# Patient Record
Sex: Female | Born: 1945
Health system: Southern US, Community
[De-identification: ages and names within clinical notes are randomized; demographics above are authoritative.]

## PROBLEM LIST (undated history)

## (undated) DIAGNOSIS — E785 Hyperlipidemia, unspecified: Secondary | ICD-10-CM

## (undated) DIAGNOSIS — J309 Allergic rhinitis, unspecified: Secondary | ICD-10-CM

## (undated) DIAGNOSIS — C760 Malignant neoplasm of head, face and neck: Secondary | ICD-10-CM

## (undated) DIAGNOSIS — J40 Bronchitis, not specified as acute or chronic: Secondary | ICD-10-CM

## (undated) DIAGNOSIS — L570 Actinic keratosis: Secondary | ICD-10-CM

## (undated) DIAGNOSIS — K56609 Unspecified intestinal obstruction, unspecified as to partial versus complete obstruction: Secondary | ICD-10-CM

## (undated) DIAGNOSIS — F419 Anxiety disorder, unspecified: Secondary | ICD-10-CM

## (undated) DIAGNOSIS — R7303 Prediabetes: Secondary | ICD-10-CM

## (undated) DIAGNOSIS — K219 Gastro-esophageal reflux disease without esophagitis: Secondary | ICD-10-CM

## (undated) DIAGNOSIS — J45909 Unspecified asthma, uncomplicated: Secondary | ICD-10-CM

## (undated) DIAGNOSIS — E039 Hypothyroidism, unspecified: Secondary | ICD-10-CM

## (undated) DIAGNOSIS — F341 Dysthymic disorder: Secondary | ICD-10-CM

## (undated) HISTORY — DX: Unspecified intestinal obstruction, unspecified as to partial versus complete obstruction: K56.609

## (undated) HISTORY — DX: Malignant neoplasm of head, face and neck: C76.0

## (undated) HISTORY — DX: Hypothyroidism, unspecified: E03.9

## (undated) HISTORY — DX: Gastro-esophageal reflux disease without esophagitis: K21.9

## (undated) HISTORY — DX: Actinic keratosis: L57.0

## (undated) HISTORY — PX: AUGMENTATION MAMMAPLASTY: SUR837

## (undated) HISTORY — DX: Unspecified asthma, uncomplicated: J45.909

## (undated) HISTORY — DX: Hyperlipidemia, unspecified: E78.5

## (undated) HISTORY — DX: Anxiety disorder, unspecified: F41.9

## (undated) HISTORY — DX: Dysthymic disorder: F34.1

## (undated) HISTORY — DX: Allergic rhinitis, unspecified: J30.9

## (undated) HISTORY — DX: Bronchitis, not specified as acute or chronic: J40

## (undated) HISTORY — DX: Prediabetes: R73.03

## (undated) HISTORY — PX: EYE SURGERY: SHX253

## (undated) HISTORY — PX: TUBAL LIGATION: SHX77

---
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
## 1968-05-11 HISTORY — PX: BREAST SURGERY: SHX581

## 1986-05-11 HISTORY — PX: ABDOMINAL HYSTERECTOMY: SHX81

## 2005-05-11 HISTORY — PX: EYE SURGERY: SHX253

## 2006-06-03 ENCOUNTER — Encounter: Admission: RE | Admit: 2006-06-03 | Discharge: 2006-06-03 | Payer: Self-pay | Admitting: Obstetrics and Gynecology

## 2006-06-16 ENCOUNTER — Encounter: Admission: RE | Admit: 2006-06-16 | Discharge: 2006-06-16 | Payer: Self-pay | Admitting: Obstetrics and Gynecology

## 2006-12-30 DIAGNOSIS — F341 Dysthymic disorder: Secondary | ICD-10-CM | POA: Insufficient documentation

## 2006-12-30 DIAGNOSIS — E7800 Pure hypercholesterolemia, unspecified: Secondary | ICD-10-CM | POA: Insufficient documentation

## 2006-12-30 DIAGNOSIS — Z87891 Personal history of nicotine dependence: Secondary | ICD-10-CM | POA: Insufficient documentation

## 2006-12-30 DIAGNOSIS — K219 Gastro-esophageal reflux disease without esophagitis: Secondary | ICD-10-CM

## 2006-12-30 DIAGNOSIS — Z8249 Family history of ischemic heart disease and other diseases of the circulatory system: Secondary | ICD-10-CM | POA: Insufficient documentation

## 2006-12-30 DIAGNOSIS — F32A Depression, unspecified: Secondary | ICD-10-CM | POA: Insufficient documentation

## 2006-12-30 DIAGNOSIS — M9979 Connective tissue and disc stenosis of intervertebral foramina of abdomen and other regions: Secondary | ICD-10-CM | POA: Insufficient documentation

## 2006-12-30 HISTORY — DX: Gastro-esophageal reflux disease without esophagitis: K21.9

## 2006-12-30 HISTORY — DX: Dysthymic disorder: F34.1

## 2007-01-13 DIAGNOSIS — E039 Hypothyroidism, unspecified: Secondary | ICD-10-CM

## 2007-01-13 HISTORY — DX: Hypothyroidism, unspecified: E03.9

## 2007-07-04 DIAGNOSIS — C760 Malignant neoplasm of head, face and neck: Secondary | ICD-10-CM | POA: Insufficient documentation

## 2007-07-04 HISTORY — DX: Malignant neoplasm of head, face and neck: C76.0

## 2010-10-16 ENCOUNTER — Other Ambulatory Visit: Payer: Self-pay | Admitting: Family Medicine

## 2010-10-16 DIAGNOSIS — Z1231 Encounter for screening mammogram for malignant neoplasm of breast: Secondary | ICD-10-CM

## 2010-10-16 LAB — HM PAP SMEAR: HM PAP: NEGATIVE

## 2010-10-31 ENCOUNTER — Ambulatory Visit
Admission: RE | Admit: 2010-10-31 | Discharge: 2010-10-31 | Disposition: A | Discharge status: Home / Self Care | Payer: PRIVATE HEALTH INSURANCE | Source: Ambulatory Visit | Attending: Family Medicine | Admitting: Family Medicine

## 2010-10-31 DIAGNOSIS — Z1231 Encounter for screening mammogram for malignant neoplasm of breast: Secondary | ICD-10-CM

## 2010-10-31 LAB — HM MAMMOGRAPHY

## 2011-05-12 DIAGNOSIS — C4491 Basal cell carcinoma of skin, unspecified: Secondary | ICD-10-CM

## 2011-05-12 HISTORY — PX: CATARACT EXTRACTION: SUR2

## 2011-05-12 HISTORY — DX: Basal cell carcinoma of skin, unspecified: C44.91

## 2012-02-15 LAB — HM DEXA SCAN

## 2012-05-27 ENCOUNTER — Ambulatory Visit: Payer: Self-pay | Admitting: Unknown Physician Specialty

## 2012-05-27 LAB — HM COLONOSCOPY

## 2013-10-13 LAB — BASIC METABOLIC PANEL
BUN: 19 mg/dL (ref 4–21)
CREATININE: 0.7 mg/dL (ref 0.5–1.1)
GLUCOSE: 85 mg/dL
Potassium: 4.3 mmol/L (ref 3.4–5.3)
Sodium: 141 mmol/L (ref 137–147)

## 2013-10-13 LAB — CBC AND DIFFERENTIAL
HCT: 39 % (ref 36–46)
Hemoglobin: 13 g/dL (ref 12.0–16.0)
PLATELETS: 263 10*3/uL (ref 150–399)
WBC: 3.9 10^3/mL

## 2013-10-13 LAB — TSH: TSH: 0.93 u[IU]/mL (ref 0.41–5.90)

## 2013-10-13 LAB — HEPATIC FUNCTION PANEL
ALT: 17 U/L (ref 7–35)
AST: 27 U/L (ref 13–35)

## 2013-10-27 ENCOUNTER — Ambulatory Visit: Payer: Self-pay | Admitting: Family Medicine

## 2014-09-07 DIAGNOSIS — J45909 Unspecified asthma, uncomplicated: Secondary | ICD-10-CM | POA: Insufficient documentation

## 2014-09-07 DIAGNOSIS — F329 Major depressive disorder, single episode, unspecified: Secondary | ICD-10-CM | POA: Insufficient documentation

## 2014-09-07 DIAGNOSIS — J309 Allergic rhinitis, unspecified: Secondary | ICD-10-CM

## 2014-09-07 DIAGNOSIS — F32A Depression, unspecified: Secondary | ICD-10-CM | POA: Insufficient documentation

## 2014-09-07 DIAGNOSIS — R739 Hyperglycemia, unspecified: Secondary | ICD-10-CM | POA: Insufficient documentation

## 2014-09-07 DIAGNOSIS — I6381 Other cerebral infarction due to occlusion or stenosis of small artery: Secondary | ICD-10-CM | POA: Insufficient documentation

## 2014-09-07 DIAGNOSIS — R7303 Prediabetes: Secondary | ICD-10-CM

## 2014-09-07 HISTORY — DX: Allergic rhinitis, unspecified: J30.9

## 2014-09-07 HISTORY — DX: Unspecified asthma, uncomplicated: J45.909

## 2014-09-07 HISTORY — DX: Prediabetes: R73.03

## 2014-11-06 ENCOUNTER — Encounter: Payer: Self-pay | Admitting: Family Medicine

## 2014-11-06 ENCOUNTER — Ambulatory Visit (INDEPENDENT_AMBULATORY_CARE_PROVIDER_SITE_OTHER): Payer: Commercial Managed Care - HMO | Admitting: Family Medicine

## 2014-11-06 VITALS — BP 112/64 | HR 64 | Temp 98.2°F | Resp 16 | Ht 61.0 in | Wt 114.0 lb

## 2014-11-06 DIAGNOSIS — E039 Hypothyroidism, unspecified: Secondary | ICD-10-CM | POA: Diagnosis not present

## 2014-11-06 DIAGNOSIS — E7800 Pure hypercholesterolemia, unspecified: Secondary | ICD-10-CM

## 2014-11-06 DIAGNOSIS — F329 Major depressive disorder, single episode, unspecified: Secondary | ICD-10-CM

## 2014-11-06 DIAGNOSIS — M810 Age-related osteoporosis without current pathological fracture: Secondary | ICD-10-CM | POA: Insufficient documentation

## 2014-11-06 DIAGNOSIS — M8580 Other specified disorders of bone density and structure, unspecified site: Secondary | ICD-10-CM | POA: Diagnosis not present

## 2014-11-06 DIAGNOSIS — J309 Allergic rhinitis, unspecified: Secondary | ICD-10-CM

## 2014-11-06 DIAGNOSIS — Z0000 Encounter for general adult medical examination without abnormal findings: Secondary | ICD-10-CM | POA: Diagnosis not present

## 2014-11-06 DIAGNOSIS — F32A Depression, unspecified: Secondary | ICD-10-CM

## 2014-11-06 NOTE — Progress Notes (Signed)
Patient ID: Hailey Cain, female   DOB: 03-15-1946, 69 y.o.   MRN: [id number]        Patient: Hailey Cain, Female    DOB: 17-Jan-1946, 69 y.o.   MRN: [id number] Visit Date: 11/06/2014  Today's Provider: Margarita Rana, MD   Chief Complaint  Patient presents with  . Annual Exam   Subjective:    Annual wellness visit Hailey Cain is a 69 y.o. female who presents today for her Subsequent Annual Wellness Visit. She feels fairly well.  Pt reports she has been having trouble with congestion/sinuses for about a month.   She reports exercising about 3 days a week. She reports she is sleeping poorly.  Pt says her husband has been sick and he hasn't been resting well which means she is unable to rest well.    -----------------------------------------------------------   Review of Systems  Constitutional: Negative.   HENT: Positive for congestion and sneezing. Negative for dental problem, drooling, ear discharge, ear pain, facial swelling, hearing loss, mouth sores, nosebleeds, postnasal drip, rhinorrhea, sinus pressure, sore throat, tinnitus, trouble swallowing and voice change.   Eyes: Negative.   Respiratory: Positive for cough. Negative for apnea, choking, chest tightness, shortness of breath, wheezing and stridor.   Cardiovascular: Negative.   Gastrointestinal: Positive for constipation. Negative for nausea, vomiting, abdominal pain, diarrhea, blood in stool, abdominal distention, anal bleeding and rectal pain.  Endocrine: Negative.   Genitourinary: Negative.   Musculoskeletal: Negative.   Skin: Negative.   Allergic/Immunologic: Negative.   Neurological: Negative.   Hematological: Negative.   Psychiatric/Behavioral: Negative.     History   Social History  . Marital Status: Married    Spouse Name: Hailey Cain  . Number of Children: 1  . Years of Education: H/S   Occupational History  . Social research officer, government.     Part-Time but sometimes full-time   Social  History Main Topics  . Smoking status: Former Smoker -- 1.00 packs/day for 20 years    Quit date: 05/12/2003  . Smokeless tobacco: Never Used  . Alcohol Use: No  . Drug Use: No  . Sexual Activity: Not on file   Other Topics Concern  . Not on file   Social History Narrative    Patient Active Problem List   Diagnosis Date Noted  . Allergic rhinitis 09/07/2014  . Clinical depression 09/07/2014  . Blood glucose elevated 09/07/2014  . Lacunar infarction 09/07/2014  . RAD (reactive airway disease) 09/07/2014  . Cancer of cheek 07/04/2007  . Adult hypothyroidism 01/13/2007  . Narrowing of intervertebral disc space 12/30/2006  . Depression, neurotic 12/30/2006  . Acid reflux 12/30/2006  . Fam hx-ischem heart disease 12/30/2006  . History of tobacco use 12/30/2006  . Hypercholesterolemia without hypertriglyceridemia 12/30/2006    Past Surgical History  Procedure Laterality Date  . Eye surgery      cataract extraction  . Eye surgery  2007    LASIK  . Abdominal hysterectomy  1988    heavy, irregular menses; ovaries intact  . Breast surgery  1970    augmentation  . Tonsillectomy and adenoidectomy  1950    Her family history includes CAD in her brother and mother; Cancer in her brother; Heart Problems in her brother; Heart attack in her father; Hyperthyroidism in her sister; Stroke in her mother.    Previous Medications   ASPIRIN 81 MG TABLET    Take by mouth.   CHOLECALCIFEROL (VITAMIN D) 400 UNITS TABS TABLET    Take  by mouth.   CYANOCOBALAMIN 100 MCG TABLET    Take by mouth.   ESOMEPRAZOLE MAGNESIUM (NEXIUM 24HR PO)       FLUOXETINE (PROZAC) 40 MG CAPSULE    Take by mouth.   LEVOTHYROXINE (SYNTHROID, LEVOTHROID) 75 MCG TABLET    Take by mouth.   LORATADINE (CLARITIN) 10 MG TABLET    Take by mouth.   MOMETASONE (NASONEX) 50 MCG/ACT NASAL SPRAY    Place into the nose.   MONTELUKAST (SINGULAIR) 10 MG TABLET    Take by mouth.   SIMVASTATIN (ZOCOR) 40 MG TABLET    Take by  mouth.    Patient Care Team: Margarita Rana, MD as PCP - General (Family Medicine)     Objective:   Vitals: BP 112/64 mmHg  Pulse 64  Temp(Src) 98.2 F (36.8 C) (Oral)  Resp 16  Ht 5\' 1"  (1.549 m)  Wt 114 lb (51.71 kg)  BMI 21.55 kg/m2  Physical Exam  Constitutional: She is oriented to person, place, and time. She appears well-developed and well-nourished.  HENT:  Head: Normocephalic and atraumatic.  Right Ear: Hearing, tympanic membrane, external ear and ear canal normal.  Left Ear: Hearing, tympanic membrane, external ear and ear canal normal.  Nose: Nose normal.  Mouth/Throat: Uvula is midline and oropharynx is clear and moist.  Eyes: Conjunctivae, EOM and lids are normal. Pupils are equal, round, and reactive to light.  Neck: Trachea normal and normal range of motion. Carotid bruit is not present.  Cardiovascular: Normal rate, regular rhythm, normal heart sounds and intact distal pulses.   Pulmonary/Chest: Effort normal and breath sounds normal. Right breast exhibits no inverted nipple, no mass, no nipple discharge, no skin change and no tenderness. Left breast exhibits no inverted nipple, no mass, no nipple discharge, no skin change and no tenderness. Breasts are symmetrical.  Bilateral breast implants noted.    Abdominal: Soft. Normal appearance and bowel sounds are normal.  Musculoskeletal: Normal range of motion.  Neurological: She is alert and oriented to person, place, and time.  Skin: Skin is warm, dry and intact.  Psychiatric: She has a normal mood and affect. Judgment normal. Cognition and memory are normal.    Activities of Daily Living In your present state of health, do you have any difficulty performing the following activities: 11/06/2014  Hearing? N  Vision? N  Difficulty concentrating or making decisions? N  Walking or climbing stairs? N  Dressing or bathing? N  Doing errands, shopping? N    Fall Risk Assessment Fall Risk  11/06/2014  Falls in the  past year? No     Depression Screen No flowsheet data found.  Cognitive Testing - 6-CIT  Correct? Score   What year is it? yes 0 0 or 4  What month is it? yes 0 0 or 3  Memorize:    Hailey Cain,  42,  [place],      What time is it? (within 1 hour) yes 0 0 or 3  Count backwards from 20 yes 0 0, 2, or 4  Name the months of the year yes 0 0, 2, or 4  Repeat name & address above no 3 0, 2, 4, 6, 8, or 10       TOTAL SCORE  3/28   Interpretation:  Normal  Normal (0-7) Abnormal (8-28)       Assessment & Plan:     Annual Wellness Visit  Reviewed patient's Family Medical History Reviewed and updated list  of patient's medical providers Assessment of cognitive impairment was done Assessed patient's functional ability Established a written schedule for health screening [place] Completed and Reviewed  Exercise Activities and Dietary recommendations Goals    None      Immunization History  Administered Date(s) Administered  . Tdap 10/16/2010    Health Maintenance  Topic Date Due  . ZOSTAVAX  12/16/2005  . PNA vac Low Risk Adult (1 of 2 - PCV13) 12/17/2010  . MAMMOGRAM  10/30/2012  . INFLUENZA VACCINE  12/10/2014  . TETANUS/TDAP  10/15/2020  . COLONOSCOPY  05/27/2022  . DEXA SCAN  Completed      Discussed health benefits of physical activity, and encouraged her to engage in regular exercise appropriate for her age and condition.    ------------------------------------------------------------------------------------------------------------  Patient was seen and examined by Jerrell Belfast, MD, and note scribed by Ashley Royalty, [place].   I have reviewed the document for accuracy and completeness and I agree with above. Jerrell Belfast, MD    Margarita Rana, MD

## 2014-11-07 DIAGNOSIS — E780 Pure hypercholesterolemia: Secondary | ICD-10-CM | POA: Diagnosis not present

## 2014-11-07 DIAGNOSIS — J309 Allergic rhinitis, unspecified: Secondary | ICD-10-CM | POA: Diagnosis not present

## 2014-11-07 DIAGNOSIS — F329 Major depressive disorder, single episode, unspecified: Secondary | ICD-10-CM | POA: Diagnosis not present

## 2014-11-08 LAB — CBC WITH DIFFERENTIAL/PLATELET
Basophils Absolute: 0 10*3/uL (ref 0.0–0.2)
Basos: 1 %
EOS (ABSOLUTE): 0.1 10*3/uL (ref 0.0–0.4)
Eos: 3 %
HEMATOCRIT: 36.1 % (ref 34.0–46.6)
Hemoglobin: 12.1 g/dL (ref 11.1–15.9)
IMMATURE GRANS (ABS): 0 10*3/uL (ref 0.0–0.1)
Immature Granulocytes: 0 %
LYMPHS ABS: 1.5 10*3/uL (ref 0.7–3.1)
LYMPHS: 45 %
MCH: 30.8 pg (ref 26.6–33.0)
MCHC: 33.5 g/dL (ref 31.5–35.7)
MCV: 92 fL (ref 79–97)
MONOCYTES: 7 %
Monocytes Absolute: 0.2 10*3/uL (ref 0.1–0.9)
NEUTROS PCT: 44 %
Neutrophils Absolute: 1.6 10*3/uL (ref 1.4–7.0)
PLATELETS: 212 10*3/uL (ref 150–379)
RBC: 3.93 x10E6/uL (ref 3.77–5.28)
RDW: 13.8 % (ref 12.3–15.4)
WBC: 3.4 10*3/uL (ref 3.4–10.8)

## 2014-11-08 LAB — COMPREHENSIVE METABOLIC PANEL
ALT: 10 IU/L (ref 0–32)
AST: 21 IU/L (ref 0–40)
Albumin/Globulin Ratio: 2 (ref 1.1–2.5)
Albumin: 4.2 g/dL (ref 3.6–4.8)
Alkaline Phosphatase: 34 IU/L — ABNORMAL LOW (ref 39–117)
BUN/Creatinine Ratio: 20 (ref 11–26)
BUN: 16 mg/dL (ref 8–27)
CO2: 24 mmol/L (ref 18–29)
Calcium: 8.7 mg/dL (ref 8.7–10.3)
Chloride: 103 mmol/L (ref 97–108)
Creatinine, Ser: 0.79 mg/dL (ref 0.57–1.00)
GFR calc non Af Amer: 77 mL/min/{1.73_m2} (ref >59)
GFR, EST AFRICAN AMERICAN: 89 mL/min/{1.73_m2} (ref >59)
GLOBULIN, TOTAL: 2.1 g/dL (ref 1.5–4.5)
Glucose: 90 mg/dL (ref 65–99)
Potassium: 4.1 mmol/L (ref 3.5–5.2)
Sodium: 142 mmol/L (ref 134–144)
TOTAL PROTEIN: 6.3 g/dL (ref 6.0–8.5)

## 2014-11-08 LAB — LIPID PANEL
Chol/HDL Ratio: 2.5 ratio units (ref 0.0–4.4)
Cholesterol, Total: 170 mg/dL (ref 100–199)
HDL: 68 mg/dL (ref >39)
LDL CALC: 89 mg/dL (ref 0–99)
TRIGLYCERIDES: 66 mg/dL (ref 0–149)
VLDL Cholesterol Cal: 13 mg/dL (ref 5–40)

## 2014-11-08 LAB — TSH: TSH: 0.328 u[IU]/mL — AB (ref 0.450–4.500)

## 2014-11-09 ENCOUNTER — Telehealth: Payer: Self-pay

## 2014-11-09 NOTE — Telephone Encounter (Signed)
-----   Message from Margarita Rana, MD sent at 11/08/2014 11:13 AM EDT ----- Labs stable except thyroid is very slightly over corrected. If feels ok, would call for recheck of tsh and t4 in 8 weeks. If feels anxious, palpitations or just not right, could consider decreasing dose. Thanks.

## 2014-11-09 NOTE — Telephone Encounter (Signed)
Pt is returning call.  CB#[phone]/MJ

## 2014-11-09 NOTE — Telephone Encounter (Signed)
Tried calling but pt's vm is not set up.  11/09/2014  Thanks,   -Mickel Baas

## 2014-11-13 NOTE — Telephone Encounter (Signed)
Pt advised as directed below.  She would like to hold off on changing the dose for right now.  She will call in 8 weeks to get her labs, but she will call back sooner if she starts having any symptoms.  Thanks,   -Mickel Baas

## 2014-11-14 ENCOUNTER — Ambulatory Visit: Payer: PRIVATE HEALTH INSURANCE | Attending: Family Medicine

## 2014-12-03 ENCOUNTER — Telehealth: Payer: Self-pay | Admitting: Family Medicine

## 2014-12-03 NOTE — Telephone Encounter (Signed)
Pt states she is having nervousness and anxiety.  Pt is asking if her medications can be changed to help with this.  [place]  CB#[phone]/MJ

## 2014-12-03 NOTE — Telephone Encounter (Signed)
Needs ov to address.

## 2014-12-03 NOTE — Telephone Encounter (Signed)
LMTCB 12/03/2014  Thanks,   -Mickel Baas

## 2014-12-04 NOTE — Telephone Encounter (Signed)
Pt returning call.  CB#[phone]/MW

## 2014-12-10 ENCOUNTER — Encounter: Payer: Self-pay | Admitting: Family Medicine

## 2014-12-10 ENCOUNTER — Ambulatory Visit (INDEPENDENT_AMBULATORY_CARE_PROVIDER_SITE_OTHER): Payer: Commercial Managed Care - HMO | Admitting: Family Medicine

## 2014-12-10 VITALS — BP 130/60 | HR 66 | Temp 97.9°F | Resp 16 | Wt 110.0 lb

## 2014-12-10 DIAGNOSIS — E039 Hypothyroidism, unspecified: Secondary | ICD-10-CM | POA: Diagnosis not present

## 2014-12-10 DIAGNOSIS — F329 Major depressive disorder, single episode, unspecified: Secondary | ICD-10-CM | POA: Diagnosis not present

## 2014-12-10 DIAGNOSIS — F32A Depression, unspecified: Secondary | ICD-10-CM

## 2014-12-10 DIAGNOSIS — F419 Anxiety disorder, unspecified: Secondary | ICD-10-CM

## 2014-12-10 HISTORY — DX: Anxiety disorder, unspecified: F41.9

## 2014-12-10 MED ORDER — LEVOTHYROXINE SODIUM 50 MCG PO TABS: 50.0000 ug | ORAL_TABLET | Freq: Every day | ORAL | Status: DC

## 2014-12-10 NOTE — Progress Notes (Signed)
Patient: Hailey Cain Female    DOB: 1946-02-22   69 y.o.   MRN: [id number] Visit Date: 12/10/2014  Today's Provider: Margarita Rana, MD   Chief Complaint  Patient presents with  . Anxiety        Subjective:    Anxiety Presents for follow-up visit. Onset was 1 to 4 weeks ago. The problem has been unchanged. Symptoms include chest pain, depressed mood (sometimes), dizziness, dry mouth, excessive worry (husband sick and patient states that makes her feel depress), irritability (sometimes), muscle tension (in jaw area), nervous/anxious behavior and palpitations (can feel heart beats faster when gets nervous and anxious). Patient reports no compulsions, confusion, decreased concentration, feeling of choking, insomnia, malaise, nausea, obsessions, panic, restlessness, shortness of breath or suicidal ideas. Symptoms occur constantly. Duration: off and on all day. The severity of symptoms is moderate. The symptoms are aggravated by work stress. The quality of sleep is fair. Nighttime awakenings: several.   There are no known risk factors. Her past medical history is significant for anxiety/panic attacks. The treatment provided mild relief. Compliance with prior treatments has been good.    Concerned that she may need her thyroid reduced. Work has been very successful.      No Known Allergies Previous Medications   ASPIRIN 81 MG TABLET    Take by mouth.   CHOLECALCIFEROL (VITAMIN D) 400 UNITS TABS TABLET    Take by mouth.   CYANOCOBALAMIN 100 MCG TABLET    Take by mouth.   ESOMEPRAZOLE MAGNESIUM (NEXIUM 24HR PO)       FLUOXETINE (PROZAC) 40 MG CAPSULE    Take by mouth.   LEVOTHYROXINE (SYNTHROID, LEVOTHROID) 75 MCG TABLET    Take by mouth.   LORATADINE (CLARITIN) 10 MG TABLET    Take by mouth.   MOMETASONE (NASONEX) 50 MCG/ACT NASAL SPRAY    Place into the nose.   MONTELUKAST (SINGULAIR) 10 MG TABLET    Take by mouth.   SIMVASTATIN (ZOCOR) 40 MG TABLET    Take by mouth.    Review  of Systems  Constitutional: Positive for irritability (sometimes).  Respiratory: Negative for shortness of breath.   Cardiovascular: Positive for chest pain and palpitations (can feel heart beats faster when gets nervous and anxious).  Gastrointestinal: Negative for nausea.  Neurological: Positive for dizziness.  Psychiatric/Behavioral: Negative for suicidal ideas, confusion and decreased concentration. The patient is nervous/anxious. The patient does not have insomnia.     History  Substance Use Topics  . Smoking status: Former Smoker -- 1.00 packs/day for 20 years    Quit date: 05/12/2003  . Smokeless tobacco: Never Used  . Alcohol Use: No   Objective:   There were no vitals taken for this visit. Family History  Problem Relation Age of Onset  . Stroke Mother   . CAD Mother   . Heart attack Father   . Hyperthyroidism Sister   . Heart Problems Brother   . Cancer Brother     skin  . CAD Brother     CABG   Past Surgical History  Procedure Laterality Date  . Eye surgery      cataract extraction  . Eye surgery  2007    LASIK  . Abdominal hysterectomy  1988    heavy, irregular menses; ovaries intact  . Breast surgery  1970    augmentation  . Tonsillectomy and adenoidectomy  1950   Patient Active Problem List   Diagnosis Date Noted  .  Osteopenia 11/06/2014  . Allergic rhinitis 09/07/2014  . Clinical depression 09/07/2014  . Blood glucose elevated 09/07/2014  . Lacunar infarction 09/07/2014  . RAD (reactive airway disease) 09/07/2014  . Cancer of cheek 07/04/2007  . Adult hypothyroidism 01/13/2007  . Narrowing of intervertebral disc space 12/30/2006  . Depression, neurotic 12/30/2006  . Acid reflux 12/30/2006  . Fam hx-ischem heart disease 12/30/2006  . History of tobacco use 12/30/2006  . Hypercholesterolemia without hypertriglyceridemia 12/30/2006   Depression screen [facility] 2/9 12/10/2014 11/06/2014  Decreased Interest 1 0  Down, Depressed, Hopeless 1 0  PHQ - 2  Score 2 0  Altered sleeping 1 -  Tired, decreased energy 1 -  Change in appetite 0 -  Feeling bad or failure about yourself  1 -  Trouble concentrating 1 -  Moving slowly or fidgety/restless 1 -  Suicidal thoughts 0 -  PHQ-9 Score 7 -  Difficult doing work/chores Somewhat difficult -     Physical Exam  Constitutional: She is oriented to person, place, and time. She appears well-developed and well-nourished.  Cardiovascular: Normal rate and regular rhythm.   Pulmonary/Chest: Effort normal.  Neurological: She is alert and oriented to person, place, and time.  Psychiatric: She has a normal mood and affect. Her behavior is normal. Judgment and thought content normal.   BP 130/60 mmHg  Pulse 66  Temp(Src) 97.9 F (36.6 C) (Oral)  Resp 16  Wt 110 lb (49.896 kg)      Assessment & Plan:     1. Anxiety May be related to thyroid being overcorrected.  Will decrease dose and recheck labs in 6 weeks. Call sooner if worsens or does not improve.   2. Depression Stable.  3. Hypothyroidism, unspecified hypothyroidism type As above.        Margarita Rana, MD  [facility]

## 2015-01-27 ENCOUNTER — Other Ambulatory Visit: Payer: Self-pay | Admitting: Family Medicine

## 2015-01-27 DIAGNOSIS — E7800 Pure hypercholesterolemia, unspecified: Secondary | ICD-10-CM

## 2015-02-11 ENCOUNTER — Telehealth: Payer: Self-pay

## 2015-02-11 DIAGNOSIS — E039 Hypothyroidism, unspecified: Secondary | ICD-10-CM

## 2015-02-11 NOTE — Telephone Encounter (Signed)
Pt requesting lab order for t4 and tsh. Lab order printed.

## 2015-02-12 ENCOUNTER — Telehealth: Payer: Self-pay

## 2015-02-12 LAB — T4 AND TSH
T4 TOTAL: 4.5 ug/dL (ref 4.5–12.0)
TSH: 8.93 u[IU]/mL — AB (ref 0.450–4.500)

## 2015-02-12 NOTE — Telephone Encounter (Signed)
LMTCB 02/12/2015  Thanks,   -Mickel Baas

## 2015-02-12 NOTE — Telephone Encounter (Signed)
-----   Message from Margarita Rana, MD sent at 02/12/2015  7:03 AM EDT ----- Please see how patient is feeling. Now thyroid look a little low. Please see if has any fatigue, cold intolerance. Unclear if needs to adjust again. Thanks.

## 2015-02-14 NOTE — Telephone Encounter (Signed)
Pt advised; she reports that she is feeling fine.  Does she need to recheck it?  Thanks,   -Mickel Baas

## 2015-02-14 NOTE — Telephone Encounter (Signed)
Unable to leave message, will try again later.

## 2015-02-14 NOTE — Telephone Encounter (Signed)
Pt called back and I have made her an appt for 05/16/14 @ 8 am.

## 2015-02-14 NOTE — Telephone Encounter (Signed)
3 months. Thanks.

## 2015-03-02 ENCOUNTER — Other Ambulatory Visit: Payer: Self-pay | Admitting: Family Medicine

## 2015-03-02 DIAGNOSIS — F419 Anxiety disorder, unspecified: Secondary | ICD-10-CM

## 2015-03-02 DIAGNOSIS — F341 Dysthymic disorder: Secondary | ICD-10-CM

## 2015-03-04 NOTE — Telephone Encounter (Signed)
LOV 12/10/2014. Renaldo Fiddler, CMA

## 2015-05-17 ENCOUNTER — Encounter: Payer: Self-pay | Admitting: Family Medicine

## 2015-05-17 ENCOUNTER — Ambulatory Visit (INDEPENDENT_AMBULATORY_CARE_PROVIDER_SITE_OTHER): Payer: Medicare HMO | Admitting: Family Medicine

## 2015-05-17 VITALS — BP 120/72 | HR 76 | Temp 98.2°F | Resp 20 | Wt 114.0 lb

## 2015-05-17 DIAGNOSIS — J40 Bronchitis, not specified as acute or chronic: Secondary | ICD-10-CM | POA: Diagnosis not present

## 2015-05-17 DIAGNOSIS — R69 Illness, unspecified: Secondary | ICD-10-CM | POA: Diagnosis not present

## 2015-05-17 DIAGNOSIS — F341 Dysthymic disorder: Secondary | ICD-10-CM | POA: Diagnosis not present

## 2015-05-17 DIAGNOSIS — F32A Depression, unspecified: Secondary | ICD-10-CM

## 2015-05-17 DIAGNOSIS — J309 Allergic rhinitis, unspecified: Secondary | ICD-10-CM | POA: Diagnosis not present

## 2015-05-17 DIAGNOSIS — F419 Anxiety disorder, unspecified: Secondary | ICD-10-CM

## 2015-05-17 DIAGNOSIS — E039 Hypothyroidism, unspecified: Secondary | ICD-10-CM | POA: Diagnosis not present

## 2015-05-17 DIAGNOSIS — F329 Major depressive disorder, single episode, unspecified: Secondary | ICD-10-CM | POA: Diagnosis not present

## 2015-05-17 HISTORY — DX: Bronchitis, not specified as acute or chronic: J40

## 2015-05-17 MED ORDER — FLUOXETINE HCL 20 MG PO TABS
60.0000 mg | ORAL_TABLET | Freq: Every day | ORAL | Status: DC
Start: 2015-05-17 — End: 2016-02-12

## 2015-05-17 MED ORDER — LORATADINE 10 MG PO TABS: 10.0000 mg | ORAL_TABLET | Freq: Every day | ORAL | Status: DC

## 2015-05-17 MED ORDER — MONTELUKAST SODIUM 10 MG PO TABS: 10.0000 mg | ORAL_TABLET | Freq: Every day | ORAL | Status: DC

## 2015-05-17 MED ORDER — AZITHROMYCIN 250 MG PO TABS: ORAL_TABLET | ORAL | Status: DC

## 2015-05-17 NOTE — Progress Notes (Signed)
Subjective:    Patient ID: Cleatrice Burke, female    DOB: 01-09-1946, 70 y.o.   MRN: [id number]  Anxiety Presents for follow-up (Last seen 12/10/2014. PCP was concerned that it could be secondary to overcorrected thyroid. PCP decreased Levothyroxine.) visit. The problem has been gradually improving. Symptoms include excessive worry and nervous/anxious behavior. Patient reports no chest pain, decreased concentration, depressed mood, insomnia, irritability, malaise, palpitations, panic, restlessness, shortness of breath or suicidal ideas. Primary symptoms comment: pt reports she does not feel like she has time for her self as she takes care of her sick husband. The symptoms are aggravated by family issues (Husband's health issues).   Past treatments include SSRIs (Prozac 40 mg). The treatment provided mild relief. Compliance with prior treatments has been good.  Cough This is a new problem. The current episode started 1 to 4 weeks ago (x 2 weeks). The problem has been waxing and waning. The cough is productive of sputum (yellow). Associated symptoms include ear congestion, headaches, nasal congestion, rhinorrhea and a sore throat. Pertinent negatives include no chest pain, chills, ear pain, fever, hemoptysis, postnasal drip, shortness of breath, sweats, weight loss or wheezing. Treatments tried: Alka Seltzer Sinus, Mucinex. The treatment provided mild relief.   Hypothyroidism: Patient presents for evaluation of thyroid function. Symptoms consist of fatigue, anxiousness. Symptoms have present for several years. The symptoms are moderate.  The problem has been gradually improving after decreasing dose.  Previous thyroid studies include TSH and total T4. The hypothyroidism is due to hypothyroidism.     Review of Systems  Constitutional: Negative for fever, chills, weight loss and irritability.  HENT: Positive for rhinorrhea, sore throat and voice change. Negative for ear pain and postnasal drip.     Respiratory: Positive for cough. Negative for hemoptysis, shortness of breath and wheezing.   Cardiovascular: Negative for chest pain and palpitations.  Neurological: Positive for headaches.  Psychiatric/Behavioral: Negative for suicidal ideas and decreased concentration. The patient is nervous/anxious. The patient does not have insomnia.    BP 120/72 mmHg  Pulse 76  Temp(Src) 98.2 F (36.8 C) (Oral)  Resp 20  Wt 114 lb (51.71 kg)  SpO2 96%   Patient Active Problem List   Diagnosis Date Noted  . Anxiety 12/10/2014  . Osteopenia 11/06/2014  . Allergic rhinitis 09/07/2014  . Clinical depression 09/07/2014  . Blood glucose elevated 09/07/2014  . Lacunar infarction ([place]) 09/07/2014  . RAD (reactive airway disease) 09/07/2014  . Cancer of cheek ([place]) 07/04/2007  . Adult hypothyroidism 01/13/2007  . Narrowing of intervertebral disc space 12/30/2006  . Depression, neurotic 12/30/2006  . Acid reflux 12/30/2006  . Fam hx-ischem heart disease 12/30/2006  . History of tobacco use 12/30/2006  . Hypercholesterolemia without hypertriglyceridemia 12/30/2006   No past medical history on file. Current Outpatient Prescriptions on File Prior to Visit  Medication Sig  . aspirin 81 MG tablet Take by mouth.  . cholecalciferol (VITAMIN D) 400 UNITS TABS tablet Take by mouth.  . cyanocobalamin 100 MCG tablet Take by mouth.  . Esomeprazole Magnesium (NEXIUM 24HR PO)   . FLUoxetine (PROZAC) 40 MG capsule TAKE ONE CAPSULE BY MOUTH ONCE DAILY  . levothyroxine (SYNTHROID) 50 MCG tablet Take 1 tablet (50 mcg total) by mouth daily before breakfast.  . simvastatin (ZOCOR) 40 MG tablet TAKE ONE TABLET BY MOUTH ONCE DAILY  . loratadine (CLARITIN) 10 MG tablet Take by mouth. Reported on 05/17/2015  . mometasone (NASONEX) 50 MCG/ACT nasal spray Place into the nose. Reported  on 05/17/2015  . montelukast (SINGULAIR) 10 MG tablet Take by mouth. Reported on 05/17/2015   No current facility-administered medications  on file prior to visit.   No Known Allergies Past Surgical History  Procedure Laterality Date  . Eye surgery      cataract extraction  . Eye surgery  2007    LASIK  . Abdominal hysterectomy  1988    heavy, irregular menses; ovaries intact  . Breast surgery  1970    augmentation  . Tonsillectomy and adenoidectomy  1950   Social History   Social History  . Marital Status: Married    Spouse Name: Laban Emperor  . Number of Children: 1  . Years of Education: H/S   Occupational History  . Social research officer, government.     Part-Time but sometimes full-time   Social History Main Topics  . Smoking status: Former Smoker -- 1.00 packs/day for 20 years    Quit date: 05/12/2003  . Smokeless tobacco: Never Used  . Alcohol Use: No  . Drug Use: No  . Sexual Activity: Not on file   Other Topics Concern  . Not on file   Social History Narrative   Family History  Problem Relation Age of Onset  . Stroke Mother   . CAD Mother   . Heart attack Father   . Hyperthyroidism Sister   . Heart Problems Brother   . Cancer Brother     skin  . CAD Brother     CABG      Objective:   Physical Exam  Constitutional: She is oriented to person, place, and time. She appears well-developed and well-nourished.  HENT:  Right Ear: External ear normal.  Left Ear: External ear normal.  Mouth/Throat: Oropharynx is clear and moist.  Pale turbinates  Cardiovascular: Normal rate and regular rhythm.   Pulmonary/Chest: Effort normal and breath sounds normal.  Neurological: She is alert and oriented to person, place, and time.  Psychiatric: She has a normal mood and affect. Her behavior is normal. Judgment and thought content normal.  BP 120/72 mmHg  Pulse 76  Temp(Src) 98.2 F (36.8 C) (Oral)  Resp 20  Wt 114 lb (51.71 kg)  SpO2 96%     Assessment & Plan:  1. Allergic rhinitis, unspecified allergic rhinitis type Will restart medication.   - loratadine (CLARITIN) 10 MG tablet; Take 1 tablet (10  mg total) by mouth daily. Reported on 05/17/2015  Dispense: 30 tablet; Refill: 5 - montelukast (SINGULAIR) 10 MG tablet; Take 1 tablet (10 mg total) by mouth at bedtime. Reported on 05/17/2015  Dispense: 30 tablet; Refill: 5  2. Hypothyroidism, unspecified hypothyroidism type Stable. Continue current medication.    3. Clinical depression Not as goal.  Will increase as below.    4. Depression, neurotic Will increase Fluoxetine to 60 mg daily secondary to stress related to her husband. He made need a lung transplant. His health is very bad.  - FLUoxetine (PROZAC) 20 MG tablet; Take 3 tablets (60 mg total) by mouth daily.  Dispense: 90 tablet; Refill: 3  5. Anxiety As noted.   - FLUoxetine (PROZAC) 20 MG tablet; Take 3 tablets (60 mg total) by mouth daily.  Dispense: 90 tablet; Refill: 3  6. Bronchitis. New problem. Will start medication.  Further plan pending this treatment. Patient instructed to call back if condition worsens or does not improve.    - azithromycin (ZITHROMAX) 250 MG tablet; 2 today and then one a day for 4 more days.  Dispense: 6 tablet; Refill: 0   Patient was seen and examined by Jerrell Belfast, MD, and note scribed by Renaldo Fiddler, CMA. I have reviewed the document for accuracy and completeness and I agree with above. Jerrell Belfast, MD     Margarita Rana, MD

## 2015-05-28 ENCOUNTER — Other Ambulatory Visit: Payer: Self-pay | Admitting: Family Medicine

## 2015-05-28 MED ORDER — LEVOTHYROXINE SODIUM 50 MCG PO TABS: 50.0000 ug | ORAL_TABLET | Freq: Every day | ORAL | Status: DC

## 2015-05-28 NOTE — Telephone Encounter (Signed)
Pt contacted office for refill request on the following medications: levothyroxine (SYNTHROID) 50 MCG tablet to [facility] in 90 day supply. Thanks TNP

## 2015-05-28 NOTE — Telephone Encounter (Signed)
Patient was last seen on 05/17/2015. Med was last filled in 12/10/2014 #90 RFx3. Last TSH was checked on 02/11/2015 and was 8.930. Was patient supposed to have this rechecked on her 05/17/2015 visit? Just asking before we sent in medication just incase you wanted to adjust dose. Thanks!

## 2015-06-27 ENCOUNTER — Telehealth: Payer: Self-pay | Admitting: Family Medicine

## 2015-06-27 MED ORDER — AMOXICILLIN-POT CLAVULANATE 875-125 MG PO TABS: 1.0000 | ORAL_TABLET | Freq: Two times a day (BID) | ORAL | Status: DC

## 2015-06-27 NOTE — Telephone Encounter (Signed)
Sent new antibiotic.   Patient instructed to call back if condition worsens or does not improve.

## 2015-06-27 NOTE — Telephone Encounter (Signed)
Advised pt as below. Emily Drozdowski, CMA  

## 2015-06-27 NOTE — Telephone Encounter (Signed)
Pt started taking azithromycin (ZITHROMAX) 250 MG tablet on 05/17/15. Pt stated that she is still coughing and has congestion. Pt would like another round or try something else. Pt would like RX sent to [facility]. Thanks TNP

## 2015-07-04 DIAGNOSIS — E785 Hyperlipidemia, unspecified: Secondary | ICD-10-CM | POA: Diagnosis not present

## 2015-07-04 DIAGNOSIS — E039 Hypothyroidism, unspecified: Secondary | ICD-10-CM | POA: Diagnosis not present

## 2015-07-04 DIAGNOSIS — R69 Illness, unspecified: Secondary | ICD-10-CM | POA: Diagnosis not present

## 2015-07-22 ENCOUNTER — Ambulatory Visit (INDEPENDENT_AMBULATORY_CARE_PROVIDER_SITE_OTHER): Payer: Medicare HMO | Admitting: Family Medicine

## 2015-07-22 ENCOUNTER — Encounter: Payer: Self-pay | Admitting: Family Medicine

## 2015-07-22 VITALS — BP 110/70 | HR 66 | Temp 98.2°F | Resp 16 | Ht 61.0 in | Wt 112.0 lb

## 2015-07-22 DIAGNOSIS — R739 Hyperglycemia, unspecified: Secondary | ICD-10-CM | POA: Diagnosis not present

## 2015-07-22 DIAGNOSIS — Z0000 Encounter for general adult medical examination without abnormal findings: Secondary | ICD-10-CM

## 2015-07-22 DIAGNOSIS — E039 Hypothyroidism, unspecified: Secondary | ICD-10-CM

## 2015-07-22 DIAGNOSIS — R1011 Right upper quadrant pain: Secondary | ICD-10-CM

## 2015-07-22 DIAGNOSIS — E7800 Pure hypercholesterolemia, unspecified: Secondary | ICD-10-CM | POA: Diagnosis not present

## 2015-07-22 NOTE — Progress Notes (Addendum)
Patient ID: [name], female   DOB: [birth year], 70 y.o.   MRN: [id number]       Patient: Hailey Cain, Female    DOB: Jan 13, 1946, 70 y.o.   MRN: [id number] Visit Date: 07/22/2015  Today's Provider: Margarita Rana, MD   Chief Complaint  Patient presents with  . Medicare Wellness   Subjective:    Annual wellness visit Hailey Cain is a 70 y.o. female. She feels well. She reports exercising 2-3 days a week. She reports she is sleeping well.  Does have some congestion. Not taking her Claritin right now.    02/03/13 CPE 10/16/10 Pap-neg 10/31/10 Mammogram-BI-RADS 1 05/27/12 Colonoscopy-internal hemorrhoids, recheck in 10 yrs. Dr. Vira Agar 02/15/12 BMD-Normal  Lab Results  Component Value Date   WBC 3.4 11/07/2014   HGB 13.0 10/13/2013   HCT 36.1 11/07/2014   PLT 212 11/07/2014   GLUCOSE 90 11/07/2014   CHOL 170 11/07/2014   TRIG 66 11/07/2014   HDL 68 11/07/2014   LDLCALC 89 11/07/2014   ALT 10 11/07/2014   AST 21 11/07/2014   NA 142 11/07/2014   K 4.1 11/07/2014   CL 103 11/07/2014   CREATININE 0.79 11/07/2014   BUN 16 11/07/2014   CO2 24 11/07/2014   TSH 8.930* 02/11/2015    ----------------------------------------------------------- Chest pain: Patient reports that she has been having chest pain in midchest. Patient relates her pain to not watching her diet. Patient reports pain radiates to her back. Patient denies heartburn or reflux. Patient reports that she had similar symptoms years ago and was told it was her gallbladder and to watch her diet. Has been ok until recently . Also taking her medication without difficulty. Chronic problems are stable. Needs labs done.    Review of Systems  Constitutional: Negative.   HENT: Positive for congestion, ear pain, hearing loss and sneezing.   Eyes: Negative.   Respiratory: Negative.   Cardiovascular: Positive for chest pain.  Gastrointestinal: Negative.   Endocrine: Negative.   Genitourinary: Negative.     Musculoskeletal: Negative.   Skin: Negative.   Allergic/Immunologic: Negative.   Neurological: Negative.   Hematological: Negative.   Psychiatric/Behavioral: Negative.     Social History   Social History  . Marital Status: Married    Spouse Name: Laban Emperor  . Number of Children: 1  . Years of Education: H/S   Occupational History  . Social research officer, government.     Part-Time but sometimes full-time   Social History Main Topics  . Smoking status: Former Smoker -- 1.00 packs/day for 20 years    Quit date: 05/12/2003  . Smokeless tobacco: Never Used  . Alcohol Use: No  . Drug Use: No  . Sexual Activity: Not on file   Other Topics Concern  . Not on file   Social History Narrative    History reviewed. No pertinent past medical history.   Patient Active Problem List   Diagnosis Date Noted  . Bronchitis 05/17/2015  . Anxiety 12/10/2014  . Osteopenia 11/06/2014  . Allergic rhinitis 09/07/2014  . Blood glucose elevated 09/07/2014  . Lacunar infarction ([place]) 09/07/2014  . RAD (reactive airway disease) 09/07/2014  . Cancer of cheek ([place]) 07/04/2007  . Adult hypothyroidism 01/13/2007  . Narrowing of intervertebral disc space 12/30/2006  . Depression, neurotic 12/30/2006  . Acid reflux 12/30/2006  . Fam hx-ischem heart disease 12/30/2006  . History of tobacco use 12/30/2006  . Hypercholesterolemia without hypertriglyceridemia 12/30/2006    Past Surgical History  Procedure  Laterality Date  . Eye surgery      cataract extraction  . Eye surgery  2007    LASIK  . Abdominal hysterectomy  1988    heavy, irregular menses; ovaries intact  . Breast surgery  1970    augmentation  . Tonsillectomy and adenoidectomy  1950    Her family history includes CAD in her brother and mother; Cancer in her brother; Heart Problems in her brother; Heart attack in her father; Hyperthyroidism in her sister; Stroke in her mother.    Previous Medications   ASPIRIN 81 MG TABLET    Take  81 mg by mouth daily.    CHOLECALCIFEROL (VITAMIN D) 400 UNITS TABS TABLET    Take 400 Units by mouth daily.    CYANOCOBALAMIN 100 MCG TABLET    Take 100 mcg by mouth daily.    ESOMEPRAZOLE MAGNESIUM (NEXIUM 24HR PO)    Take 1 tablet by mouth daily.    FLUOXETINE (PROZAC) 20 MG TABLET    Take 3 tablets (60 mg total) by mouth daily.   LEVOTHYROXINE (SYNTHROID) 50 MCG TABLET    Take 1 tablet (50 mcg total) by mouth daily before breakfast.   LORATADINE (CLARITIN) 10 MG TABLET    Take 1 tablet (10 mg total) by mouth daily. Reported on 05/17/2015   MONTELUKAST (SINGULAIR) 10 MG TABLET    Take 1 tablet (10 mg total) by mouth at bedtime. Reported on 05/17/2015   SIMVASTATIN (ZOCOR) 40 MG TABLET    TAKE ONE TABLET BY MOUTH ONCE DAILY    Patient Care Team: Margarita Rana, MD as PCP - General (Family Medicine)     Objective:   Vitals: BP 110/70 mmHg  Pulse 66  Temp(Src) 98.2 F (36.8 C) (Oral)  Resp 16  Ht 5\' 1"  (1.549 m)  Wt 112 lb (50.803 kg)  BMI 21.17 kg/m2  SpO2 96%  Physical Exam  Constitutional: She is oriented to person, place, and time. She appears well-developed and well-nourished.  HENT:  Head: Normocephalic and atraumatic.  Right Ear: Tympanic membrane, external ear and ear canal normal.  Left Ear: Tympanic membrane, external ear and ear canal normal.  Nose: Mucosal edema and rhinorrhea present.  Mouth/Throat: Uvula is midline, oropharynx is clear and moist and mucous membranes are normal.  Eyes: Conjunctivae, EOM and lids are normal. Pupils are equal, round, and reactive to light.  Neck: Trachea normal and normal range of motion. Neck supple. Carotid bruit is not present. No thyroid mass and no thyromegaly present.  Cardiovascular: Normal rate, regular rhythm and normal heart sounds.   Pulmonary/Chest: Effort normal and breath sounds normal.  Bilateral silicone implants  Abdominal: Soft. Normal appearance and bowel sounds are normal. There is no hepatosplenomegaly. There is no  tenderness.  Genitourinary: No breast swelling, tenderness or discharge.  Musculoskeletal: Normal range of motion.  Lymphadenopathy:    She has no cervical adenopathy.    She has no axillary adenopathy.  Neurological: She is alert and oriented to person, place, and time. She has normal strength. No cranial nerve deficit.  Skin: Skin is warm, dry and intact.  Psychiatric: She has a normal mood and affect. Her speech is normal and behavior is normal. Judgment and thought content normal. Cognition and memory are normal.    Activities of Daily Living In your present state of health, do you have any difficulty performing the following activities: 07/22/2015 11/06/2014  Hearing? Y N  Vision? N N  Difficulty concentrating or making decisions? N N  Walking or climbing stairs? N N  Dressing or bathing? N N  Doing errands, shopping? N N    Fall Risk Assessment Fall Risk  07/22/2015 11/06/2014 11/06/2014  Falls in the past year? Yes No No  Number falls in past yr: 1 - -  Injury with Fall? No - -     Depression Screen PHQ 2/9 Scores 07/22/2015 12/10/2014 11/06/2014  PHQ - 2 Score 1 2 0  PHQ- 9 Score - 7 -    Cognitive Testing - 6-CIT  Correct? Score   What year is it? yes 0 0 or 4  What month is it? yes 0 0 or 3  Memorize:    [name],  42,  [address],  [place],      What time is it? (within 1 hour) yes 0 0 or 3  Count backwards from 20 yes 0 0, 2, or 4  Name the months of the year yes 0 0, 2, or 4  Repeat name & address above yes 0 0, 2, 4, 6, 8, or 10       TOTAL SCORE  0/28   Interpretation:  Normal  Normal (0-7) Abnormal (8-28)       Assessment & Plan:     Annual Wellness Visit  Reviewed patient's Family Medical History Reviewed and updated list of patient's medical providers Assessment of cognitive impairment was done Assessed patient's functional ability Established a written schedule for health screening [place] Completed and  Reviewed  Exercise Activities and Dietary recommendations Goals    None      Immunization History  Administered Date(s) Administered  . Influenza, High Dose Seasonal PF 02/13/2015  . Pneumococcal Conjugate-13 02/13/2015  . Tdap 10/16/2010      1. Blood glucose elevated Will check labs.  - Hemoglobin A1c  2. Medicare annual wellness visit, subsequent Eat healthy and try to increase activity. Call if stress worsens after death of her husband.  Also restart Claritin for her allergies.   - EKG 12-Lead  3. Hypothyroidism, unspecified hypothyroidism type Check labs.  - TSH  4. Right upper quadrant pain Will check labs and ultrasound.   Has distant history of gallbladder issues and is concerned that it is the same.   - Amylase - Lipase - CBC with Differential/Platelet - Comprehensive metabolic panel - US Abdomen Limited RUQ; Future  5. Hypercholesterolemia without hypertriglyceridemia Will check labs.   - Comprehensive metabolic panel - Lipid Panel With LDL/HDL Ratio   Patient was seen and examined by Jerrell Belfast, MD, and note scribed by Lynford Humphrey, [place].   I have reviewed the document for accuracy and completeness and I agree with above. Jerrell Belfast, MD   Margarita Rana, MD    ------------------------------------------------------------------------------------------------------------

## 2015-07-23 ENCOUNTER — Telehealth: Payer: Self-pay

## 2015-07-23 DIAGNOSIS — E039 Hypothyroidism, unspecified: Secondary | ICD-10-CM

## 2015-07-23 LAB — CBC WITH DIFFERENTIAL/PLATELET
BASOS ABS: 0 10*3/uL (ref 0.0–0.2)
BASOS: 1 %
EOS (ABSOLUTE): 0.1 10*3/uL (ref 0.0–0.4)
Eos: 3 %
Hematocrit: 38.5 % (ref 34.0–46.6)
Hemoglobin: 12.7 g/dL (ref 11.1–15.9)
IMMATURE GRANS (ABS): 0 10*3/uL (ref 0.0–0.1)
Immature Granulocytes: 0 %
LYMPHS ABS: 1.3 10*3/uL (ref 0.7–3.1)
LYMPHS: 34 %
MCH: 31.4 pg (ref 26.6–33.0)
MCHC: 33 g/dL (ref 31.5–35.7)
MCV: 95 fL (ref 79–97)
MONOS ABS: 0.4 10*3/uL (ref 0.1–0.9)
Monocytes: 10 %
NEUTROS ABS: 2 10*3/uL (ref 1.4–7.0)
Neutrophils: 52 %
PLATELETS: 289 10*3/uL (ref 150–379)
RBC: 4.05 x10E6/uL (ref 3.77–5.28)
RDW: 13.5 % (ref 12.3–15.4)
WBC: 3.9 10*3/uL (ref 3.4–10.8)

## 2015-07-23 LAB — LIPID PANEL WITH LDL/HDL RATIO
Cholesterol, Total: 177 mg/dL (ref 100–199)
HDL: 73 mg/dL (ref >39)
LDL CALC: 87 mg/dL (ref 0–99)
LDL/HDL RATIO: 1.2 ratio (ref 0.0–3.2)
Triglycerides: 83 mg/dL (ref 0–149)
VLDL Cholesterol Cal: 17 mg/dL (ref 5–40)

## 2015-07-23 LAB — COMPREHENSIVE METABOLIC PANEL
A/G RATIO: 2 (ref 1.2–2.2)
ALK PHOS: 42 IU/L (ref 39–117)
ALT: 10 IU/L (ref 0–32)
AST: 16 IU/L (ref 0–40)
Albumin: 4.3 g/dL (ref 3.6–4.8)
BILIRUBIN TOTAL: 0.2 mg/dL (ref 0.0–1.2)
BUN / CREAT RATIO: 24 (ref 11–26)
BUN: 19 mg/dL (ref 8–27)
CHLORIDE: 97 mmol/L (ref 96–106)
CO2: 24 mmol/L (ref 18–29)
Calcium: 9.1 mg/dL (ref 8.7–10.3)
Creatinine, Ser: 0.78 mg/dL (ref 0.57–1.00)
GFR calc non Af Amer: 78 mL/min/{1.73_m2} (ref >59)
GFR, EST AFRICAN AMERICAN: 90 mL/min/{1.73_m2} (ref >59)
GLUCOSE: 92 mg/dL (ref 65–99)
Globulin, Total: 2.2 g/dL (ref 1.5–4.5)
POTASSIUM: 5 mmol/L (ref 3.5–5.2)
Sodium: 140 mmol/L (ref 134–144)
TOTAL PROTEIN: 6.5 g/dL (ref 6.0–8.5)

## 2015-07-23 LAB — LIPASE: LIPASE: 39 U/L (ref 0–59)

## 2015-07-23 LAB — HEMOGLOBIN A1C
Est. average glucose Bld gHb Est-mCnc: 128 mg/dL
HEMOGLOBIN A1C: 6.1 % — AB (ref 4.8–5.6)

## 2015-07-23 LAB — AMYLASE: AMYLASE: 36 U/L (ref 31–124)

## 2015-07-23 LAB — TSH: TSH: 15.3 u[IU]/mL — ABNORMAL HIGH (ref 0.450–4.500)

## 2015-07-23 MED ORDER — LEVOTHYROXINE SODIUM 75 MCG PO TABS: 75.0000 ug | ORAL_TABLET | Freq: Every day | ORAL | Status: DC

## 2015-07-23 NOTE — Telephone Encounter (Signed)
-----   Message from Margarita Rana, MD sent at 07/23/2015  7:55 AM EDT ----- Labs normal except thyroid is too low.  Please see if patient has been taking her levothyroxine regularly. If she has, recommend increase to 75 mcg and recheck in 6 weeks. Thanks.

## 2015-07-23 NOTE — Telephone Encounter (Signed)
Pt has [place] abd scheduled on 07/26/2015. Pt states she can not have this performed on the 17th, and is requesting for this to be done on the 20th, if possible. Thanks. Renaldo Fiddler, CMA

## 2015-07-23 NOTE — Telephone Encounter (Signed)
Yes pt reports she has been taking med regularly. Will send in new rx. Renaldo Fiddler, CMA

## 2015-07-26 ENCOUNTER — Ambulatory Visit: Payer: Medicare HMO

## 2015-07-29 ENCOUNTER — Ambulatory Visit
Admission: RE | Admit: 2015-07-29 | Discharge: 2015-07-29 | Disposition: A | Discharge status: Home / Self Care | Payer: Medicare HMO | Source: Ambulatory Visit | Attending: Family Medicine | Admitting: Family Medicine

## 2015-07-29 DIAGNOSIS — N281 Cyst of kidney, acquired: Secondary | ICD-10-CM | POA: Diagnosis not present

## 2015-07-29 DIAGNOSIS — R1011 Right upper quadrant pain: Secondary | ICD-10-CM | POA: Diagnosis not present

## 2015-07-29 NOTE — Telephone Encounter (Signed)
LMTCB

## 2015-07-30 NOTE — Telephone Encounter (Signed)
Pt states that she had ultrasound done 07/29/15

## 2015-10-02 DIAGNOSIS — Z9842 Cataract extraction status, left eye: Secondary | ICD-10-CM | POA: Diagnosis not present

## 2015-10-02 DIAGNOSIS — H1045 Other chronic allergic conjunctivitis: Secondary | ICD-10-CM | POA: Diagnosis not present

## 2015-10-02 DIAGNOSIS — H35372 Puckering of macula, left eye: Secondary | ICD-10-CM | POA: Diagnosis not present

## 2015-10-02 DIAGNOSIS — H04123 Dry eye syndrome of bilateral lacrimal glands: Secondary | ICD-10-CM | POA: Diagnosis not present

## 2015-10-02 DIAGNOSIS — Z9841 Cataract extraction status, right eye: Secondary | ICD-10-CM | POA: Diagnosis not present

## 2016-01-30 DIAGNOSIS — R69 Illness, unspecified: Secondary | ICD-10-CM | POA: Diagnosis not present

## 2016-02-12 ENCOUNTER — Other Ambulatory Visit: Payer: Self-pay | Admitting: Family Medicine

## 2016-02-12 NOTE — Telephone Encounter (Signed)
LOV 03/13/217. Renaldo Fiddler, CMA

## 2016-02-14 DIAGNOSIS — H11442 Conjunctival cysts, left eye: Secondary | ICD-10-CM | POA: Diagnosis not present

## 2016-03-06 ENCOUNTER — Other Ambulatory Visit: Payer: Self-pay | Admitting: Family Medicine

## 2016-03-06 DIAGNOSIS — E039 Hypothyroidism, unspecified: Secondary | ICD-10-CM

## 2016-03-06 DIAGNOSIS — E7800 Pure hypercholesterolemia, unspecified: Secondary | ICD-10-CM

## 2016-06-10 ENCOUNTER — Encounter: Payer: Self-pay | Admitting: Physician Assistant

## 2016-06-10 ENCOUNTER — Telehealth: Payer: Self-pay | Admitting: Physician Assistant

## 2016-06-10 ENCOUNTER — Ambulatory Visit (INDEPENDENT_AMBULATORY_CARE_PROVIDER_SITE_OTHER): Payer: Medicare HMO | Admitting: Physician Assistant

## 2016-06-10 VITALS — BP 124/76 | HR 84 | Temp 98.4°F | Resp 16 | Wt 109.0 lb

## 2016-06-10 DIAGNOSIS — R062 Wheezing: Secondary | ICD-10-CM

## 2016-06-10 DIAGNOSIS — R05 Cough: Secondary | ICD-10-CM

## 2016-06-10 DIAGNOSIS — J0110 Acute frontal sinusitis, unspecified: Secondary | ICD-10-CM | POA: Diagnosis not present

## 2016-06-10 DIAGNOSIS — R059 Cough, unspecified: Secondary | ICD-10-CM

## 2016-06-10 MED ORDER — DOXYCYCLINE HYCLATE 100 MG PO TABS: 100.0000 mg | ORAL_TABLET | Freq: Two times a day (BID) | ORAL | 0 refills | Status: DC

## 2016-06-10 MED ORDER — BENZONATATE 100 MG PO CAPS: 100.0000 mg | ORAL_CAPSULE | Freq: Three times a day (TID) | ORAL | 0 refills | Status: AC | PRN

## 2016-06-10 MED ORDER — PREDNISONE 20 MG PO TABS: 20.0000 mg | ORAL_TABLET | Freq: Every day | ORAL | 0 refills | Status: DC

## 2016-06-10 NOTE — Telephone Encounter (Signed)
Patient advised.

## 2016-06-10 NOTE — Progress Notes (Signed)
Hailey Cain  Chief Complaint  Patient presents with  . URI    Started six days ago.  . Sinusitis    Subjective:    Patient ID: Cleatrice Burke, female    DOB: 12-18-1945, 71 y.o.   MRN: [id number]  Upper Respiratory Infection: Hailey Cain is a 71 y.o. female with a past medical history significant for 20 pack year smoking history complaining of symptoms of a URI, possible sinusitis. Had cold a 1.5 weeks ago after taking care of her sick grand daughter. Now, symptoms include congestion, cough and sore throat. Onset of symptoms was 6 days ago, gradually worsening since that time. She also c/o bilateral ear pressure/pain, congestion, cough described as productive, lightheadedness, nasal congestion and post nasal drip for the past 6 days .  She is drinking plenty of fluids. Evaluation to date: none. Treatment to date: decongestants. The treatment has provided minimal relief.   Review of Systems  Constitutional: Positive for fatigue. Negative for activity change, appetite change, chills, diaphoresis, fever and unexpected weight change.  HENT: Positive for congestion, nosebleeds, postnasal drip, rhinorrhea, sinus pain, sinus pressure, sneezing, sore throat and voice change. Negative for ear discharge, ear pain (Ears "Feel stopped up"), mouth sores and trouble swallowing.   Eyes: Negative.   Respiratory: Positive for cough, chest tightness, shortness of breath and wheezing. Negative for apnea, choking and stridor.   Gastrointestinal: Positive for vomiting (When she coughs a lot). Negative for abdominal distention, abdominal pain, anal bleeding, blood in stool, constipation, diarrhea, nausea and rectal pain.  Neurological: Positive for light-headedness and headaches. Negative for dizziness.       Objective:   BP 124/76 (BP Location: Left Arm, Patient Position: Sitting, Cuff Size: Normal)   Pulse 84   Temp 98.4 F (36.9 C) (Oral)   Resp 16   Wt 109  lb (49.4 kg)   SpO2 92%   BMI 20.60 kg/m   Patient Active Problem List   Diagnosis Date Noted  . Bronchitis 05/17/2015  . Anxiety 12/10/2014  . Osteopenia 11/06/2014  . Allergic rhinitis 09/07/2014  . Blood glucose elevated 09/07/2014  . Lacunar infarction ([place]) 09/07/2014  . RAD (reactive airway disease) 09/07/2014  . Cancer of cheek ([place]) 07/04/2007  . Adult hypothyroidism 01/13/2007  . Narrowing of intervertebral disc space 12/30/2006  . Depression, neurotic 12/30/2006  . Acid reflux 12/30/2006  . Fam hx-ischem heart disease 12/30/2006  . History of tobacco use 12/30/2006  . Hypercholesterolemia without hypertriglyceridemia 12/30/2006    Outpatient Encounter Prescriptions as of 06/10/2016  Medication Sig Note  . aspirin 81 MG tablet Take 81 mg by mouth daily.  09/07/2014: Received from: [facility]  . cholecalciferol (VITAMIN D) 400 UNITS TABS tablet Take 400 Units by mouth daily.  09/07/2014: Received from: [facility]  . cyanocobalamin 100 MCG tablet Take 100 mcg by mouth daily.  09/07/2014: Received from: [facility]  . Esomeprazole Magnesium (NEXIUM 24HR PO) Take 1 tablet by mouth daily.  11/06/2014: Received from: External Pharmacy  . FLUoxetine (PROZAC) 20 MG capsule TAKE THREE CAPSULES BY MOUTH ONCE DAILY   . levothyroxine (SYNTHROID, LEVOTHROID) 75 MCG tablet TAKE ONE TABLET BY MOUTH ONCE DAILY   . loratadine (CLARITIN) 10 MG tablet Take 1 tablet (10 mg total) by mouth daily. Reported on 05/17/2015   . montelukast (SINGULAIR) 10 MG tablet Take 1 tablet (10 mg total) by mouth at bedtime. Reported on 05/17/2015   . simvastatin (ZOCOR) 40 MG  tablet TAKE ONE TABLET BY MOUTH ONCE DAILY   . doxycycline (VIBRA-TABS) 100 MG tablet Take 1 tablet (100 mg total) by mouth 2 (two) times daily.   . predniSONE (DELTASONE) 20 MG tablet Take 1 tablet (20 mg total) by mouth daily with breakfast.    No facility-administered encounter  medications on file as of 06/10/2016.     No Known Allergies     Physical Exam  Constitutional: She is oriented to person, place, and time. She appears well-developed and well-nourished. No distress.  HENT:  Right Ear: External ear normal.  Left Ear: External ear normal.  Nose: Right sinus exhibits maxillary sinus tenderness and frontal sinus tenderness. Left sinus exhibits maxillary sinus tenderness and frontal sinus tenderness.  Mouth/Throat: Oropharynx is clear and moist. No oropharyngeal exudate, posterior oropharyngeal edema or posterior oropharyngeal erythema.  Tms opaque bilaterally   Eyes: Conjunctivae are normal. Right eye exhibits no discharge. Left eye exhibits no discharge.  Neck: Neck supple.  Cardiovascular: Normal rate and regular rhythm.   Pulmonary/Chest: Effort normal. No respiratory distress. She has wheezes. She has no rales. She exhibits no tenderness.  Wheezes in bilateral lung fields. Ronchi in bilateral lung fields that improve with coughing. No SOB, speaking in full sentences.  Lymphadenopathy:    She has no cervical adenopathy.  Neurological: She is alert and oriented to person, place, and time.  Skin: Skin is warm and dry. She is not diaphoretic.  Psychiatric: She has a normal mood and affect. Her behavior is normal.       Assessment & Plan:   1. Acute non-recurrent frontal sinusitis  Patient breathing well in clinic today. Afebrile, nontoxic looking. Do think patient has some degree of chronic obstructive lung disease with history of smoking, will treat as below and also give steroids. Patient to call back or come back in if worsening.  - doxycycline (VIBRA-TABS) 100 MG tablet; Take 1 tablet (100 mg total) by mouth 2 (two) times daily.  Dispense: 20 tablet; Refill: 0  2. Wheezing  See above.  - predniSONE (DELTASONE) 20 MG tablet; Take 1 tablet (20 mg total) by mouth daily with breakfast.  Dispense: 5 tablet; Refill: 0   Recommend rest, fluids,  frequent hand washing.   Return if symptoms worsen or fail to improve.   Patient Instructions  Sinusitis, Adult Sinusitis is soreness and inflammation of your sinuses. Sinuses are hollow spaces in the bones around your face. They are located:  Around your eyes.  In the middle of your forehead.  Behind your nose.  In your cheekbones. Your sinuses and nasal passages are lined with a stringy fluid (mucus). Mucus normally drains out of your sinuses. When your nasal tissues get inflamed or swollen, the mucus can get trapped or blocked so air cannot flow through your sinuses. This lets bacteria, viruses, and funguses grow, and that leads to infection. Follow these instructions at home: Medicines  Take, use, or apply over-the-counter and prescription medicines only as told by your doctor. These may include nasal sprays.  If you were prescribed an antibiotic medicine, take it as told by your doctor. Do not stop taking the antibiotic even if you start to feel better. Hydrate and Humidify  Drink enough water to keep your pee (urine) clear or pale yellow.  Use a cool mist humidifier to keep the humidity level in your home above 50%.  Breathe in steam for 10-15 minutes, 3-4 times a day or as told by your doctor. You can do this  in the bathroom while a hot shower is running.  Try not to spend time in cool or dry air. Rest  Rest as much as possible.  Sleep with your head raised (elevated).  Make sure to get enough sleep each night. General instructions  Put a warm, moist washcloth on your face 3-4 times a day or as told by your doctor. This will help with discomfort.  Wash your hands often with soap and water. If there is no soap and water, use hand sanitizer.  Do not smoke. Avoid being around people who are smoking (secondhand smoke).  Keep all follow-up visits as told by your doctor. This is important. Contact a doctor if:  You have a fever.  Your symptoms get worse.  Your  symptoms do not get better within 10 days. Get help right away if:  You have a very bad headache.  You cannot stop throwing up (vomiting).  You have pain or swelling around your face or eyes.  You have trouble seeing.  You feel confused.  Your neck is stiff.  You have trouble breathing. This information is not intended to replace advice given to you by your health care provider. Make sure you discuss any questions you have with your health care provider. Document Released: 10/14/2007 Document Revised: 12/22/2015 Document Reviewed: 02/20/2015 Elsevier Interactive Patient Education  2017 [employer].     The entirety of the information documented in the History of Present Illness, Review of Systems and Physical Exam were personally obtained by me. Portions of this information were initially documented by Ashley Royalty, CMA and reviewed by me for thoroughness and accuracy.

## 2016-06-10 NOTE — Telephone Encounter (Signed)
Pt states she was seen today and is requesting a Rx to help with her cough.  [facility]  CB#[phone]/MW

## 2016-06-10 NOTE — Patient Instructions (Signed)

## 2016-06-10 NOTE — Telephone Encounter (Signed)
Sent in Tessalon perles

## 2016-08-11 ENCOUNTER — Ambulatory Visit: Payer: Medicare HMO

## 2016-08-11 ENCOUNTER — Ambulatory Visit (INDEPENDENT_AMBULATORY_CARE_PROVIDER_SITE_OTHER): Payer: Medicare HMO | Admitting: Physician Assistant

## 2016-08-11 VITALS — BP 138/72 | HR 64 | Temp 98.2°F | Ht 61.0 in | Wt 109.4 lb

## 2016-08-11 VITALS — BP 138/72 | HR 64 | Temp 98.2°F | Ht 61.0 in | Wt 109.0 lb

## 2016-08-11 DIAGNOSIS — I639 Cerebral infarction, unspecified: Secondary | ICD-10-CM | POA: Diagnosis not present

## 2016-08-11 DIAGNOSIS — Z0000 Encounter for general adult medical examination without abnormal findings: Secondary | ICD-10-CM

## 2016-08-11 DIAGNOSIS — E7800 Pure hypercholesterolemia, unspecified: Secondary | ICD-10-CM | POA: Diagnosis not present

## 2016-08-11 DIAGNOSIS — R69 Illness, unspecified: Secondary | ICD-10-CM | POA: Diagnosis not present

## 2016-08-11 DIAGNOSIS — R7303 Prediabetes: Secondary | ICD-10-CM | POA: Diagnosis not present

## 2016-08-11 DIAGNOSIS — E039 Hypothyroidism, unspecified: Secondary | ICD-10-CM

## 2016-08-11 DIAGNOSIS — C760 Malignant neoplasm of head, face and neck: Secondary | ICD-10-CM | POA: Diagnosis not present

## 2016-08-11 DIAGNOSIS — M8580 Other specified disorders of bone density and structure, unspecified site: Secondary | ICD-10-CM

## 2016-08-11 DIAGNOSIS — I6381 Other cerebral infarction due to occlusion or stenosis of small artery: Secondary | ICD-10-CM

## 2016-08-11 DIAGNOSIS — Z1159 Encounter for screening for other viral diseases: Secondary | ICD-10-CM | POA: Diagnosis not present

## 2016-08-11 DIAGNOSIS — F419 Anxiety disorder, unspecified: Secondary | ICD-10-CM

## 2016-08-11 HISTORY — DX: Prediabetes: R73.03

## 2016-08-11 NOTE — Patient Instructions (Signed)
Hailey Cain , Thank you for taking time to come for your Medicare Wellness Visit. I appreciate your ongoing commitment to your health goals. Please review the following plan we discussed and let me know if I can assist you in the future.   Screening recommendations/referrals: Colonoscopy: last done 05/27/12 Mammogram: declined Bone Density: last done 02/15/12 Recommended yearly ophthalmology/optometry visit for glaucoma screening and checkup Recommended yearly dental visit for hygiene and checkup  Vaccinations: Influenza vaccine: done 02/2016 Pneumococcal vaccine: completed series Tdap vaccine: done 10/16/10 Shingles vaccine: done    Advanced directives: Packet given today to be completed, requested copy once finished.  Conditions/risks identified: fall risk prevention  Next appointment: None   Preventive Care 65 Years and Older, Female Preventive care refers to lifestyle choices and visits with your health care provider that can promote health and wellness. What does preventive care include?  A yearly physical exam. This is also called an annual well check.  Dental exams once or twice a year.  Routine eye exams. Ask your health care provider how often you should have your eyes checked.  Personal lifestyle choices, including:  Daily care of your teeth and gums.  Regular physical activity.  Eating a healthy diet.  Avoiding tobacco and drug use.  Limiting alcohol use.  Practicing safe sex.  Taking low-dose aspirin every day.  Taking vitamin and mineral supplements as recommended by your health care provider. What happens during an annual well check? The services and screenings done by your health care provider during your annual well check will depend on your age, overall health, lifestyle risk factors, and family history of disease. Counseling  Your health care provider may ask you questions about your:  Alcohol use.  Tobacco use.  Drug use.  Emotional  well-being.  Home and relationship well-being.  Sexual activity.  Eating habits.  History of falls.  Memory and ability to understand (cognition).  Work and work Statistician.  Reproductive health. Screening  You may have the following tests or measurements:  Height, weight, and BMI.  Blood pressure.  Lipid and cholesterol levels. These may be checked every 5 years, or more frequently if you are over 104 years old.  Skin check.  Lung cancer screening. You may have this screening every year starting at age 81 if you have a 30-pack-year history of smoking and currently smoke or have quit within the past 15 years.  Fecal occult blood test (FOBT) of the stool. You may have this test every year starting at age 59.  Flexible sigmoidoscopy or colonoscopy. You may have a sigmoidoscopy every 5 years or a colonoscopy every 10 years starting at age 51.  Hepatitis C blood test.  Hepatitis B blood test.  Sexually transmitted disease (STD) testing.  Diabetes screening. This is done by checking your blood sugar (glucose) after you have not eaten for a while (fasting). You may have this done every 1-3 years.  Bone density scan. This is done to screen for osteoporosis. You may have this done starting at age 59.  Mammogram. This may be done every 1-2 years. Talk to your health care provider about how often you should have regular mammograms. Talk with your health care provider about your test results, treatment options, and if necessary, the need for more tests. Vaccines  Your health care provider may recommend certain vaccines, such as:  Influenza vaccine. This is recommended every year.  Tetanus, diphtheria, and acellular pertussis (Tdap, Td) vaccine. You may need a Td booster every 10  years.  Zoster vaccine. You may need this after age 27.  Pneumococcal 13-valent conjugate (PCV13) vaccine. One dose is recommended after age 36.  Pneumococcal polysaccharide (PPSV23) vaccine. One  dose is recommended after age 71. Talk to your health care provider about which screenings and vaccines you need and how often you need them. This information is not intended to replace advice given to you by your health care provider. Make sure you discuss any questions you have with your health care provider. Document Released: 05/24/2015 Document Revised: 01/15/2016 Document Reviewed: 02/26/2015 Elsevier Interactive Patient Education  2017 [place] Prevention in the Home Falls can cause injuries. They can happen to people of all ages. There are many things you can do to make your home safe and to help prevent falls. What can I do on the outside of my home?  Regularly fix the edges of walkways and driveways and fix any cracks.  Remove anything that might make you trip as you walk through a door, such as a raised step or threshold.  Trim any bushes or trees on the path to your home.  Use bright outdoor lighting.  Clear any walking paths of anything that might make someone trip, such as rocks or tools.  Regularly check to see if handrails are loose or broken. Make sure that both sides of any steps have handrails.  Any raised decks and porches should have guardrails on the edges.  Have any leaves, snow, or ice cleared regularly.  Use sand or salt on walking paths during winter.  Clean up any spills in your garage right away. This includes oil or grease spills. What can I do in the bathroom?  Use night lights.  Install grab bars by the toilet and in the tub and shower. Do not use towel bars as grab bars.  Use non-skid mats or decals in the tub or shower.  If you need to sit down in the shower, use a plastic, non-slip stool.  Keep the floor dry. Clean up any water that spills on the floor as soon as it happens.  Remove soap buildup in the tub or shower regularly.  Attach bath mats securely with double-sided non-slip rug tape.  Do not have throw rugs and other  things on the floor that can make you trip. What can I do in the bedroom?  Use night lights.  Make sure that you have a light by your bed that is easy to reach.  Do not use any sheets or blankets that are too big for your bed. They should not hang down onto the floor.  Have a firm chair that has side arms. You can use this for support while you get dressed.  Do not have throw rugs and other things on the floor that can make you trip. What can I do in the kitchen?  Clean up any spills right away.  Avoid walking on wet floors.  Keep items that you use a lot in easy-to-reach places.  If you need to reach something above you, use a strong step stool that has a grab bar.  Keep electrical cords out of the way.  Do not use floor polish or wax that makes floors slippery. If you must use wax, use non-skid floor wax.  Do not have throw rugs and other things on the floor that can make you trip. What can I do with my stairs?  Do not leave any items on the stairs.  Make sure that  there are handrails on both sides of the stairs and use them. Fix handrails that are broken or loose. Make sure that handrails are as long as the stairways.  Check any carpeting to make sure that it is firmly attached to the stairs. Fix any carpet that is loose or worn.  Avoid having throw rugs at the top or bottom of the stairs. If you do have throw rugs, attach them to the floor with carpet tape.  Make sure that you have a light switch at the top of the stairs and the bottom of the stairs. If you do not have them, ask someone to add them for you. What else can I do to help prevent falls?  Wear shoes that:  Do not have high heels.  Have rubber bottoms.  Are comfortable and fit you well.  Are closed at the toe. Do not wear sandals.  If you use a stepladder:  Make sure that it is fully opened. Do not climb a closed stepladder.  Make sure that both sides of the stepladder are locked into place.  Ask  someone to hold it for you, if possible.  Clearly mark and make sure that you can see:  Any grab bars or handrails.  First and last steps.  Where the edge of each step is.  Use tools that help you move around (mobility aids) if they are needed. These include:  Canes.  Walkers.  Scooters.  Crutches.  Turn on the lights when you go into a dark area. Replace any light bulbs as soon as they burn out.  Set up your furniture so you have a clear path. Avoid moving your furniture around.  If any of your floors are uneven, fix them.  If there are any pets around you, be aware of where they are.  Review your medicines with your doctor. Some medicines can make you feel dizzy. This can increase your chance of falling. Ask your doctor what other things that you can do to help prevent falls. This information is not intended to replace advice given to you by your health care provider. Make sure you discuss any questions you have with your health care provider. Document Released: 02/21/2009 Document Revised: 10/03/2015 Document Reviewed: 06/01/2014 Elsevier Interactive Patient Education  2017 [employer].

## 2016-08-11 NOTE — Patient Instructions (Signed)
Health Maintenance, Female Adopting a healthy lifestyle and getting preventive care can go a long way to promote health and wellness. Talk with your health care provider about what schedule of regular examinations is right for you. This is a good chance for you to check in with your provider about disease prevention and staying healthy. In between checkups, there are plenty of things you can do on your own. Experts have done a lot of research about which lifestyle changes and preventive measures are most likely to keep you healthy. Ask your health care provider for more information. Weight and diet Eat a healthy diet  Be sure to include plenty of vegetables, fruits, low-fat dairy products, and lean protein.  Do not eat a lot of foods high in solid fats, added sugars, or salt.  Get regular exercise. This is one of the most important things you can do for your health.  Most adults should exercise for at least 150 minutes each week. The exercise should increase your heart rate and make you sweat (moderate-intensity exercise).  Most adults should also do strengthening exercises at least twice a week. This is in addition to the moderate-intensity exercise. Maintain a healthy weight  Body mass index (BMI) is a measurement that can be used to identify possible weight problems. It estimates body fat based on height and weight. Your health care provider can help determine your BMI and help you achieve or maintain a healthy weight.  For females 71 years of age and older:  A BMI below 18.5 is considered underweight.  A BMI of 18.5 to 24.9 is normal.  A BMI of 25 to 29.9 is considered overweight.  A BMI of 30 and above is considered obese. Watch levels of cholesterol and blood lipids  You should start having your blood tested for lipids and cholesterol at 71 years of age, then have this test every 5 years.  You may need to have your cholesterol levels checked more often if:  Your lipid or  cholesterol levels are high.  You are older than 71 years of age.  You are at high risk for heart disease. Cancer screening Lung Cancer  Lung cancer screening is recommended for adults 71-42 years old who are at high risk for lung cancer because of a history of smoking.  A yearly low-dose CT scan of the lungs is recommended for people who:  Currently smoke.  Have quit within the past 15 years.  Have at least a 30-pack-year history of smoking. A pack year is smoking an average of one pack of cigarettes a day for 1 year.  Yearly screening should continue until it has been 15 years since you quit.  Yearly screening should stop if you develop a health problem that would prevent you from having lung cancer treatment. Breast Cancer  Practice breast self-awareness. This means understanding how your breasts normally appear and feel.  It also means doing regular breast self-exams. Let your health care provider know about any changes, no matter how small.  If you are in your 20s or 30s, you should have a clinical breast exam (CBE) by a health care provider every 1-3 years as part of a regular health exam.  If you are 34 or older, have a CBE every year. Also consider having a breast X-ray (mammogram) every year.  If you have a family history of breast cancer, talk to your health care provider about genetic screening.  If you are at high risk for breast cancer, talk  to your health care provider about having an MRI and a mammogram every year.  Breast cancer gene (BRCA) assessment is recommended for women who have family members with BRCA-related cancers. BRCA-related cancers include:  Breast.  Ovarian.  Tubal.  Peritoneal cancers.  Results of the assessment will determine the need for genetic counseling and BRCA1 and BRCA2 testing. Cervical Cancer  Your health care provider may recommend that you be screened regularly for cancer of the pelvic organs (ovaries, uterus, and vagina).  This screening involves a pelvic examination, including checking for microscopic changes to the surface of your cervix (Pap test). You may be encouraged to have this screening done every 3 years, beginning at age 24.  For women ages 66-65, health care providers may recommend pelvic exams and Pap testing every 3 years, or they may recommend the Pap and pelvic exam, combined with testing for human papilloma virus (HPV), every 5 years. Some types of HPV increase your risk of cervical cancer. Testing for HPV may also be done on women of any age with unclear Pap test results.  Other health care providers may not recommend any screening for nonpregnant women who are considered low risk for pelvic cancer and who do not have symptoms. Ask your health care provider if a screening pelvic exam is right for you.  If you have had past treatment for cervical cancer or a condition that could lead to cancer, you need Pap tests and screening for cancer for at least 20 years after your treatment. If Pap tests have been discontinued, your risk factors (such as having a new sexual partner) need to be reassessed to determine if screening should resume. Some women have medical problems that increase the chance of getting cervical cancer. In these cases, your health care provider may recommend more frequent screening and Pap tests. Colorectal Cancer  This type of cancer can be detected and often prevented.  Routine colorectal cancer screening usually begins at 71 years of age and continues through 71 years of age.  Your health care provider may recommend screening at an earlier age if you have risk factors for colon cancer.  Your health care provider may also recommend using home test kits to check for hidden blood in the stool.  A small camera at the end of a tube can be used to examine your colon directly (sigmoidoscopy or colonoscopy). This is done to check for the earliest forms of colorectal cancer.  Routine  screening usually begins at age 71.  Direct examination of the colon should be repeated every 5-10 years through 71 years of age. However, you may need to be screened more often if early forms of precancerous polyps or small growths are found. Skin Cancer  Check your skin from head to toe regularly.  Tell your health care provider about any new moles or changes in moles, especially if there is a change in a mole's shape or color.  Also tell your health care provider if you have a mole that is larger than the size of a pencil eraser.  Always use sunscreen. Apply sunscreen liberally and repeatedly throughout the day.  Protect yourself by wearing long sleeves, pants, a wide-brimmed hat, and sunglasses whenever you are outside. Heart disease, diabetes, and high blood pressure  High blood pressure causes heart disease and increases the risk of stroke. High blood pressure is more likely to develop in:  People who have blood pressure in the high end of the normal range (130-139/85-89 mm Hg).  People who are overweight or obese.  People who are African American.  If you are 18-39 years of age, have your blood pressure checked every 3-5 years. If you are 40 years of age or older, have your blood pressure checked every year. You should have your blood pressure measured twice-once when you are at a hospital or clinic, and once when you are not at a hospital or clinic. Record the average of the two measurements. To check your blood pressure when you are not at a hospital or clinic, you can use:  An automated blood pressure machine at a pharmacy.  A home blood pressure monitor.  If you are between 55 years and 79 years old, ask your health care provider if you should take aspirin to prevent strokes.  Have regular diabetes screenings. This involves taking a blood sample to check your fasting blood sugar level.  If you are at a normal weight and have a low risk for diabetes, have this test once  every three years after 71 years of age.  If you are overweight and have a high risk for diabetes, consider being tested at a younger age or more often. Preventing infection Hepatitis B  If you have a higher risk for hepatitis B, you should be screened for this virus. You are considered at high risk for hepatitis B if:  You were born in a country where hepatitis B is common. Ask your health care provider which countries are considered high risk.  Your parents were born in a high-risk country, and you have not been immunized against hepatitis B (hepatitis B vaccine).  You have HIV or AIDS.  You use needles to inject street drugs.  You live with someone who has hepatitis B.  You have had sex with someone who has hepatitis B.  You get hemodialysis treatment.  You take certain medicines for conditions, including cancer, organ transplantation, and autoimmune conditions. Hepatitis C  Blood testing is recommended for:  Everyone born from 1945 through 1965.  Anyone with known risk factors for hepatitis C. Sexually transmitted infections (STIs)  You should be screened for sexually transmitted infections (STIs) including gonorrhea and chlamydia if:  You are sexually active and are younger than 71 years of age.  You are older than 71 years of age and your health care provider tells you that you are at risk for this type of infection.  Your sexual activity has changed since you were last screened and you are at an increased risk for chlamydia or gonorrhea. Ask your health care provider if you are at risk.  If you do not have HIV, but are at risk, it may be recommended that you take a prescription medicine daily to prevent HIV infection. This is called pre-exposure prophylaxis (PrEP). You are considered at risk if:  You are sexually active and do not regularly use condoms or know the HIV status of your partner(s).  You take drugs by injection.  You are sexually active with a partner  who has HIV. Talk with your health care provider about whether you are at high risk of being infected with HIV. If you choose to begin PrEP, you should first be tested for HIV. You should then be tested every 3 months for as long as you are taking PrEP. Pregnancy  If you are premenopausal and you may become pregnant, ask your health care provider about preconception counseling.  If you may become pregnant, take 400 to 800 micrograms (mcg) of folic acid   every day.  If you want to prevent pregnancy, talk to your health care provider about birth control (contraception). Osteoporosis and menopause  Osteoporosis is a disease in which the bones lose minerals and strength with aging. This can result in serious bone fractures. Your risk for osteoporosis can be identified using a bone density scan.  If you are 4 years of age or older, or if you are at risk for osteoporosis and fractures, ask your health care provider if you should be screened.  Ask your health care provider whether you should take a calcium or vitamin D supplement to lower your risk for osteoporosis.  Menopause may have certain physical symptoms and risks.  Hormone replacement therapy may reduce some of these symptoms and risks. Talk to your health care provider about whether hormone replacement therapy is right for you. Follow these instructions at home:  Schedule regular health, dental, and eye exams.  Stay current with your immunizations.  Do not use any tobacco products including cigarettes, chewing tobacco, or electronic cigarettes.  If you are pregnant, do not drink alcohol.  If you are breastfeeding, limit how much and how often you drink alcohol.  Limit alcohol intake to no more than 1 drink per day for nonpregnant women. One drink equals 12 ounces of beer, 5 ounces of wine, or 1 ounces of hard liquor.  Do not use street drugs.  Do not share needles.  Ask your health care provider for help if you need support  or information about quitting drugs.  Tell your health care provider if you often feel depressed.  Tell your health care provider if you have ever been abused or do not feel safe at home. This information is not intended to replace advice given to you by your health care provider. Make sure you discuss any questions you have with your health care provider. Document Released: 11/10/2010 Document Revised: 10/03/2015 Document Reviewed: 01/29/2015 Elsevier Interactive Patient Education  2017 [employer].

## 2016-08-11 NOTE — Progress Notes (Signed)
Patient: Hailey Cain, Female    DOB: [birth year], 71 y.o.   MRN: [id number] Visit Date: 08/11/2016  Today's Provider: Trinna Post, PA-C   Chief Complaint  Patient presents with  . Hypothyroidism  . Hyperlipidemia  . Depression   Subjective:   HPI  Hypothyroid, follow-up:  TSH  Date Value Ref Range Status  07/22/2015 15.300 (H) 0.450 - 4.500 uIU/mL Final  02/11/2015 8.930 (H) 0.450 - 4.500 uIU/mL Final  11/07/2014 0.328 (L) 0.450 - 4.500 uIU/mL Final   Wt Readings from Last 3 Encounters:  08/11/16 109 lb (49.4 kg)  08/11/16 109 lb 6.4 oz (49.6 kg)  06/10/16 109 lb (49.4 kg)    She was last seen for hypothyroid ober 1 years ago.  Management since that visit includes checking labs, and increasing levothyroxine to 75 mcg. Was supposed to get follow up TSH but never did. Will recheck today. She reports excellent compliance with treatment. She is not having side effects.  She is not exercising. Pt states she was exercising regularly until she got sick this past winter with a sinus infection. She is experiencing nervousness She denies change in energy level, diarrhea, heat / cold intolerance, palpitations and weight changes Weight trend: stable  ------------------------------------------------------------------------   Lipid/Cholesterol, Follow-up:   Last seen for this over 1 years ago.  Management changes since that visit include checking labs. . Last Lipid Panel:    Component Value Date/Time   CHOL 177 07/22/2015 1132   TRIG 83 07/22/2015 1132   HDL 73 07/22/2015 1132   CHOLHDL 2.5 11/07/2014 0845   LDLCALC 87 07/22/2015 1132    Risk factors for vascular disease include hypercholesterolemia  She reports excellent compliance with treatment. She is not having side effects.  Current diet: well balanced  Wt Readings from Last 3 Encounters:  08/11/16 109 lb (49.4 kg)  08/11/16 109 lb 6.4 oz (49.6 kg)  06/10/16 109 lb (49.4 kg)    Depression/Anxiety: Pt reports anxiety and mood are stable on prozac. Her husband of 30+ years passed away in 07-01-2015. She is doing Okay with this. Her prozac switched manufacturers one year ago and she reports indigestion with this now that she takes it. Does not drink full cup of water with it, Nexium helps.  Cancer of Cheek: patient has history of basal cell carcinoma on left side of face. She is seen by Dr. Tamera Stands at [facility]. She hasn't been seen for this in about 1-2 years.  Osteopenia: Last DeXA in 2013 revealed osteopenia. Pt takes vitamin D and works out at gym. Hasn't gotten repeat DEXA since then. Did fall once a week ago, but this was just an accident. No injuries.  Lacunar Infarction: Found incidentally on Brain MRI. Patient never had symptoms of weakness, fainting, problems with speech. She takes ASA 81 mg daily.  Prediabetes: Last A1c 1 year ago was 6.1. No symptoms of hypoglycemia. -------------------------------------------------------------------    Review of Systems  Constitutional: Negative for activity change, appetite change, chills, diaphoresis, fatigue, fever and unexpected weight change.  Respiratory: Positive for cough (since having sinusitis in 30-Jun-2022). Negative for shortness of breath.   Cardiovascular: Negative for chest pain, palpitations and leg swelling.  Endocrine: Negative for cold intolerance and heat intolerance.  Psychiatric/Behavioral: Negative for dysphoric mood, sleep disturbance and suicidal ideas. The patient is not nervous/anxious.     Social History   Social History  . Marital status: Widowed    Spouse name: Laban Emperor  .  Number of children: 1  . Years of education: H/S   Occupational History  . Social research officer, government.     Part-Time but sometimes full-time   Social History Main Topics  . Smoking status: Former Smoker    Packs/day: 1.00    Years: 20.00    Quit date: 05/12/2003  . Smokeless tobacco: Never Used   . Alcohol use No  . Drug use: No  . Sexual activity: Not on file   Other Topics Concern  . Not on file   Social History Narrative  . No narrative on file    No past medical history on file.   Patient Active Problem List   Diagnosis Date Noted  . Bronchitis 05/17/2015  . Anxiety 12/10/2014  . Osteopenia 11/06/2014  . Allergic rhinitis 09/07/2014  . Blood glucose elevated 09/07/2014  . Lacunar infarction ([place]) 09/07/2014  . RAD (reactive airway disease) 09/07/2014  . Cancer of cheek ([place]) 07/04/2007  . Adult hypothyroidism 01/13/2007  . Narrowing of intervertebral disc space 12/30/2006  . Depression, neurotic 12/30/2006  . Acid reflux 12/30/2006  . Fam hx-ischem heart disease 12/30/2006  . History of tobacco use 12/30/2006  . Hypercholesterolemia without hypertriglyceridemia 12/30/2006    Past Surgical History:  Procedure Laterality Date  . ABDOMINAL HYSTERECTOMY  1988   heavy, irregular menses; ovaries intact  . BREAST SURGERY  1970   augmentation  . EYE SURGERY     cataract extraction  . EYE SURGERY  2007   LASIK  . TONSILLECTOMY AND ADENOIDECTOMY  1950    Her family history includes CAD in her brother and mother; Cancer in her brother; Heart Problems in her brother; Heart attack in her father; Hyperthyroidism in her sister; Stroke in her mother.      Current Outpatient Prescriptions:  .  aspirin 81 MG tablet, Take 81 mg by mouth daily. , Disp: , Rfl:  .  cholecalciferol (VITAMIN D) 400 UNITS TABS tablet, Take 400 Units by mouth daily. , Disp: , Rfl:  .  cyanocobalamin 100 MCG tablet, Take 100 mcg by mouth daily. , Disp: , Rfl:  .  doxycycline (VIBRA-TABS) 100 MG tablet, Take 1 tablet (100 mg total) by mouth 2 (two) times daily. (Patient not taking: Reported on 08/11/2016), Disp: 20 tablet, Rfl: 0 .  Esomeprazole Magnesium (NEXIUM 24HR PO), Take 1 tablet by mouth daily. , Disp: , Rfl:  .  FLUoxetine (PROZAC) 20 MG capsule, TAKE THREE CAPSULES BY MOUTH ONCE DAILY,  Disp: 270 capsule, Rfl: 1 .  levothyroxine (SYNTHROID, LEVOTHROID) 75 MCG tablet, TAKE ONE TABLET BY MOUTH ONCE DAILY, Disp: 90 tablet, Rfl: 1 .  loratadine (CLARITIN) 10 MG tablet, Take 1 tablet (10 mg total) by mouth daily. Reported on 05/17/2015, Disp: 30 tablet, Rfl: 5 .  montelukast (SINGULAIR) 10 MG tablet, Take 1 tablet (10 mg total) by mouth at bedtime. Reported on 05/17/2015, Disp: 30 tablet, Rfl: 5 .  Multiple Vitamins-Minerals (EMERGEN-C IMMUNE PO), Take by mouth., Disp: , Rfl:  .  simvastatin (ZOCOR) 40 MG tablet, TAKE ONE TABLET BY MOUTH ONCE DAILY, Disp: 90 tablet, Rfl: 1  Patient Care Team: Trinna Post, PA-C as PCP - General (Physician Assistant)     Objective:   Vitals: BP 138/72   Pulse 64   Temp 98.2 F (36.8 C)   Ht 5\' 1"  (1.549 m)   Wt 109 lb (49.4 kg)   BMI 20.60 kg/m   Physical Exam      Assessment & Plan:  1. Adult hypothyroidism  Check as below. If levels as elevated as last time, will likely have to increase Synthroid. - TSH  2. Lacunar infarction [facility])  Incidental finding on brain MRI. Taking 81 mg ASA daily for secondary stroke prophylaxis.  3. Cancer of cheek ([place])  Basal cell carcinoma removed x 5 yrs ago. Needs to see Dr. Kellie Moor yearly for skin checks. Patient is to schedule this as she is current patient there.   4. Hypercholesterolemia without hypertriglyceridemia  Will check as below, no adverse effects from statin.  - Lipid panel  5. Anxiety  Stable on Prozac, but with some indigestion since manufacturer changed. Sounds like esophageal irritation from pill. Instructed patient to drink pill with full cup of water to see if this helps. If not, please call back.  6. Prediabetes  - Comprehensive metabolic panel - CBC with Differential/Platelet - Hemoglobin A1c  7. Osteopenia, unspecified location  Last t score osteopenic. Patient continues with vitamin D and weight bearing exercises. - DG Bone Density; Future  See [place]  back one year for AWV/follow up.    The entirety of the information documented in the History of Present Illness, Review of Systems and Physical Exam were personally obtained by me. Portions of this information were initially documented by Raquel Sarna D and reviewed by me for thoroughness and accuracy.        Trinna Post, PA-C  [facility]

## 2016-08-11 NOTE — Progress Notes (Signed)
Subjective:   Hailey Cain is a 71 y.o. female who presents for Medicare Annual (Subsequent) preventive examination.  Review of Systems:  N/A Cardiac Risk Factors include: advanced age (>25men, >52 women);dyslipidemia     Objective:     Vitals: BP 138/72 (BP Location: Right Arm)   Pulse 64   Temp 98.2 F (36.8 C) (Oral)   Ht 5\' 1"  (1.549 m)   Wt 109 lb 6.4 oz (49.6 kg)   BMI 20.67 kg/m   Body mass index is 20.67 kg/m.   Tobacco History  Smoking Status  . Former Smoker  . Packs/day: 1.00  . Years: 20.00  . Quit date: 05/12/2003  Smokeless Tobacco  . Never Used     Counseling given: Not Answered   History reviewed. No pertinent past medical history. Past Surgical History:  Procedure Laterality Date  . ABDOMINAL HYSTERECTOMY  1988   heavy, irregular menses; ovaries intact  . BREAST SURGERY  1970   augmentation  . EYE SURGERY     cataract extraction  . EYE SURGERY  2007   LASIK  . TONSILLECTOMY AND ADENOIDECTOMY  1950   Family History  Problem Relation Age of Onset  . Stroke Mother   . CAD Mother   . Heart attack Father   . Hyperthyroidism Sister   . Heart Problems Brother   . Cancer Brother     skin  . CAD Brother     CABG   History  Sexual Activity  . Sexual activity: Not on file    Outpatient Encounter Prescriptions as of 08/11/2016  Medication Sig  . aspirin 81 MG tablet Take 81 mg by mouth daily.   . cholecalciferol (VITAMIN D) 400 UNITS TABS tablet Take 400 Units by mouth daily.   . cyanocobalamin 100 MCG tablet Take 100 mcg by mouth daily.   . Esomeprazole Magnesium (NEXIUM 24HR PO) Take 1 tablet by mouth daily.   Marland Kitchen FLUoxetine (PROZAC) 20 MG capsule TAKE THREE CAPSULES BY MOUTH ONCE DAILY  . levothyroxine (SYNTHROID, LEVOTHROID) 75 MCG tablet TAKE ONE TABLET BY MOUTH ONCE DAILY  . loratadine (CLARITIN) 10 MG tablet Take 1 tablet (10 mg total) by mouth daily. Reported on 05/17/2015  . montelukast (SINGULAIR) 10 MG tablet Take 1 tablet (10  mg total) by mouth at bedtime. Reported on 05/17/2015  . simvastatin (ZOCOR) 40 MG tablet TAKE ONE TABLET BY MOUTH ONCE DAILY  . doxycycline (VIBRA-TABS) 100 MG tablet Take 1 tablet (100 mg total) by mouth 2 (two) times daily. (Patient not taking: Reported on 08/11/2016)   No facility-administered encounter medications on file as of 08/11/2016.     Activities of Daily Living In your present state of health, do you have any difficulty performing the following activities: 08/11/2016  Hearing? Y  Vision? N  Difficulty concentrating or making decisions? N  Walking or climbing stairs? N  Dressing or bathing? N  Doing errands, shopping? N  Preparing Food and eating ? N  Using the Toilet? N  In the past six months, have you accidently leaked urine? N  Do you have problems with loss of bowel control? N  Managing your Medications? N  Managing your Finances? N  Housekeeping or managing your Housekeeping? N  Some recent data might be hidden    Patient Care Team: Trinna Post, PA-C as PCP - General (Physician Assistant)    Assessment:    Exercise Activities and Dietary recommendations Current Exercise Habits: The patient does not participate in regular  exercise at present (taken a break from the gym due to moving), Exercise limited by: None identified  Goals    . Exercise           Recommend starting back exercising at the gym 3 days a week for 1 hour.       Fall Risk Fall Risk  08/11/2016 07/22/2015 11/06/2014 11/06/2014  Falls in the past year? Yes Yes No No  Number falls in past yr: 1 1 - -  Injury with Fall? No No - -  Follow up Falls prevention discussed - - -   Depression Screen PHQ 2/9 Scores 08/11/2016 08/11/2016 07/22/2015 12/10/2014  PHQ - 2 Score 0 0 1 2  PHQ- 9 Score 3 - - 7     Cognitive Function     6CIT Screen 08/11/2016  What Year? 0 points  What month? 0 points  What time? 0 points  Count back from 20 0 points  Months in reverse 0 points  Repeat phrase 4 points    Total Score 4    Immunization History  Administered Date(s) Administered  . Influenza, High Dose Seasonal PF 02/13/2015  . Pneumococcal Conjugate-13 02/13/2015  . Tdap 10/16/2010   Screening Tests Health Maintenance  Topic Date Due  . MAMMOGRAM  07/09/2017 (Originally 10/30/2012)  . INFLUENZA VACCINE  12/09/2016  . TETANUS/TDAP  10/15/2020  . COLONOSCOPY  05/27/2022  . DEXA SCAN  Completed  . Hepatitis C Screening  Completed  . PNA vac Low Risk Adult  Completed      Plan:  I have personally reviewed and addressed the Medicare Annual Wellness questionnaire and have noted the following in the patient's chart:  A. Medical and social history B. Use of alcohol, tobacco or illicit drugs  C. Current medications and supplements D. Functional ability and status E.  Nutritional status F.  Physical activity G. Advance directives H. List of other physicians I.  Hospitalizations, surgeries, and ER visits in previous 12 months J.  [place] such as hearing and vision if needed, cognitive and depression L. Referrals and appointments - none  In addition, I have reviewed and discussed with patient certain preventive protocols, quality metrics, and best practice recommendations. A written personalized care plan for preventive services as well as general preventive health recommendations were provided to patient.  See attached scanned questionnaire for additional information.   Signed,  Fabio Neighbors, LPN Nurse Health Advisor   MD Recommendations: None. Pt declined mammogram order today.

## 2016-08-12 LAB — CBC WITH DIFFERENTIAL/PLATELET
Basophils Absolute: 0 10*3/uL (ref 0.0–0.2)
Basos: 1 %
EOS (ABSOLUTE): 0.1 10*3/uL (ref 0.0–0.4)
Eos: 2 %
Hematocrit: 39.3 % (ref 34.0–46.6)
Hemoglobin: 12.9 g/dL (ref 11.1–15.9)
Immature Grans (Abs): 0 10*3/uL (ref 0.0–0.1)
Immature Granulocytes: 0 %
Lymphocytes Absolute: 1.6 10*3/uL (ref 0.7–3.1)
Lymphs: 39 %
MCH: 30.9 pg (ref 26.6–33.0)
MCHC: 32.8 g/dL (ref 31.5–35.7)
MCV: 94 fL (ref 79–97)
Monocytes Absolute: 0.3 10*3/uL (ref 0.1–0.9)
Monocytes: 8 %
Neutrophils Absolute: 2.1 10*3/uL (ref 1.4–7.0)
Neutrophils: 50 %
Platelets: 234 10*3/uL (ref 150–379)
RBC: 4.18 x10E6/uL (ref 3.77–5.28)
RDW: 14.4 % (ref 12.3–15.4)
WBC: 4.2 10*3/uL (ref 3.4–10.8)

## 2016-08-12 LAB — COMPREHENSIVE METABOLIC PANEL
ALT: 14 IU/L (ref 0–32)
AST: 25 IU/L (ref 0–40)
Albumin/Globulin Ratio: 2 (ref 1.2–2.2)
Albumin: 4.2 g/dL (ref 3.5–4.8)
Alkaline Phosphatase: 43 IU/L (ref 39–117)
BUN/Creatinine Ratio: 20 (ref 12–28)
BUN: 14 mg/dL (ref 8–27)
Bilirubin Total: <0.2 mg/dL (ref 0.0–1.2)
CO2: 24 mmol/L (ref 18–29)
Calcium: 9.1 mg/dL (ref 8.7–10.3)
Chloride: 102 mmol/L (ref 96–106)
Creatinine, Ser: 0.71 mg/dL (ref 0.57–1.00)
GFR calc Af Amer: 100 mL/min/{1.73_m2} (ref >59)
GFR calc non Af Amer: 87 mL/min/{1.73_m2} (ref >59)
Globulin, Total: 2.1 g/dL (ref 1.5–4.5)
Glucose: 82 mg/dL (ref 65–99)
Potassium: 4.3 mmol/L (ref 3.5–5.2)
Sodium: 142 mmol/L (ref 134–144)
Total Protein: 6.3 g/dL (ref 6.0–8.5)

## 2016-08-12 LAB — TSH: TSH: 2.76 u[IU]/mL (ref 0.450–4.500)

## 2016-08-12 LAB — LIPID PANEL
Chol/HDL Ratio: 2.5 ratio (ref 0.0–4.4)
Cholesterol, Total: 202 mg/dL — ABNORMAL HIGH (ref 100–199)
HDL: 82 mg/dL (ref >39)
LDL Calculated: 104 mg/dL — ABNORMAL HIGH (ref 0–99)
Triglycerides: 82 mg/dL (ref 0–149)
VLDL Cholesterol Cal: 16 mg/dL (ref 5–40)

## 2016-08-12 LAB — HEMOGLOBIN A1C
Est. average glucose Bld gHb Est-mCnc: 111 mg/dL
Hgb A1c MFr Bld: 5.5 % (ref 4.8–5.6)

## 2016-08-12 LAB — HEPATITIS C ANTIBODY: Hep C Virus Ab: <0.1 s/co ratio (ref 0.0–0.9)

## 2016-08-18 NOTE — Progress Notes (Signed)
Advised  ED 

## 2016-09-10 ENCOUNTER — Other Ambulatory Visit: Payer: Self-pay | Admitting: Physician Assistant

## 2016-09-10 DIAGNOSIS — F329 Major depressive disorder, single episode, unspecified: Secondary | ICD-10-CM

## 2016-09-10 DIAGNOSIS — F32A Depression, unspecified: Secondary | ICD-10-CM

## 2016-09-10 DIAGNOSIS — E039 Hypothyroidism, unspecified: Secondary | ICD-10-CM

## 2016-09-10 NOTE — Telephone Encounter (Signed)
Pt contacted office for refill request on the following medications:  [facility] [place].  CB#[phone]/MW  levothyroxine (SYNTHROID, LEVOTHROID) 75 MCG tablet  FLUoxetine (PROZAC) 20 MG capsule  Pt states she is in [place] visiting family has has run out of medication.  Pt is requesting 1 weeks worth of medication.

## 2016-09-10 NOTE — Telephone Encounter (Signed)
Pt called to check on rx.  I told her it would probably be later on this afternoon.    Con Memos

## 2016-09-10 NOTE — Telephone Encounter (Signed)
Pt has called back again wanting to know the status.  teri

## 2016-09-10 NOTE — Telephone Encounter (Signed)
Seen by Fabio Bering, never seen by me.

## 2016-09-10 NOTE — Telephone Encounter (Signed)
Please Review.  Thanks,  -Joseline 

## 2016-09-11 MED ORDER — LEVOTHYROXINE SODIUM 75 MCG PO TABS: 75.0000 ug | ORAL_TABLET | Freq: Every day | ORAL | 1 refills | Status: DC

## 2016-09-11 MED ORDER — FLUOXETINE HCL 20 MG PO CAPS: ORAL_CAPSULE | ORAL | 1 refills | Status: DC

## 2016-09-17 ENCOUNTER — Ambulatory Visit: Payer: Medicare HMO

## 2016-09-23 ENCOUNTER — Other Ambulatory Visit: Payer: Self-pay | Admitting: Physician Assistant

## 2016-09-23 DIAGNOSIS — E7800 Pure hypercholesterolemia, unspecified: Secondary | ICD-10-CM

## 2016-09-23 NOTE — Telephone Encounter (Signed)
Last ov  08/11/16 Last filled 03/06/17

## 2017-01-31 ENCOUNTER — Emergency Department: Payer: Medicare HMO

## 2017-01-31 ENCOUNTER — Inpatient Hospital Stay
Admission: EM | Admit: 2017-01-31 | Discharge: 2017-02-04 | DRG: 390 | Disposition: A | Discharge status: Home / Self Care | Payer: Medicare HMO | Attending: Surgery | Admitting: Surgery

## 2017-01-31 ENCOUNTER — Encounter: Payer: Self-pay | Admitting: Emergency Medicine

## 2017-01-31 ENCOUNTER — Inpatient Hospital Stay: Payer: Medicare HMO

## 2017-01-31 DIAGNOSIS — Z90710 Acquired absence of both cervix and uterus: Secondary | ICD-10-CM | POA: Diagnosis not present

## 2017-01-31 DIAGNOSIS — R1084 Generalized abdominal pain: Secondary | ICD-10-CM | POA: Diagnosis not present

## 2017-01-31 DIAGNOSIS — Z87891 Personal history of nicotine dependence: Secondary | ICD-10-CM | POA: Diagnosis not present

## 2017-01-31 DIAGNOSIS — R1110 Vomiting, unspecified: Secondary | ICD-10-CM | POA: Diagnosis not present

## 2017-01-31 DIAGNOSIS — E7800 Pure hypercholesterolemia, unspecified: Secondary | ICD-10-CM | POA: Diagnosis present

## 2017-01-31 DIAGNOSIS — E039 Hypothyroidism, unspecified: Secondary | ICD-10-CM | POA: Diagnosis present

## 2017-01-31 DIAGNOSIS — J45909 Unspecified asthma, uncomplicated: Secondary | ICD-10-CM | POA: Diagnosis present

## 2017-01-31 DIAGNOSIS — Z789 Other specified health status: Secondary | ICD-10-CM

## 2017-01-31 DIAGNOSIS — R140 Abdominal distension (gaseous): Secondary | ICD-10-CM | POA: Diagnosis not present

## 2017-01-31 DIAGNOSIS — K56609 Unspecified intestinal obstruction, unspecified as to partial versus complete obstruction: Secondary | ICD-10-CM

## 2017-01-31 DIAGNOSIS — Z8719 Personal history of other diseases of the digestive system: Secondary | ICD-10-CM | POA: Diagnosis present

## 2017-01-31 DIAGNOSIS — Z4682 Encounter for fitting and adjustment of non-vascular catheter: Secondary | ICD-10-CM | POA: Diagnosis not present

## 2017-01-31 DIAGNOSIS — R112 Nausea with vomiting, unspecified: Secondary | ICD-10-CM

## 2017-01-31 DIAGNOSIS — R109 Unspecified abdominal pain: Secondary | ICD-10-CM | POA: Diagnosis not present

## 2017-01-31 HISTORY — DX: Unspecified intestinal obstruction, unspecified as to partial versus complete obstruction: K56.609

## 2017-01-31 LAB — CBC
HEMATOCRIT: 40.8 % (ref 35.0–47.0)
Hemoglobin: 13.9 g/dL (ref 12.0–16.0)
MCH: 31.6 pg (ref 26.0–34.0)
MCHC: 34 g/dL (ref 32.0–36.0)
MCV: 92.9 fL (ref 80.0–100.0)
Platelets: 239 10*3/uL (ref 150–440)
RBC: 4.39 MIL/uL (ref 3.80–5.20)
RDW: 13.6 % (ref 11.5–14.5)
WBC: 7.4 10*3/uL (ref 3.6–11.0)

## 2017-01-31 LAB — URINALYSIS, COMPLETE (UACMP) WITH MICROSCOPIC
BACTERIA UA: NONE SEEN
BILIRUBIN URINE: NEGATIVE
Glucose, UA: NEGATIVE mg/dL
Ketones, ur: NEGATIVE mg/dL
Leukocytes, UA: NEGATIVE
NITRITE: NEGATIVE
PH: 5 (ref 5.0–8.0)
Protein, ur: NEGATIVE mg/dL
SPECIFIC GRAVITY, URINE: 1.009 (ref 1.005–1.030)
Squamous Epithelial / LPF: NONE SEEN

## 2017-01-31 LAB — COMPREHENSIVE METABOLIC PANEL
ALBUMIN: 3.7 g/dL (ref 3.5–5.0)
ALK PHOS: 39 U/L (ref 38–126)
ALT: 16 U/L (ref 14–54)
AST: 27 U/L (ref 15–41)
Anion gap: 7 (ref 5–15)
BILIRUBIN TOTAL: 0.7 mg/dL (ref 0.3–1.2)
BUN: 15 mg/dL (ref 6–20)
CALCIUM: 9.4 mg/dL (ref 8.9–10.3)
CO2: 28 mmol/L (ref 22–32)
Chloride: 102 mmol/L (ref 101–111)
Creatinine, Ser: 0.6 mg/dL (ref 0.44–1.00)
GFR calc Af Amer: >60 mL/min (ref >60)
GFR calc non Af Amer: >60 mL/min (ref >60)
GLUCOSE: 106 mg/dL — AB (ref 65–99)
Potassium: 3.7 mmol/L (ref 3.5–5.1)
Sodium: 137 mmol/L (ref 135–145)
TOTAL PROTEIN: 6.7 g/dL (ref 6.5–8.1)

## 2017-01-31 LAB — LIPASE, BLOOD: Lipase: 27 U/L (ref 11–51)

## 2017-01-31 MED ORDER — KCL IN DEXTROSE-NACL 20-5-0.45 MEQ/L-%-% IV SOLN
INTRAVENOUS | Status: AC
  Filled 2017-01-31: qty 1000

## 2017-01-31 MED ORDER — ONDANSETRON HCL 4 MG/2ML IJ SOLN
4.0000 mg | Freq: Once | INTRAMUSCULAR | Status: AC
  Administered 2017-01-31: 4 mg via INTRAVENOUS
  Filled 2017-01-31: qty 2

## 2017-01-31 MED ORDER — ONDANSETRON HCL 4 MG/2ML IJ SOLN
INTRAMUSCULAR | Status: AC
  Filled 2017-01-31: qty 2

## 2017-01-31 MED ORDER — ONDANSETRON HCL 4 MG PO TABS: 4.0000 mg | ORAL_TABLET | Freq: Four times a day (QID) | ORAL | Status: DC | PRN

## 2017-01-31 MED ORDER — HEPARIN SODIUM (PORCINE) 5000 UNIT/ML IJ SOLN
5000.0000 [IU] | Freq: Three times a day (TID) | INTRAMUSCULAR | Status: DC
  Administered 2017-01-31 – 2017-02-04 (×11): 5000 [IU] via SUBCUTANEOUS
  Filled 2017-01-31 (×11): qty 1

## 2017-01-31 MED ORDER — MORPHINE SULFATE (PF) 2 MG/ML IV SOLN
INTRAVENOUS | Status: AC
  Filled 2017-01-31: qty 1

## 2017-01-31 MED ORDER — KCL IN DEXTROSE-NACL 20-5-0.45 MEQ/L-%-% IV SOLN
INTRAVENOUS | Status: DC
  Administered 2017-01-31 – 2017-02-01 (×2): via INTRAVENOUS
  Administered 2017-02-01 (×2): 1000 mL via INTRAVENOUS
  Administered 2017-02-02 – 2017-02-04 (×6): via INTRAVENOUS
  Filled 2017-01-31 (×13): qty 1000

## 2017-01-31 MED ORDER — MORPHINE SULFATE (PF) 2 MG/ML IV SOLN
2.0000 mg | INTRAVENOUS | Status: DC | PRN
  Administered 2017-01-31 – 2017-02-01 (×4): 2 mg via INTRAVENOUS
  Filled 2017-01-31 (×3): qty 1

## 2017-01-31 MED ORDER — FENTANYL CITRATE (PF) 100 MCG/2ML IJ SOLN
50.0000 ug | Freq: Once | INTRAMUSCULAR | Status: AC
  Administered 2017-01-31: 50 ug via INTRAVENOUS
  Filled 2017-01-31: qty 2

## 2017-01-31 MED ORDER — IOPAMIDOL (ISOVUE-300) INJECTION 61%
15.0000 mL | INTRAVENOUS | Status: AC
  Administered 2017-01-31 (×2): 15 mL via ORAL

## 2017-01-31 MED ORDER — ONDANSETRON HCL 4 MG/2ML IJ SOLN
4.0000 mg | Freq: Four times a day (QID) | INTRAMUSCULAR | Status: DC | PRN
  Administered 2017-01-31: 4 mg via INTRAVENOUS

## 2017-01-31 MED ORDER — IOPAMIDOL (ISOVUE-300) INJECTION 61%
75.0000 mL | Freq: Once | INTRAVENOUS | Status: AC | PRN
  Administered 2017-01-31: 75 mL via INTRAVENOUS

## 2017-01-31 NOTE — ED Provider Notes (Signed)
[facility] Emergency Department Provider Note   ____________________________________________   I have reviewed the triage vital signs and the nursing notes.   HISTORY  Chief Complaint Abdominal Pain   History limited by: Not Limited   HPI Hailey Cain is a 71 y.o. female who presents to the emergency department today because of concerns for abdominal pain. It is located below her belly button. It started roughly 36 hours ago. It woke the patient from sleep. She describes it as severe. It has been accompanied by nausea and vomiting. additionally she feels like her abdomen is swollen.She states she has had her normal bowel movements without any diarrhea or blood. No change in urination. She denies similar pain in the past. She does have a history of a cesarean roughly 30 years ago. No fevers although she did have some sweating.   History reviewed. No pertinent past medical history.  Patient Active Problem List   Diagnosis Date Noted  . Prediabetes 08/11/2016  . Bronchitis 05/17/2015  . Anxiety 12/10/2014  . Osteopenia 11/06/2014  . Allergic rhinitis 09/07/2014  . Blood glucose elevated 09/07/2014  . Lacunar infarction ([place]) 09/07/2014  . RAD (reactive airway disease) 09/07/2014  . Cancer of cheek ([place]) 07/04/2007  . Adult hypothyroidism 01/13/2007  . Narrowing of intervertebral disc space 12/30/2006  . Depression, neurotic 12/30/2006  . Acid reflux 12/30/2006  . Fam hx-ischem heart disease 12/30/2006  . History of tobacco use 12/30/2006  . Hypercholesterolemia without hypertriglyceridemia 12/30/2006    Past Surgical History:  Procedure Laterality Date  . ABDOMINAL HYSTERECTOMY  1988   heavy, irregular menses; ovaries intact  . BREAST SURGERY  1970   augmentation  . EYE SURGERY     cataract extraction  . EYE SURGERY  2007   LASIK  . TONSILLECTOMY AND ADENOIDECTOMY  1950    Prior to Admission medications   Medication Sig Start Date End  Date Taking? Authorizing Provider  aspirin 81 MG tablet Take 81 mg by mouth daily.     [provider]  cholecalciferol (VITAMIN D) 400 UNITS TABS tablet Take 400 Units by mouth daily.     [provider]  cyanocobalamin 100 MCG tablet Take 100 mcg by mouth daily.  10/13/13   [provider]  Esomeprazole Magnesium (NEXIUM 24HR PO) Take 1 tablet by mouth daily.  10/05/14   [provider]  FLUoxetine (PROZAC) 20 MG capsule TAKE THREE CAPSULES BY MOUTH ONCE DAILY 09/11/16   Trinna Post, PA-C  levothyroxine (SYNTHROID, LEVOTHROID) 75 MCG tablet Take 1 tablet (75 mcg total) by mouth daily. 09/11/16   Trinna Post, PA-C  loratadine (CLARITIN) 10 MG tablet Take 1 tablet (10 mg total) by mouth daily. Reported on 05/17/2015 05/17/15   Margarita Rana, MD  montelukast (SINGULAIR) 10 MG tablet Take 1 tablet (10 mg total) by mouth at bedtime. Reported on 05/17/2015 05/17/15   Margarita Rana, MD  Multiple Vitamins-Minerals (EMERGEN-C IMMUNE PO) Take by mouth.    [provider]  simvastatin (ZOCOR) 40 MG tablet TAKE ONE TABLET BY MOUTH ONCE DAILY 09/23/16   Trinna Post, PA-C    Allergies Patient has no known allergies.  Family History  Problem Relation Age of Onset  . Stroke Mother   . CAD Mother   . Heart attack Father   . Hyperthyroidism Sister   . Heart Problems Brother   . Cancer Brother        skin  . CAD Brother  CABG    Social History Social History  Substance Use Topics  . Smoking status: Former Smoker    Packs/day: 1.00    Years: 20.00    Quit date: 05/12/2003  . Smokeless tobacco: Never Used  . Alcohol use No    Review of Systems Constitutional: No fever/chills Eyes: No visual changes. ENT: No sore throat. Cardiovascular: Denies chest pain. Respiratory: Denies shortness of breath. Gastrointestinal: positive for abdominal pain and distention  Genitourinary: Negative for dysuria. Musculoskeletal: Negative for back  pain. Skin: Negative for rash. Neurological: Negative for headaches, focal weakness or numbness.  ____________________________________________   PHYSICAL EXAM:  VITAL SIGNS: ED Triage Vitals  Enc Vitals Group     BP 01/31/17 1320 101/63     Pulse Rate 01/31/17 1320 76     Resp 01/31/17 1320 18     Temp 01/31/17 1320 97.8 F (36.6 C)     Temp Source 01/31/17 1320 Oral     SpO2 01/31/17 1320 95 %     Weight --      Height --      Head Circumference --      Peak Flow --      Pain Score 01/31/17 1332 10     Pain Loc --      Pain Edu? --      Excl. in [place]? --      Constitutional: Alert and oriented. Well appearing and in no distress. Eyes: Conjunctivae are normal.  ENT   Head: Normocephalic and atraumatic.   Nose: No congestion/rhinnorhea.   Mouth/Throat: Mucous membranes are moist.   Neck: No stridor. Hematological/Lymphatic/Immunilogical: No cervical lymphadenopathy. Cardiovascular: Normal rate, regular rhythm.  No murmurs, rubs, or gallops.  Respiratory: Normal respiratory effort without tachypnea nor retractions. Breath sounds are clear and equal bilaterally. No wheezes/rales/rhonchi. Gastrointestinal: Soft and somewhat diffusely tender to palpation. Mildly distended and tympanitic. No rebound or guarding. Genitourinary: Deferred Musculoskeletal: Normal range of motion in all extremities. No lower extremity edema. Neurologic:  Normal speech and language. No gross focal neurologic deficits are appreciated.  Skin:  Skin is warm, dry and intact. No rash noted. Psychiatric: Mood and affect are normal. Speech and behavior are normal. Patient exhibits appropriate insight and judgment.  ____________________________________________    LABS (pertinent positives/negatives)  Lipase 27 CBC wnl CMP wnl save for 106 UA without signs of infection  ____________________________________________   EKG  None  ____________________________________________     RADIOLOGY  CT abd/pel Small bowel obstruction, some free fluid around liver.  I, Nance Pear, personally discussed these images and results by phone with the on-call radiologist and used this discussion as part of my medical decision making.   ____________________________________________   PROCEDURES  Procedures  ____________________________________________   INITIAL IMPRESSION / ASSESSMENT AND PLAN / ED COURSE  Pertinent labs & imaging results that were available during my care of the patient were reviewed by me and considered in my medical decision making (see chart for details).  Differential diagnosis includes, but is not limited to, ovarian cyst, ovarian torsion, acute appendicitis, diverticulitis, urinary tract infection/pyelonephritis, endometriosis, bowel obstruction, colitis, renal colic, gastroenteritis, hernia    patient underwent aCT scan to evaluate for abdominal pain and distention. CT scan is consistent with small bowel obstruction. Patient will have NG tube placed. Discussed with surgery who come and evaluate for admission.  ____________________________________________   FINAL CLINICAL IMPRESSION(S) / ED DIAGNOSES  Final diagnoses:  Generalized abdominal pain  Small bowel obstruction (HCC)  Abdominal distension  Nausea  and vomiting, intractability of vomiting not specified, unspecified vomiting type     Note: This dictation was prepared with Dragon dictation. Any transcriptional errors that result from this process are unintentional     Nance Pear, MD 01/31/17 [place]

## 2017-01-31 NOTE — ED Notes (Signed)
Spoke with Dr Burt Knack regarding NG tube placement after pt and family stated it was supposed to be advanced 6 more inches. Dr Burt Knack verbally acknowledged that NG tube should be advanced. NG tube advanced an additional 15 cm and is now 70 cm at the nare.

## 2017-01-31 NOTE — ED Notes (Signed)
Return from CT scan.

## 2017-01-31 NOTE — H&P (Signed)
Hailey Cain is an 71 y.o. female.    Chief Complaint: Nausea and vomiting  HPI: This patient with the acute onset of abdominal pain nausea and vomiting that started yesterday early morning hours approximately 36-39 hours ago. She has never had an episode like this before. She states her pain is much better now that the nasogastric tube is in place and she has vomited again just before the is a gastric tube was placed. She's had no fevers or chills no weight loss. There is no family history of colon cancer. History reviewed. No pertinent past medical history. She does have significant past medical history in the form of hypothyroidism and hypercholesterolemia. She also has some asthma history. She stopped smoking 12 years ago to 15 years ago  Past Surgical History:  Procedure Laterality Date  . ABDOMINAL HYSTERECTOMY  1988   heavy, irregular menses; ovaries intact  . BREAST SURGERY  1970   augmentation  . EYE SURGERY     cataract extraction  . EYE SURGERY  2007   LASIK  . TONSILLECTOMY AND ADENOIDECTOMY  1950  Patient also had a tubal ligation. Her ovaries of been spared.  Family History  Problem Relation Age of Onset  . Stroke Mother   . CAD Mother   . Heart attack Father   . Hyperthyroidism Sister   . Heart Problems Brother   . Cancer Brother        skin  . CAD Brother        CABG   Social History:  reports that she quit smoking about 13 years ago. She has a 20.00 pack-year smoking history. She has never used smokeless tobacco. She reports that she does not drink alcohol or use drugs. 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  Allergies: No Known Allergies   (Not in a hospital admission)   Review of Systems  Constitutional: Negative.   HENT: Negative.   Eyes: Negative.   Respiratory: Negative.   Cardiovascular: Negative.   Gastrointestinal: Positive for abdominal pain, nausea and vomiting. Negative for blood in stool, constipation, diarrhea, heartburn and melena.   Genitourinary: Negative.   Musculoskeletal: Negative.   Skin: Negative.   Neurological: Negative.   Endo/Heme/Allergies: Negative.   Psychiatric/Behavioral: Negative.      Physical Exam:  BP (!) 138/95 (BP Location: Left Arm)   Pulse 91   Temp 97.8 F (36.6 C) (Oral)   Resp 18   SpO2 95%   Physical Exam  Constitutional: She is oriented to person, place, and time and well-developed, well-nourished, and in no distress. No distress.  HENT:  Head: Normocephalic and atraumatic.  Eyes: Pupils are equal, round, and reactive to light. Right eye exhibits no discharge. Left eye exhibits no discharge. No scleral icterus.  Neck: Normal range of motion. No JVD present.  Cardiovascular: Normal rate, regular rhythm and normal heart sounds.   Pulmonary/Chest: Effort normal and breath sounds normal. No respiratory distress. She has no wheezes. She has no rales.  Abdominal: Soft. She exhibits distension. There is no tenderness. There is no rebound and no guarding.  Distended and tympanitic but nontender. Midline infraumbilical scars.  Musculoskeletal: Normal range of motion. She exhibits no edema or tenderness.  Lymphadenopathy:    She has no cervical adenopathy.  Neurological: She is alert and oriented to person, place, and time.  Skin: Skin is warm and dry. No rash noted. She is not diaphoretic. No erythema.  Psychiatric: Mood and affect normal.  Vitals reviewed.  Results for orders placed or performed during the hospital encounter of 01/31/17 (from the past 48 hour(s))  Lipase, blood     Status: None   Collection Time: 01/31/17  1:30 PM  Result Value Ref Range   Lipase 27 11 - 51 U/L  Comprehensive metabolic panel     Status: Abnormal   Collection Time: 01/31/17  1:30 PM  Result Value Ref Range   Sodium 137 135 - 145 mmol/L   Potassium 3.7 3.5 - 5.1 mmol/L   Chloride 102 101 - 111 mmol/L   CO2 28 22 - 32 mmol/L   Glucose, Bld 106 (H) 65 - 99 mg/dL   BUN 15 6 - 20 mg/dL    Creatinine, Ser 0.60 0.44 - 1.00 mg/dL   Calcium 9.4 8.9 - 10.3 mg/dL   Total Protein 6.7 6.5 - 8.1 g/dL   Albumin 3.7 3.5 - 5.0 g/dL   AST 27 15 - 41 U/L   ALT 16 14 - 54 U/L   Alkaline Phosphatase 39 38 - 126 U/L   Total Bilirubin 0.7 0.3 - 1.2 mg/dL   GFR calc non Af Amer >60 >60 mL/min   GFR calc Af Amer >60 >60 mL/min    Comment: (NOTE) The eGFR has been calculated using the CKD EPI equation. This calculation has not been validated in all clinical situations. eGFR's persistently <60 mL/min signify possible Chronic Kidney Disease.    Anion gap 7 5 - 15  CBC     Status: None   Collection Time: 01/31/17  1:30 PM  Result Value Ref Range   WBC 7.4 3.6 - 11.0 K/uL   RBC 4.39 3.80 - 5.20 MIL/uL   Hemoglobin 13.9 12.0 - 16.0 g/dL   HCT 40.8 35.0 - 47.0 %   MCV 92.9 80.0 - 100.0 fL   MCH 31.6 26.0 - 34.0 pg   MCHC 34.0 32.0 - 36.0 g/dL   RDW 13.6 11.5 - 14.5 %   Platelets 239 150 - 440 K/uL  Urinalysis, Complete w Microscopic     Status: Abnormal   Collection Time: 01/31/17  1:30 PM  Result Value Ref Range   Color, Urine YELLOW (A) YELLOW   APPearance CLEAR (A) CLEAR   Specific Gravity, Urine 1.009 1.005 - 1.030   pH 5.0 5.0 - 8.0   Glucose, UA NEGATIVE NEGATIVE mg/dL   Hgb urine dipstick SMALL (A) NEGATIVE   Bilirubin Urine NEGATIVE NEGATIVE   Ketones, ur NEGATIVE NEGATIVE mg/dL   Protein, ur NEGATIVE NEGATIVE mg/dL   Nitrite NEGATIVE NEGATIVE   Leukocytes, UA NEGATIVE NEGATIVE   RBC / HPF 0-5 0 - 5 RBC/hpf   WBC, UA 0-5 0 - 5 WBC/hpf   Bacteria, UA NONE SEEN NONE SEEN   Squamous Epithelial / LPF NONE SEEN NONE SEEN   Dg Abdomen 1 View  Result Date: 01/31/2017 CLINICAL DATA:  NG tube placement. EXAM: ABDOMEN - 1 VIEW COMPARISON:  Current abdomen pelvis CT FINDINGS: Nasogastric tube passes below the diaphragm into the mid stomach, well positioned. Small bowel dilation is similar to the earlier CT scan. IMPRESSION: Nasogastric tube well positioned, tip in the mid  stomach. Electronically Signed   By: Lajean Manes M.D.   On: 01/31/2017 19:19   Ct Abdomen Pelvis W Contrast  Result Date: 01/31/2017 CLINICAL DATA:  Nausea and vomiting. Surgical history of C-section and hysterectomy CT ABDOMEN AND PELVIS WITH CONTRAST TECHNIQUE: Multidetector CT imaging of the abdomen and pelvis was performed using the standard protocol following  bolus administration of intravenous contrast. CONTRAST:  34m ISOVUE-300 IOPAMIDOL (ISOVUE-300) INJECTION 61% COMPARISON:  None. FINDINGS: Lower chest: Lung bases are clear. Hepatobiliary: Small hypodense lesions in the liver likely represent benign cysts. Gallbladder normal. No biliary duct dilatation. Pancreas: Pancreas is normal. No ductal dilatation. No pancreatic inflammation. Spleen: Normal spleen Adrenals/urinary tract: Adrenal glands and kidneys are normal. The ureters and bladder normal. Stomach/Bowel: Stomach is distended with oral contrast. The duodenum is normal. There is dilatation of the proximal small bowel with poor progression of oral contrast. Proximal bowel measures up to 3.5 cm. There is a long segment of distended small bowel with poor progression of the oral contrast. There is a focal waist like narrowing of the small bowel just below the aortic bifurcation (image 49, series 2). This is potential transition point is clearly seen on sagittal image 53 and on coronal image 38, series 5. More distal small bowel is collapsed leading up to the cecum. Mild amount stool throughout the colon rectosigmoid colon. There is a moderate volume of free fluid within the abdomen along the RIGHT pericolic gutter, liver and collecting in the pelvis. Vascular/Lymphatic: Abdominal aorta is normal caliber with atherosclerotic calcification. There is no retroperitoneal or periportal lymphadenopathy. No pelvic lymphadenopathy. Reproductive: Post hysterectomy Other: Moderate volume free fluid as above IMPRESSION: 1. High-grade mechanical small bowel  obstruction. Transition point is in the mid small bowel with a waist like narrowing just inferior to the aortic bifurcation presumably related to postsurgical adhesions. 2. Moderate volume intraperitoneal free fluid. No intraperitoneal free air or pneumatosis. 3.  Atherosclerotic calcification of the aorta. Findings conveyed toGRAYDON GOODMAN on 01/31/2017  at18:42. Electronically Signed   By: SSuzy BouchardM.D.   On: 01/31/2017 18:43     Assessment/Plan  CT scan is personally reviewed. I discussed this patient's care with Dr. DRosana Hoeswho took the original call from Dr. GArchie Balboa We discussed the rationale for admission to the hospital with nasogastric tube decompression and observation. Should she worsen or not progress in a satisfactory manner that surgical intervention would be required for partial small bowel obstruction.  RFlorene Glen MD, FACS

## 2017-01-31 NOTE — ED Triage Notes (Signed)
Patient presents to ED via POV from home with c/o abdominal pain since yesterday patient also reports chills and emesis as well.

## 2017-02-01 ENCOUNTER — Inpatient Hospital Stay: Payer: Medicare HMO

## 2017-02-01 LAB — CBC
HCT: 37.1 % (ref 35.0–47.0)
HEMOGLOBIN: 12.8 g/dL (ref 12.0–16.0)
MCH: 32.1 pg (ref 26.0–34.0)
MCHC: 34.6 g/dL (ref 32.0–36.0)
MCV: 92.7 fL (ref 80.0–100.0)
Platelets: 220 10*3/uL (ref 150–440)
RBC: 4 MIL/uL (ref 3.80–5.20)
RDW: 13.8 % (ref 11.5–14.5)
WBC: 8.2 10*3/uL (ref 3.6–11.0)

## 2017-02-01 LAB — BASIC METABOLIC PANEL
ANION GAP: 6 (ref 5–15)
BUN: 10 mg/dL (ref 6–20)
CALCIUM: 8.6 mg/dL — AB (ref 8.9–10.3)
CO2: 28 mmol/L (ref 22–32)
Chloride: 101 mmol/L (ref 101–111)
Creatinine, Ser: 0.61 mg/dL (ref 0.44–1.00)
Glucose, Bld: 141 mg/dL — ABNORMAL HIGH (ref 65–99)
Potassium: 4 mmol/L (ref 3.5–5.1)
SODIUM: 135 mmol/L (ref 135–145)

## 2017-02-01 MED ORDER — KETOROLAC TROMETHAMINE 15 MG/ML IJ SOLN
15.0000 mg | Freq: Three times a day (TID) | INTRAMUSCULAR | Status: DC | PRN
  Administered 2017-02-01 – 2017-02-03 (×5): 15 mg via INTRAVENOUS
  Filled 2017-02-01 (×5): qty 1

## 2017-02-01 NOTE — Progress Notes (Signed)
CC: Small bowel obstruction Subjective: 71 year old female admitted with a small bowel traction. Patient reports that she is still feeling better than when she presented to the ER. Continues to have intermittent waves of abdominal discomfort but nothing like it was prior to the NG tube placement. Denies any flatus since admission.  Objective: Vital signs in last 24 hours: Temp:  [97.8 F (36.6 C)-98.8 F (37.1 C)] 98.6 F (37 C) (09/24 0316) Pulse Rate:  [70-97] 97 (09/24 0316) Resp:  [16-20] 18 (09/24 0316) BP: (101-138)/(60-95) 117/65 (09/24 0316) SpO2:  [90 %-96 %] 90 % (09/24 0316) Weight:  [50.6 kg (111 lb 9.6 oz)] 50.6 kg (111 lb 9.6 oz) (09/23 2026)    Intake/Output from previous day: 09/23 0701 - 09/24 0700 In: 1335.4 [I.V.:1335.4] Out: 875 [Urine:250; Emesis/NG output:550] Intake/Output this shift: No intake/output data recorded.  Physical exam:  Gen.: No acute distress  chest: Clear to auscultation Heart: Regular rhythm Abdomen: Soft, minimally tender to deep palpation in the midabdomen, mildly distended  Lab Results: CBC   Recent Labs  01/31/17 1330 02/01/17 0256  WBC 7.4 8.2  HGB 13.9 12.8  HCT 40.8 37.1  PLT 239 220   BMET  Recent Labs  01/31/17 1330 02/01/17 0256  NA 137 135  K 3.7 4.0  CL 102 101  CO2 28 28  GLUCOSE 106* 141*  BUN 15 10  CREATININE 0.60 0.61  CALCIUM 9.4 8.6*   PT/INR No results for input(s): LABPROT, INR in the last 72 hours. ABG No results for input(s): PHART, HCO3 in the last 72 hours.  Invalid input(s): PCO2, PO2  Studies/Results: Dg Abd 1 View  Result Date: 01/31/2017 CLINICAL DATA:  NG tube placement EXAM: ABDOMEN - 1 VIEW COMPARISON:  Abdominal radiograph 01/31/2017 FINDINGS: Nasogastric tube tip and side port overlie the stomach. Unchanged bowel gas pattern. Excreted contrast in the urinary bladder. IMPRESSION: NG tube tip and side-port in the stomach. Electronically Signed   By: Ulyses Jarred M.D.   On:  01/31/2017 22:12   Dg Abdomen 1 View  Result Date: 01/31/2017 CLINICAL DATA:  NG tube placement. EXAM: ABDOMEN - 1 VIEW COMPARISON:  Current abdomen pelvis CT FINDINGS: Nasogastric tube passes below the diaphragm into the mid stomach, well positioned. Small bowel dilation is similar to the earlier CT scan. IMPRESSION: Nasogastric tube well positioned, tip in the mid stomach. Electronically Signed   By: Lajean Manes M.D.   On: 01/31/2017 19:19   Ct Abdomen Pelvis W Contrast  Result Date: 01/31/2017 CLINICAL DATA:  Nausea and vomiting. Surgical history of C-section and hysterectomy CT ABDOMEN AND PELVIS WITH CONTRAST TECHNIQUE: Multidetector CT imaging of the abdomen and pelvis was performed using the standard protocol following bolus administration of intravenous contrast. CONTRAST:  11mL ISOVUE-300 IOPAMIDOL (ISOVUE-300) INJECTION 61% COMPARISON:  None. FINDINGS: Lower chest: Lung bases are clear. Hepatobiliary: Small hypodense lesions in the liver likely represent benign cysts. Gallbladder normal. No biliary duct dilatation. Pancreas: Pancreas is normal. No ductal dilatation. No pancreatic inflammation. Spleen: Normal spleen Adrenals/urinary tract: Adrenal glands and kidneys are normal. The ureters and bladder normal. Stomach/Bowel: Stomach is distended with oral contrast. The duodenum is normal. There is dilatation of the proximal small bowel with poor progression of oral contrast. Proximal bowel measures up to 3.5 cm. There is a long segment of distended small bowel with poor progression of the oral contrast. There is a focal waist like narrowing of the small bowel just below the aortic bifurcation (image 49, series 2). This  is potential transition point is clearly seen on sagittal image 53 and on coronal image 38, series 5. More distal small bowel is collapsed leading up to the cecum. Mild amount stool throughout the colon rectosigmoid colon. There is a moderate volume of free fluid within the abdomen  along the RIGHT pericolic gutter, liver and collecting in the pelvis. Vascular/Lymphatic: Abdominal aorta is normal caliber with atherosclerotic calcification. There is no retroperitoneal or periportal lymphadenopathy. No pelvic lymphadenopathy. Reproductive: Post hysterectomy Other: Moderate volume free fluid as above IMPRESSION: 1. High-grade mechanical small bowel obstruction. Transition point is in the mid small bowel with a waist like narrowing just inferior to the aortic bifurcation presumably related to postsurgical adhesions. 2. Moderate volume intraperitoneal free fluid. No intraperitoneal free air or pneumatosis. 3.  Atherosclerotic calcification of the aorta. Findings conveyed toGRAYDON GOODMAN on 01/31/2017  at18:42. Electronically Signed   By: Suzy Bouchard M.D.   On: 01/31/2017 18:43   Dg Abd 2 Views  Result Date: 02/01/2017 CLINICAL DATA:  Year nausea vomiting and abdominal pain. NG tube placed yesterday following admission history of abdominal hysterectomy. EXAM: ABDOMEN - 2 VIEW COMPARISON:  Supine abdominal radiograph of January 31, 2017 FINDINGS: There are loops of minimally distended gas and fluid-filled small bowel in the mid abdomen and right mid to lower abdomen. There are other fluid-filled small bowel loops observed to the left of midline. There is some gas and stool in the ascending colon, the splenic flexure, and in the rectum similar to that seen yesterday. There is contrast within the urinary bladder. The nasogastric tube tip lies in the region of the pylorus. The bony structures exhibit no acute abnormalities. IMPRESSION: Findings compatible with a partial mid to distal small bowel obstruction. There is no evidence of perforation. Electronically Signed   By: David  Martinique M.D.   On: 02/01/2017 07:59    Anti-infectives: Anti-infectives    None      Assessment/Plan:  71 year old female admitted for small bowel obstruction. Clinically improved with NG tube. However still  showing signs of obstruction. Discussed the high likelihood of resolution and that should it fail to resolve then operative intervention would be indicated. She voiced understanding. Continue NG tube for decompression, continue nothing by mouth. Plan for continued serial exams.  Gustav Knueppel T. Adonis Huguenin, MD, [facility] General Surgeon [facility]  [facility] [phone] [facility] [phone] 02/01/2017

## 2017-02-01 NOTE — Progress Notes (Signed)
NG tube out during Pt admit to unit. New NG tube placed and draining clear fluids with brown particles. X ray for placement completed. MD aware of the situation. NG tube inserted to 60 cm, no changes ordered by MD.

## 2017-02-01 NOTE — Progress Notes (Signed)
  Revisited with patient this evening.  Patient reports that she passed flatus earlier today and is feeling somewhat better. Minimal NG tube output over the last 8 hours.  Discussed with the patient that we will recheck labs and an abdominal x-ray in the morning area however should her NG output remained minimal discussed the likelihood of removing her NG tube tomorrow.  Clayburn Pert, MD [facility]  [facility] [phone] [facility] [phone]

## 2017-02-02 ENCOUNTER — Inpatient Hospital Stay: Payer: Medicare HMO

## 2017-02-02 LAB — BASIC METABOLIC PANEL
ANION GAP: 4 — AB (ref 5–15)
BUN: 6 mg/dL (ref 6–20)
CALCIUM: 8.2 mg/dL — AB (ref 8.9–10.3)
CO2: 30 mmol/L (ref 22–32)
Chloride: 105 mmol/L (ref 101–111)
Creatinine, Ser: 0.58 mg/dL (ref 0.44–1.00)
Glucose, Bld: 119 mg/dL — ABNORMAL HIGH (ref 65–99)
Potassium: 4.4 mmol/L (ref 3.5–5.1)
Sodium: 139 mmol/L (ref 135–145)

## 2017-02-02 LAB — CBC
HCT: 34.9 % — ABNORMAL LOW (ref 35.0–47.0)
Hemoglobin: 11.8 g/dL — ABNORMAL LOW (ref 12.0–16.0)
MCH: 31.7 pg (ref 26.0–34.0)
MCHC: 34 g/dL (ref 32.0–36.0)
MCV: 93.4 fL (ref 80.0–100.0)
PLATELETS: 212 10*3/uL (ref 150–440)
RBC: 3.73 MIL/uL — ABNORMAL LOW (ref 3.80–5.20)
RDW: 13.7 % (ref 11.5–14.5)
WBC: 4.9 10*3/uL (ref 3.6–11.0)

## 2017-02-02 MED ORDER — PHENOL 1.4 % MT LIQD
1.0000 | OROMUCOSAL | Status: DC | PRN
  Filled 2017-02-02: qty 177

## 2017-02-02 NOTE — Progress Notes (Signed)
CC: small bowel obstruction Subjective: 71 year old female with a small bowel traction. She reports feeling somewhat better this morning. She reports passing flatus last night. Denies any nausea.  Objective: Vital signs in last 24 hours: Temp:  [97.8 F (36.6 C)-98.6 F (37 C)] 98.6 F (37 C) (09/25 0506) Pulse Rate:  [91-96] 92 (09/25 0506) Resp:  [16-18] 16 (09/25 0506) BP: (120-129)/(53-73) 121/73 (09/25 0506) SpO2:  [92 %-93 %] 92 % (09/25 0506) Last BM Date: 01/30/17  Intake/Output from previous day: 09/24 0701 - 09/25 0700 In: 2838.3 [I.V.:2838.3] Out: 1150 [Urine:800; Emesis/NG output:350] Intake/Output this shift: No intake/output data recorded.  Physical exam:  Gen.: No acute distress Chest: Clear to auscultation  heart: Regular rate and rhythm Abdomen: Soft, nontender, nondistended  Lab Results: CBC   Recent Labs  02/01/17 0256 02/02/17 0308  WBC 8.2 4.9  HGB 12.8 11.8*  HCT 37.1 34.9*  PLT 220 212   BMET  Recent Labs  02/01/17 0256 02/02/17 0308  NA 135 139  K 4.0 4.4  CL 101 105  CO2 28 30  GLUCOSE 141* 119*  BUN 10 6  CREATININE 0.61 0.58  CALCIUM 8.6* 8.2*   PT/INR No results for input(s): LABPROT, INR in the last 72 hours. ABG No results for input(s): PHART, HCO3 in the last 72 hours.  Invalid input(s): PCO2, PO2  Studies/Results: Dg Abd 1 View  Result Date: 01/31/2017 CLINICAL DATA:  NG tube placement EXAM: ABDOMEN - 1 VIEW COMPARISON:  Abdominal radiograph 01/31/2017 FINDINGS: Nasogastric tube tip and side port overlie the stomach. Unchanged bowel gas pattern. Excreted contrast in the urinary bladder. IMPRESSION: NG tube tip and side-port in the stomach. Electronically Signed   By: Ulyses Jarred M.D.   On: 01/31/2017 22:12   Dg Abdomen 1 View  Result Date: 01/31/2017 CLINICAL DATA:  NG tube placement. EXAM: ABDOMEN - 1 VIEW COMPARISON:  Current abdomen pelvis CT FINDINGS: Nasogastric tube passes below the diaphragm into the mid  stomach, well positioned. Small bowel dilation is similar to the earlier CT scan. IMPRESSION: Nasogastric tube well positioned, tip in the mid stomach. Electronically Signed   By: Lajean Manes M.D.   On: 01/31/2017 19:19   Ct Abdomen Pelvis W Contrast  Result Date: 01/31/2017 CLINICAL DATA:  Nausea and vomiting. Surgical history of C-section and hysterectomy CT ABDOMEN AND PELVIS WITH CONTRAST TECHNIQUE: Multidetector CT imaging of the abdomen and pelvis was performed using the standard protocol following bolus administration of intravenous contrast. CONTRAST:  91mL ISOVUE-300 IOPAMIDOL (ISOVUE-300) INJECTION 61% COMPARISON:  None. FINDINGS: Lower chest: Lung bases are clear. Hepatobiliary: Small hypodense lesions in the liver likely represent benign cysts. Gallbladder normal. No biliary duct dilatation. Pancreas: Pancreas is normal. No ductal dilatation. No pancreatic inflammation. Spleen: Normal spleen Adrenals/urinary tract: Adrenal glands and kidneys are normal. The ureters and bladder normal. Stomach/Bowel: Stomach is distended with oral contrast. The duodenum is normal. There is dilatation of the proximal small bowel with poor progression of oral contrast. Proximal bowel measures up to 3.5 cm. There is a long segment of distended small bowel with poor progression of the oral contrast. There is a focal waist like narrowing of the small bowel just below the aortic bifurcation (image 49, series 2). This is potential transition point is clearly seen on sagittal image 53 and on coronal image 38, series 5. More distal small bowel is collapsed leading up to the cecum. Mild amount stool throughout the colon rectosigmoid colon. There is a moderate volume of free  fluid within the abdomen along the RIGHT pericolic gutter, liver and collecting in the pelvis. Vascular/Lymphatic: Abdominal aorta is normal caliber with atherosclerotic calcification. There is no retroperitoneal or periportal lymphadenopathy. No pelvic  lymphadenopathy. Reproductive: Post hysterectomy Other: Moderate volume free fluid as above IMPRESSION: 1. High-grade mechanical small bowel obstruction. Transition point is in the mid small bowel with a waist like narrowing just inferior to the aortic bifurcation presumably related to postsurgical adhesions. 2. Moderate volume intraperitoneal free fluid. No intraperitoneal free air or pneumatosis. 3.  Atherosclerotic calcification of the aorta. Findings conveyed toGRAYDON GOODMAN on 01/31/2017  at18:42. Electronically Signed   By: Suzy Bouchard M.D.   On: 01/31/2017 18:43   Dg Abd 2 Views  Result Date: 02/01/2017 CLINICAL DATA:  Year nausea vomiting and abdominal pain. NG tube placed yesterday following admission history of abdominal hysterectomy. EXAM: ABDOMEN - 2 VIEW COMPARISON:  Supine abdominal radiograph of January 31, 2017 FINDINGS: There are loops of minimally distended gas and fluid-filled small bowel in the mid abdomen and right mid to lower abdomen. There are other fluid-filled small bowel loops observed to the left of midline. There is some gas and stool in the ascending colon, the splenic flexure, and in the rectum similar to that seen yesterday. There is contrast within the urinary bladder. The nasogastric tube tip lies in the region of the pylorus. The bony structures exhibit no acute abnormalities. IMPRESSION: Findings compatible with a partial mid to distal small bowel obstruction. There is no evidence of perforation. Electronically Signed   By: David  Martinique M.D.   On: 02/01/2017 07:59   Dg Abd Portable 2v  Result Date: 02/02/2017 CLINICAL DATA:  Small bowel obstruction. EXAM: PORTABLE ABDOMEN - 2 VIEW COMPARISON:  Radiographs yesterday.  CT 01/31/2017 FINDINGS: Enteric tube remains in place, tip and side-port below the diaphragm. Enteric contrast is in the ascending, transverse and proximal descending colon. Persistent but improving gaseous distention of small bowel. No free air.  Bibasilar atelectasis. IMPRESSION: Improving small bowel obstruction with persistent but improving small bowel dilatation. Enteric contrast within the ascending, transverse and proximal descending colon. Electronically Signed   By: Jeb Levering M.D.   On: 02/02/2017 05:39    Anti-infectives: Anti-infectives    None      Assessment/Plan:  71 year old female with a small bowel obstruction. Clinically and radiographically improved. Discussedperforming a clamp trial today. NG tube was placed to being clamped and set obstruction. Counseled her extensively should her abdominal pain or nausea returned to inform the nursing staff immediately so she can be placed back to suction. Should she pass her clamp trial her NG tube within be removed.  Olsen Mccutchan T. Adonis Huguenin, MD, [facility] General Surgeon [facility]  [facility] [phone] [facility] [phone] 02/02/2017

## 2017-02-03 ENCOUNTER — Inpatient Hospital Stay: Payer: Medicare HMO

## 2017-02-03 LAB — BASIC METABOLIC PANEL
ANION GAP: 3 — AB (ref 5–15)
BUN: <5 mg/dL — ABNORMAL LOW (ref 6–20)
CALCIUM: 8.8 mg/dL — AB (ref 8.9–10.3)
CO2: 31 mmol/L (ref 22–32)
Chloride: 107 mmol/L (ref 101–111)
Creatinine, Ser: 0.52 mg/dL (ref 0.44–1.00)
GLUCOSE: 112 mg/dL — AB (ref 65–99)
POTASSIUM: 3.8 mmol/L (ref 3.5–5.1)
SODIUM: 141 mmol/L (ref 135–145)

## 2017-02-03 LAB — CBC
HCT: 36.7 % (ref 35.0–47.0)
Hemoglobin: 12.6 g/dL (ref 12.0–16.0)
MCH: 32.4 pg (ref 26.0–34.0)
MCHC: 34.4 g/dL (ref 32.0–36.0)
MCV: 94.3 fL (ref 80.0–100.0)
PLATELETS: 218 10*3/uL (ref 150–440)
RBC: 3.89 MIL/uL (ref 3.80–5.20)
RDW: 13.4 % (ref 11.5–14.5)
WBC: 5.5 10*3/uL (ref 3.6–11.0)

## 2017-02-03 NOTE — Progress Notes (Signed)
CC: Small bowel obstruction Subjective: Patient failed her NG clamped for a yesterday and was returned to suction overnight. She reports increased flatus and bowel function overnight. States her abdomen feels much better today.  Objective: Vital signs in last 24 hours: Temp:  [98 F (36.7 C)-98.5 F (36.9 C)] 98 F (36.7 C) (09/26 0413) Pulse Rate:  [75-94] 75 (09/26 0413) Resp:  [18-20] 18 (09/26 0413) BP: (127-142)/(52-61) 127/61 (09/26 0413) SpO2:  [95 %-97 %] 97 % (09/26 0413) Last BM Date: 01/30/17  Intake/Output from previous day: 09/25 0701 - 09/26 0700 In: 2709 [I.V.:2709] Out: 2550 [Urine:2100; Emesis/NG output:450] Intake/Output this shift: Total I/O In: 772.9 [I.V.:772.9] Out: -   Physical exam:  Gen.: No acute distress  chest: Clear to auscultation  heart: Regular rhythm Abdomen: Soft, nontender, nondistended  Lab Results: CBC   Recent Labs  02/02/17 0308 02/03/17 0523  WBC 4.9 5.5  HGB 11.8* 12.6  HCT 34.9* 36.7  PLT 212 218   BMET  Recent Labs  02/02/17 0308 02/03/17 0523  NA 139 141  K 4.4 3.8  CL 105 107  CO2 30 31  GLUCOSE 119* 112*  BUN 6 <5*  CREATININE 0.58 0.52  CALCIUM 8.2* 8.8*   PT/INR No results for input(s): LABPROT, INR in the last 72 hours. ABG No results for input(s): PHART, HCO3 in the last 72 hours.  Invalid input(s): PCO2, PO2  Studies/Results: Dg Abd Acute W/chest  Result Date: 02/03/2017 CLINICAL DATA:  Follow-up small bowel obstruction EXAM: DG ABDOMEN ACUTE W/ 1V CHEST COMPARISON:  Abdominal series of February 02, 2017 FINDINGS: The lungs remain hyperinflated. Bibasilar densities persist. The heart and pulmonary vascularity are normal. An esophagogastric tube is present whose proximal port and tip project in the stomach. There is dense calcification in bilateral breast implants. Within the abdomen there are loops of moderately distended gas and fluid-filled small bowel. No free extraluminal gas collections are  observed. There is contrast, stool, and gas in normal calibered colon. There is a small amount of gas in the rectum. IMPRESSION: Persistent mid to distal partial small bowel obstruction. No evidence of perforation. Stable chronic changes within both lungs. Electronically Signed   By: David  Martinique M.D.   On: 02/03/2017 07:39   Dg Abd Portable 2v  Result Date: 02/02/2017 CLINICAL DATA:  Small bowel obstruction. EXAM: PORTABLE ABDOMEN - 2 VIEW COMPARISON:  Radiographs yesterday.  CT 01/31/2017 FINDINGS: Enteric tube remains in place, tip and side-port below the diaphragm. Enteric contrast is in the ascending, transverse and proximal descending colon. Persistent but improving gaseous distention of small bowel. No free air. Bibasilar atelectasis. IMPRESSION: Improving small bowel obstruction with persistent but improving small bowel dilatation. Enteric contrast within the ascending, transverse and proximal descending colon. Electronically Signed   By: Jeb Levering M.D.   On: 02/02/2017 05:39    Anti-infectives: Anti-infectives    None      Assessment/Plan:  71 year old female with a small bowel obstruction. Clinically much better even though her x-ray does not show much improvement. Discussed that we would attempt a clamp trial again today. Should she fail it discussed the possibility of needing an operation. She voiced understanding we will follow back later today to see whether or not her NG tube to be removed.  Sephira Zellman T. Adonis Huguenin, MD, [facility] General Surgeon [facility]  Day [facility] [phone] Night ASC[facility] [phone] 02/03/2017

## 2017-02-04 NOTE — Care Management Important Message (Signed)
Important Message  Patient Details  Name: MATHILDA MAGUIRE MRN: [id number] Date of Birth: 01-10-1946   Medicare Important Message Given:  Yes    Beverly Sessions, RN 02/04/2017, 2:24 PM

## 2017-02-04 NOTE — Progress Notes (Signed)
Discharge instructions reviewed with the patient.  Patient sent out via wheelchair to her waiting car with all belongings

## 2017-02-04 NOTE — Discharge Summary (Signed)
Patient ID: LIBRADA CASTRONOVO MRN: [id number] DOB/AGE: 06-24-1945 71 y.o.  Admit date: 01/31/2017 Discharge date: 02/04/2017  Discharge Diagnoses:  Small bowel obstruction  Procedures Performed: NG Decompression  Discharged Condition: good  Hospital Course: Patient admitted with a small bowel obstruction. Had resolution of her symptoms with NG decompression. On day of discharge she tolerated a regular diet and had a benign abdomen.  Discharge Orders: Discharge Instructions    Call MD for:  persistant nausea and vomiting    Complete by:  As directed    Call MD for:  severe uncontrolled pain    Complete by:  As directed    Call MD for:  temperature >100.4    Complete by:  As directed    Diet - low sodium heart healthy    Complete by:  As directed    Increase activity slowly    Complete by:  As directed       Disposition: Final discharge disposition not confirmed  Discharge Medications: Allergies as of 02/04/2017   No Known Allergies     Medication List    TAKE these medications   aspirin 81 MG tablet Take 81 mg by mouth daily.   cholecalciferol 400 units Tabs tablet Commonly known as:  VITAMIN D Take 400 Units by mouth daily.   cyanocobalamin 100 MCG tablet Take 100 mcg by mouth daily.   EMERGEN-C IMMUNE PO Take 1 tablet by mouth daily.   FLUoxetine 20 MG capsule Commonly known as:  PROZAC TAKE THREE CAPSULES BY MOUTH ONCE DAILY   levothyroxine 75 MCG tablet Commonly known as:  SYNTHROID, LEVOTHROID Take 1 tablet (75 mcg total) by mouth daily.   loratadine 10 MG tablet Commonly known as:  CLARITIN Take 1 tablet (10 mg total) by mouth daily. Reported on 05/17/2015   montelukast 10 MG tablet Commonly known as:  SINGULAIR Take 1 tablet (10 mg total) by mouth at bedtime. Reported on 05/17/2015   ranitidine 150 MG tablet Commonly known as:  ZANTAC Take 150 mg by mouth daily as needed for heartburn.   simvastatin 40 MG tablet Commonly known as:  ZOCOR TAKE  ONE TABLET BY MOUTH ONCE DAILY What changed:  See the new instructions.            Discharge Care Instructions        Start     Ordered   02/04/17 0000  Increase activity slowly     02/04/17 1348   02/04/17 0000  Diet - low sodium heart healthy     02/04/17 1348   02/04/17 0000  Call MD for:  temperature >100.4     02/04/17 1348   02/04/17 0000  Call MD for:  persistant nausea and vomiting     02/04/17 1348   02/04/17 0000  Call MD for:  severe uncontrolled pain     02/04/17 1348       Follwup: Follow-up Information    [place]. Call in 1 week(s).   Contact information: [address] [place] [address] [place] [employer] [place] [address] [place]. Call.   Specialty:  General Surgery Why:  Only if questions or concerns Contact information: [place] [phone]          Signed: Clayburn Pert 02/04/2017, 1:50 PM

## 2017-02-04 NOTE — Discharge Instructions (Signed)
Small Bowel Obstruction °A small bowel obstruction is a blockage in the small bowel. The small bowel, which is also called the small intestine, is a long, slender tube that connects the stomach to the colon. When a person eats and drinks, food and fluids go from the stomach to the small bowel. This is where most of the nutrients in the food and fluids are absorbed. °A small bowel obstruction will prevent food and fluids from passing through the small bowel as they normally do during digestion. The small bowel can become partially or completely blocked. This can cause symptoms such as abdominal pain, vomiting, and bloating. If this condition is not treated, it can be dangerous because the small bowel could rupture. °What are the causes? °Common causes of this condition include: °· Scar tissue from previous surgery or radiation treatment. °· Recent surgery. This may cause the movements of the bowel to slow down and cause food to block the intestine. °· Hernias. °· Inflammatory bowel disease (colitis). °· Twisting of the bowel (volvulus). °· Tumors. °· A foreign body. °· Slipping of a part of the bowel into another part (intussusception). °What are the signs or symptoms? °Symptoms of this condition include: °· Abdominal pain. This may be dull cramps or sharp pain. It may occur in one area, or it may be present in the entire abdomen. Pain can range from mild to severe, depending on the degree of obstruction. °· Nausea and vomiting. Vomit may be greenish or a yellow bile color. °· Abdominal bloating. °· Constipation. °· Lack of passing gas. °· Frequent belching. °· Diarrhea. This may occur if the obstruction is partial and runny stool is able to leak around the obstruction. °How is this diagnosed? °This condition may be diagnosed based on a physical exam, medical history, and X-rays of the abdomen. You may also have other tests, such as a CT scan of the abdomen and pelvis. °How is this treated? °Treatment for this  condition depends on the cause and severity of the problem. Treatment options may include: °· Bed rest along with fluids and pain medicines that are given through an IV tube inserted into one of your veins. Sometimes, this is all that is needed for the obstruction to improve. °· Following a simple diet. In some cases, a clear liquid diet may be required for several days. This allows the bowel to rest. °· Placement of a small tube (nasogastric tube) into the stomach. When the bowel is blocked, it usually swells up like a balloon that is filled with air and fluids. The air and fluids may be removed by suction through the nasogastric tube. This can help with pain, discomfort, and nausea. It can also help the obstruction to clear up faster. °· Surgery. This may be required if other treatments do not work. Bowel obstruction from a hernia may require early surgery and can be an emergency procedure. Surgery may also be required for scar tissue that causes frequent or severe obstructions. °Follow these instructions at home: °· Get plenty of rest. °· Follow instructions from your health care provider about eating restrictions. You may need to avoid solid foods and consume only clear liquids until your condition improves. °· Take over-the-counter and prescription medicines only as told by your health care provider. °· Keep all follow-up visits as told by your health care provider. This is important. °Contact a health care provider if: °· You have a fever. °· You have chills. °Get help right away if: °· You have increased   pain or cramping. °· You vomit blood. °· You have uncontrolled vomiting or nausea. °· You cannot drink fluids because of vomiting or pain. °· You develop confusion. °· You begin feeling very dry or thirsty (dehydrated). °· You have severe bloating. °· You feel extremely weak or you faint. °This information is not intended to replace advice given to you by your health care provider. Make sure you discuss any  questions you have with your health care provider. °Document Released: 07/14/2005 Document Revised: 12/23/2015 Document Reviewed: 06/21/2014 °Elsevier Interactive Patient Education © 2017 Elsevier Inc. ° °

## 2017-02-04 NOTE — Progress Notes (Signed)
Order received earlier from Dr Adonis Huguenin to change diet to regular for lunch if patient tolerated clear liquids for breakfast.  Diet changed to regular

## 2017-02-04 NOTE — Progress Notes (Signed)
CC: Small bowel obstruction Subjective: Patient tolerated removal of her NG tube yesterday afternoon without difficulty. Had uneventful night without any nausea or abdominal pain. Continues to pass flatus.  Objective: Vital signs in last 24 hours: Temp:  [97.7 F (36.5 C)-98.1 F (36.7 C)] 97.7 F (36.5 C) (09/27 0429) Pulse Rate:  [62-76] 62 (09/27 0429) Resp:  [18-19] 18 (09/27 0429) BP: (113-125)/(55-68) 113/55 (09/27 0429) SpO2:  [97 %-98 %] 97 % (09/27 0429) Last BM Date: 01/30/17  Intake/Output from previous day: 09/26 0701 - 09/27 0700 In: 2694.7 [I.V.:2694.7] Out: 1050 [Urine:1050] Intake/Output this shift: Total I/O In: -  Out: 500 [Urine:500]  Physical exam:  Gen.: No acute distress  chest: Clear to auscultation Heart: Regular rate and rhythm Abdomen: Soft and nontender  Lab Results: CBC   Recent Labs  02/02/17 0308 02/03/17 0523  WBC 4.9 5.5  HGB 11.8* 12.6  HCT 34.9* 36.7  PLT 212 218   BMET  Recent Labs  02/02/17 0308 02/03/17 0523  NA 139 141  K 4.4 3.8  CL 105 107  CO2 30 31  GLUCOSE 119* 112*  BUN 6 <5*  CREATININE 0.58 0.52  CALCIUM 8.2* 8.8*   PT/INR No results for input(s): LABPROT, INR in the last 72 hours. ABG No results for input(s): PHART, HCO3 in the last 72 hours.  Invalid input(s): PCO2, PO2  Studies/Results: Dg Abd Acute W/chest  Result Date: 02/03/2017 CLINICAL DATA:  Follow-up small bowel obstruction EXAM: DG ABDOMEN ACUTE W/ 1V CHEST COMPARISON:  Abdominal series of February 02, 2017 FINDINGS: The lungs remain hyperinflated. Bibasilar densities persist. The heart and pulmonary vascularity are normal. An esophagogastric tube is present whose proximal port and tip project in the stomach. There is dense calcification in bilateral breast implants. Within the abdomen there are loops of moderately distended gas and fluid-filled small bowel. No free extraluminal gas collections are observed. There is contrast, stool, and gas  in normal calibered colon. There is a small amount of gas in the rectum. IMPRESSION: Persistent mid to distal partial small bowel obstruction. No evidence of perforation. Stable chronic changes within both lungs. Electronically Signed   By: David  Martinique M.D.   On: 02/03/2017 07:39    Anti-infectives: Anti-infectives    None      Assessment/Plan:  71 year old female admitted with a small bowel obstruction that appears to resolve. Plan to start her on clear liquids this morning. If tolerates liquids will advance to regular diet for lunch. If tolerates regular lunch with the ready for discharge. Encourage ambulation.  Felton Buczynski T. Adonis Huguenin, MD, [facility] General Surgeon [facility]  Day [facility] [phone] Night ASCOM [phone] 02/04/2017

## 2017-02-08 DIAGNOSIS — R69 Illness, unspecified: Secondary | ICD-10-CM | POA: Diagnosis not present

## 2017-02-10 ENCOUNTER — Other Ambulatory Visit: Payer: Self-pay

## 2017-02-10 NOTE — Patient Outreach (Signed)
[place] [facility]) Care Management  02/10/2017  ELBERT POLYAKOV [birth year] [id number]     EMMI-General Discharge RED ON EMMI ALERT Day # 4 Date: 02/09/17 Red Alert Reason: "Lost interest in things? Yes"   Outreach attempt #1 to patient. Spoke with patient who voices she was currently out at the store. RN CM will f/u with patient and call back at a later time.         Plan: RN CM will make outreach attempt to patient within one business day to patient.    Enzo Montgomery, RN,BSN,CCM [place] [facility] Telephonic Care Management Coordinator Direct Phone: [phone] Toll Free: [phone] Fax: [phone]

## 2017-02-11 ENCOUNTER — Ambulatory Visit (INDEPENDENT_AMBULATORY_CARE_PROVIDER_SITE_OTHER): Payer: Medicare HMO | Admitting: Physician Assistant

## 2017-02-11 ENCOUNTER — Telehealth: Payer: Self-pay

## 2017-02-11 ENCOUNTER — Other Ambulatory Visit: Payer: Self-pay

## 2017-02-11 ENCOUNTER — Ambulatory Visit
Admission: RE | Admit: 2017-02-11 | Discharge: 2017-02-11 | Disposition: A | Discharge status: Home / Self Care | Payer: Medicare HMO | Source: Ambulatory Visit | Attending: Physician Assistant | Admitting: Physician Assistant

## 2017-02-11 ENCOUNTER — Encounter: Payer: Self-pay | Admitting: Physician Assistant

## 2017-02-11 VITALS — BP 108/62 | HR 72 | Temp 98.2°F | Resp 16 | Wt 109.0 lb

## 2017-02-11 DIAGNOSIS — K56609 Unspecified intestinal obstruction, unspecified as to partial versus complete obstruction: Secondary | ICD-10-CM | POA: Insufficient documentation

## 2017-02-11 DIAGNOSIS — Z09 Encounter for follow-up examination after completed treatment for conditions other than malignant neoplasm: Secondary | ICD-10-CM | POA: Diagnosis not present

## 2017-02-11 DIAGNOSIS — R109 Unspecified abdominal pain: Secondary | ICD-10-CM | POA: Diagnosis not present

## 2017-02-11 NOTE — Progress Notes (Signed)
Patient: Hailey Cain Female    DOB: 09-Apr-1946   71 y.o.   MRN: [id number] Visit Date: 02/11/2017  Today's Provider: Trinna Post, PA-C   Chief Complaint  Patient presents with  . Hospitalization Follow-up   Subjective:      Hailey Cain is a 71 y/o woman with history of total abdominal hysterectomy and C-section who is presenting today for hospital follow up for SBO. She presented to the ER on 01/31/2017 with severe abdominal pain, nausea and vomiting. Abdominal plain film and CT revealed small bowel obstruction that was thought to be related to surgical adhesions. She was maintained on NG decompression for several days with slow improvement. Surgical management was discussed, but ultimately deferred as patient clinically improved. Her abdominal plain films however as recently as 02/03/2017 revealed an improved but persistent small bowel obstruction  In office today, she reports she had a bowel movement on the day of discharge but none since then. She reports she is passing small amounts of flatus. She reports abdominal distention such that she couldn't put on her jeans and had to wear yoga pants today. She reports nausea without vomiting, and feeling "kind of miserable," with continued abdominal pain. She reports tolerating PO intake. She has taken a stool softener to aid with bowel movements but this has provided no relief.   Constipation  This is a new problem. The current episode started in the past 7 days. The problem is unchanged. Stool frequency: Pt reports she has not had a bowel movement in a week. There has been adequate water intake. Associated symptoms include abdominal pain, bloating and nausea. Pertinent negatives include no anorexia, diarrhea, difficulty urinating, fecal incontinence, fever, rectal pain, vomiting or weight loss. She has tried stool softeners for the symptoms. The treatment provided no relief.      Follow up Hospitalization  Patient was  admitted to [facility] on 01/31/2017 and discharged on 02/04/2017. She was treated for small Bowel obstruction. Treatment for this included NG decompression. She reports excellent compliance with treatment. She reports this condition is Improved.  Pt feels better but she reports not having a bowel movement since being discharged from the hospital.  ------------------------------------------------------------------------------------        No Known Allergies   Current Outpatient Prescriptions:  .  aspirin 81 MG tablet, Take 81 mg by mouth daily. , Disp: , Rfl:  .  cholecalciferol (VITAMIN D) 400 UNITS TABS tablet, Take 400 Units by mouth daily. , Disp: , Rfl:  .  cyanocobalamin 100 MCG tablet, Take 100 mcg by mouth daily. , Disp: , Rfl:  .  FLUoxetine (PROZAC) 20 MG capsule, TAKE THREE CAPSULES BY MOUTH ONCE DAILY, Disp: 270 capsule, Rfl: 1 .  levothyroxine (SYNTHROID, LEVOTHROID) 75 MCG tablet, Take 1 tablet (75 mcg total) by mouth daily., Disp: 90 tablet, Rfl: 1 .  loratadine (CLARITIN) 10 MG tablet, Take 1 tablet (10 mg total) by mouth daily. Reported on 05/17/2015, Disp: 30 tablet, Rfl: 5 .  montelukast (SINGULAIR) 10 MG tablet, Take 1 tablet (10 mg total) by mouth at bedtime. Reported on 05/17/2015, Disp: 30 tablet, Rfl: 5 .  Multiple Vitamins-Minerals (EMERGEN-C IMMUNE PO), Take 1 tablet by mouth daily. , Disp: , Rfl:  .  ranitidine (ZANTAC) 150 MG tablet, Take 150 mg by mouth daily as needed for heartburn., Disp: , Rfl:  .  simvastatin (ZOCOR) 40 MG tablet, TAKE ONE TABLET BY MOUTH ONCE DAILY (Patient taking differently: TAKE 60 MG  BY MOUTH ONCE DAILY), Disp: 90 tablet, Rfl: 1  Review of Systems  Constitutional: Positive for fatigue. Negative for activity change, appetite change, chills, diaphoresis, fever, unexpected weight change and weight loss.  Respiratory: Negative.   Cardiovascular: Negative.   Gastrointestinal: Positive for abdominal pain, bloating, constipation and nausea.  Negative for abdominal distention, anal bleeding, anorexia, blood in stool, diarrhea, rectal pain and vomiting.  Genitourinary: Negative for difficulty urinating.    Social History  Substance Use Topics  . Smoking status: Former Smoker    Packs/day: 1.00    Years: 20.00    Quit date: 05/12/2003  . Smokeless tobacco: Never Used  . Alcohol use No   Objective:   BP 108/62 (BP Location: Left Arm, Patient Position: Sitting, Cuff Size: Normal)   Pulse 72   Temp 98.2 F (36.8 C) (Oral)   Resp 16   Wt 109 lb (49.4 kg)   BMI 20.60 kg/m  Vitals:   02/11/17 1417  BP: 108/62  Pulse: 72  Resp: 16  Temp: 98.2 F (36.8 C)  TempSrc: Oral  Weight: 109 lb (49.4 kg)     Physical Exam  Constitutional: She is oriented to person, place, and time. She appears well-developed and well-nourished.  Cardiovascular: Normal rate and regular rhythm.   Pulmonary/Chest: Effort normal and breath sounds normal.  Abdominal: Soft. She exhibits distension. Bowel sounds are decreased. There is generalized tenderness. There is no rebound.  Abdomen diffusely resonant to percussion.  Neurological: She is alert and oriented to person, place, and time.  Skin: Skin is warm and dry.  Psychiatric: She has a normal mood and affect. Her behavior is normal.        Assessment & Plan:     1. Hospital discharge follow-up  I have reviewed the hospital notes and discharge summary. I have reviewed the labwork and imaging during the course of her admission. I have reconciled her hospital discharge and current medications.  2. Small bowel obstruction ([place])  Concerned about patient as she had clinical improvement during hospitalization but last abdominal plain film revealed persistent SBO. She has not had bowel movement in one week, has increased abdominal distention and nausea. Does have decreased but not absent bowel sounds. Recommend patient return to ER but she is hesitant. I have called [place]  as she was seen by Dr. Adonis Huguenin when she was in the hospital. They have an appointment for her tomorrow at 9:00 AM with Dr. Adonis Huguenin. I have alerted her of this and if she has severe abdominal pain/vomiting, she needs to be seen in the ER.   - DG Abd 2 Views; Future  Return if symptoms worsen or fail to improve.  The entirety of the information documented in the History of Present Illness, Review of Systems and Physical Exam were personally obtained by me. Portions of this information were initially documented by Ashley Royalty, CMA and reviewed by me for thoroughness and accuracy.        Trinna Post, PA-C  [facility]

## 2017-02-11 NOTE — Patient Outreach (Signed)
[place] [facility]) Care Management  02/11/2017  Hailey Cain 09-12-1945 [id number]     EMMI-General Discharge RED ON EMMI ALERT Day # 4 Date: 02/09/17 Red Alert Reason: "Lost interest in things? Yes"   Outreach attempt #2 to patient. Spoke with patient. She is currently out and about so requested brief call. RN CM reviewed and addressed red alert. Patient denies symptoms. She states she went and SW PCP for f/u appt today. PCP has advised her to go back to see surgeon on tomorrow. She voices that PCP was a little concerned about her stomach being distended and that patient has not had BM in several days. She voices she is taking stool softener daily. RN CM provided patient with some other nonpharmacologic measures to help with BM. She voices she will try these measures. She will adhere to PCP recommendation and go see surgeon on tomorrow. She voices no further RN CM needs or concerns at this time.     Plan: RN CM will notify [facility] administrative assistant of case status.     Enzo Montgomery, RN,BSN,CCM [place] [facility] Telephonic Care Management Coordinator Direct Phone: [phone] Toll Free: [phone] Fax: [phone]

## 2017-02-11 NOTE — Progress Notes (Signed)
This encounter was created in error - please disregard.

## 2017-02-11 NOTE — Patient Instructions (Signed)
[facility], 9:00 on Friday, 02/12/2017 [place], [place], [place] [address]   Small Bowel Obstruction A small bowel obstruction means that something is blocking the small bowel. The small bowel is also called the small intestine. It is the long tube that connects the stomach to the colon. An obstruction will stop food and fluids from passing through the small bowel. Treatment depends on what is causing the problem and how bad the problem is. Follow these instructions at home:  Get a lot of rest.  Follow your diet as told by your doctor. You may need to: ? Only drink clear liquids until you start to get better. ? Avoid solid foods as told by your doctor.  Take over-the-counter and prescription medicines only as told by your doctor.  Keep all follow-up visits as told by your doctor. This is important. Contact a doctor if:  You have a fever.  You have chills. Get help right away if:  You have pain or cramps that get worse.  You throw up (vomit) blood.  You have a feeling of being sick to your stomach (nausea) that does not go away.  You cannot stop throwing up.  You cannot drink fluids.  You feel confused.  You feel dry or thirsty (dehydrated).  Your belly gets more bloated.  You feel weak or you pass out (faint). This information is not intended to replace advice given to you by your health care provider. Make sure you discuss any questions you have with your health care provider. Document Released: 06/04/2004 Document Revised: 12/23/2015 Document Reviewed: 06/21/2014 Elsevier Interactive Patient Education  [employer].

## 2017-02-11 NOTE — Telephone Encounter (Signed)
Patient's primary care doctor called at this time. She stated the patient is still have some nausea not vomiting. Has yet to have a bowel movement and still has some extending in her abdomin. She stated that she was trying to convince the patient to come back to the hospital but the patient refused and would rather be seen by Dr. Adonis Huguenin. I scheduled the patient to be seen tomorrow to see Dr. Adonis Huguenin.

## 2017-02-12 ENCOUNTER — Ambulatory Visit (INDEPENDENT_AMBULATORY_CARE_PROVIDER_SITE_OTHER): Payer: Medicare HMO | Admitting: General Surgery

## 2017-02-12 ENCOUNTER — Encounter: Payer: Self-pay | Admitting: General Surgery

## 2017-02-12 VITALS — BP 119/72 | HR 80 | Temp 98.1°F | Ht 61.0 in | Wt 110.0 lb

## 2017-02-12 DIAGNOSIS — Z09 Encounter for follow-up examination after completed treatment for conditions other than malignant neoplasm: Secondary | ICD-10-CM

## 2017-02-12 NOTE — Patient Instructions (Addendum)
You will need to purchase the below items at [facility] or any drug store.  Doculax Supossitories Miralax  1 bottle of Magnesium Citrate Drink 72 ounces of water daily    We would like for you to use the Suppository as soon as you get home. Please hold this in for as long as you can.  Take a dose of the Miralax  As well.  Start increasing your water intake.    If you do not have any results today then Drink the bottle of the Magnesium Citrate Saturday. Continue to drink water throughout the day.   If you do not have results from this then go to the Emergency room.    Please call our office if you have any questions or concerns. PLease call on Monday to let us know how you are doing and if you are no better we can make a follow up appointment.

## 2017-02-12 NOTE — Progress Notes (Signed)
Outpatient Surgical Follow Up  02/12/2017  Hailey Cain is an 71 y.o. female.   Chief Complaint  Patient presents with  . New Patient (Initial Visit)    Seen in ED-[facility] 01/31/17-Dr.Vetra Shinall    HPI: 71 year old female reports to clinic for follow-up from recent hospitalization for small bowel obstruction. Patient reports she had a bowel movement on the day of discharge but has not had one since. She has had subjective nausea without vomiting. She has had intermittent abdominal distention. She is passing gas but has not passed any stool. She states her abdomen continues to be sore. She denies any fevers, chills, chest pain, shortness of breath. He was seen by her primary care and discussed with the hospital and told to drink warm prune juice. Otherwise she has not used any other stimulant.  Past Medical History:  Diagnosis Date  . Acid reflux 12/30/2006  . Adult hypothyroidism 01/13/2007  . Allergic rhinitis 09/07/2014  . Anxiety 12/10/2014  . Bronchitis 05/17/2015  . Cancer of cheek ([place]) 07/04/2007  . Depression, neurotic 12/30/2006  . Prediabetes 08/11/2016  . RAD (reactive airway disease) 09/07/2014  . Small bowel obstruction ([place]) 01/31/2017    Past Surgical History:  Procedure Laterality Date  . ABDOMINAL HYSTERECTOMY  1988   heavy, irregular menses; ovaries intact  . BREAST SURGERY  1970   augmentation  . EYE SURGERY     cataract extraction  . EYE SURGERY  2007   LASIK  . TONSILLECTOMY AND ADENOIDECTOMY  1950    Family History  Problem Relation Age of Onset  . Stroke Mother   . CAD Mother   . Heart attack Father   . Hyperthyroidism Sister   . Heart Problems Brother   . Cancer Brother        skin  . CAD Brother        CABG    Social History:  reports that she quit smoking about 13 years ago. She has a 20.00 pack-year smoking history. She has never used smokeless tobacco. She reports that she does not drink alcohol or use drugs.  Allergies: No Known  Allergies  Medications reviewed.    ROS A multipoint review of systems was completed, all pertinent positives and negatives are documented within the history of present illness and the remainder are negative   BP 119/72   Pulse 80   Temp 98.1 F (36.7 C) (Oral)   Ht 5\' 1"  (1.549 m)   Wt 49.9 kg (110 lb)   BMI 20.78 kg/m   Physical Exam Gen.: No acute distress Chest: Clear to auscultation  heart: Regular rate and rhythm Abdomen: Soft, mildly tender to deep palpation, minimally distended. It is not tympanic and there are normal bowel sounds.    No results found for this or any previous visit (from the past 48 hour(s)). Dg Abd 2 Views  Result Date: 02/12/2017 CLINICAL DATA:  Recent small-bowel obstruction, persistent abdominal pain, nausea and constipation EXAM: ABDOMEN - 2 VIEW COMPARISON:  02/03/2017 FINDINGS: Scattered air and stool throughout the bowel. No significant obstruction pattern or ileus. Hyperdense material throughout the colon. Aortoiliac atherosclerosis noted. No acute osseous finding. Lung bases are clear. Peripherally calcified breast implants again evident. IMPRESSION: Negative for bowel obstruction. Electronically Signed   By: Jerilynn Mages.  Shick M.D.   On: 02/12/2017 08:17    Assessment/Plan:  1. Hospital discharge follow-up 71 year old female here for hospital follow-up from a recent bowel obstruction. Current symptoms appear to be more be from constipation. Patient had  a recent x-ray which was negative for bowel obstruction. Discussed remedies for constipation. We will start with a suppository and add MiraLAX to her daily routine. Discussed that she doesn't have a bowel movement today that we would add magnesium citrate. Discussed at any point should she develop nausea, vomiting, unrelenting abdominal pain that she is to report to the hospital for further care. Otherwise she will call on Monday if she does not have success with initiating bowel function.  A total of 15  minutes was used on this encounter with greater than 50% of the encounter used for counseling or coordination of care    Clayburn Pert, MD [place]  02/12/2017,9:40 AM

## 2017-02-15 ENCOUNTER — Telehealth: Payer: Self-pay

## 2017-02-15 NOTE — Telephone Encounter (Signed)
Patient called letting us know that she has had a bowel movement based on instructions given to her on Friday pre Dr. Adonis Huguenin. Patient states that she is feeling much better.

## 2017-03-15 ENCOUNTER — Other Ambulatory Visit: Payer: Self-pay | Admitting: Physician Assistant

## 2017-03-15 DIAGNOSIS — E039 Hypothyroidism, unspecified: Secondary | ICD-10-CM

## 2017-04-21 ENCOUNTER — Other Ambulatory Visit: Payer: Self-pay | Admitting: Physician Assistant

## 2017-04-21 DIAGNOSIS — E7800 Pure hypercholesterolemia, unspecified: Secondary | ICD-10-CM

## 2017-04-21 DIAGNOSIS — F329 Major depressive disorder, single episode, unspecified: Secondary | ICD-10-CM

## 2017-04-21 DIAGNOSIS — F32A Depression, unspecified: Secondary | ICD-10-CM

## 2017-05-01 DIAGNOSIS — R69 Illness, unspecified: Secondary | ICD-10-CM | POA: Diagnosis not present

## 2017-06-08 DIAGNOSIS — R69 Illness, unspecified: Secondary | ICD-10-CM | POA: Diagnosis not present

## 2017-06-13 ENCOUNTER — Other Ambulatory Visit: Payer: Self-pay | Admitting: Physician Assistant

## 2017-06-13 DIAGNOSIS — E039 Hypothyroidism, unspecified: Secondary | ICD-10-CM

## 2017-06-14 NOTE — Telephone Encounter (Signed)
Should schedule AWV and follow up before more refills.

## 2017-07-22 ENCOUNTER — Telehealth: Payer: Self-pay | Admitting: Physician Assistant

## 2017-07-22 DIAGNOSIS — T753XXA Motion sickness, initial encounter: Secondary | ICD-10-CM

## 2017-07-22 MED ORDER — SCOPOLAMINE 1 MG/3DAYS TD PT72: 1.0000 | MEDICATED_PATCH | TRANSDERMAL | 0 refills | Status: DC

## 2017-07-22 NOTE — Telephone Encounter (Signed)
Scopolamine patches sent in

## 2017-07-22 NOTE — Telephone Encounter (Signed)
Pt is requesting an Rx for nausea patches be sent to [facility] because she is going on a cruise. Pt stated that she has tried OTC medication and it hasn't helped. Pt was advised Fabio Bering is out of the office and pt requested message to be sent to another provider. Please advise. Thanks TNP

## 2017-07-22 NOTE — Telephone Encounter (Signed)
Please Review

## 2017-08-03 DIAGNOSIS — J111 Influenza due to unidentified influenza virus with other respiratory manifestations: Secondary | ICD-10-CM | POA: Diagnosis not present

## 2017-08-03 DIAGNOSIS — J209 Acute bronchitis, unspecified: Secondary | ICD-10-CM | POA: Diagnosis not present

## 2017-08-30 ENCOUNTER — Other Ambulatory Visit: Payer: Self-pay | Admitting: Physician Assistant

## 2017-08-30 DIAGNOSIS — F32A Depression, unspecified: Secondary | ICD-10-CM

## 2017-08-30 DIAGNOSIS — F329 Major depressive disorder, single episode, unspecified: Secondary | ICD-10-CM

## 2017-08-31 NOTE — Telephone Encounter (Signed)
Please review. KW 

## 2017-08-31 NOTE — Telephone Encounter (Signed)
Pharmacy requesting refills. Thanks!  

## 2017-09-01 NOTE — Telephone Encounter (Signed)
Patient advised. Follow up scheduled for 09/09/17.

## 2017-09-01 NOTE — Telephone Encounter (Signed)
Needs OV before more refills, hasn't been seen in a year. Will also need TSH to monitor thyroid.

## 2017-09-09 ENCOUNTER — Ambulatory Visit (INDEPENDENT_AMBULATORY_CARE_PROVIDER_SITE_OTHER): Payer: Medicare HMO | Admitting: Physician Assistant

## 2017-09-09 ENCOUNTER — Encounter: Payer: Self-pay | Admitting: Physician Assistant

## 2017-09-09 VITALS — BP 118/62 | HR 64 | Temp 98.2°F | Resp 16 | Ht 62.5 in | Wt 115.0 lb

## 2017-09-09 DIAGNOSIS — E039 Hypothyroidism, unspecified: Secondary | ICD-10-CM | POA: Diagnosis not present

## 2017-09-09 DIAGNOSIS — R519 Headache, unspecified: Secondary | ICD-10-CM

## 2017-09-09 DIAGNOSIS — E7800 Pure hypercholesterolemia, unspecified: Secondary | ICD-10-CM

## 2017-09-09 DIAGNOSIS — R7303 Prediabetes: Secondary | ICD-10-CM

## 2017-09-09 DIAGNOSIS — R51 Headache: Secondary | ICD-10-CM | POA: Diagnosis not present

## 2017-09-09 NOTE — Patient Instructions (Signed)
Hypothyroidism Hypothyroidism is a disorder of the thyroid. The thyroid is a large gland that is located in the lower front of the neck. The thyroid releases hormones that control how the body works. With hypothyroidism, the thyroid does not make enough of these hormones. What are the causes? Causes of hypothyroidism may include:  Viral infections.  Pregnancy.  Your own defense system (immune system) attacking your thyroid.  Certain medicines.  Birth defects.  Past radiation treatments to your head or neck.  Past treatment with radioactive iodine.  Past surgical removal of part or all of your thyroid.  Problems with the gland that is located in the center of your brain (pituitary).  What are the signs or symptoms? Signs and symptoms of hypothyroidism may include:  Feeling as though you have no energy (lethargy).  Inability to tolerate cold.  Weight gain that is not explained by a change in diet or exercise habits.  Dry skin.  Coarse hair.  Menstrual irregularity.  Slowing of thought processes.  Constipation.  Sadness or depression.  How is this diagnosed? Your health care provider may diagnose hypothyroidism with blood tests and ultrasound tests. How is this treated? Hypothyroidism is treated with medicine that replaces the hormones that your body does not make. After you begin treatment, it may take several weeks for symptoms to go away. Follow these instructions at home:  Take medicines only as directed by your health care provider.  If you start taking any new medicines, tell your health care provider.  Keep all follow-up visits as directed by your health care provider. This is important. As your condition improves, your dosage needs may change. You will need to have blood tests regularly so that your health care provider can watch your condition. Contact a health care provider if:  Your symptoms do not get better with treatment.  You are taking thyroid  replacement medicine and: ? You sweat excessively. ? You have tremors. ? You feel anxious. ? You lose weight rapidly. ? You cannot tolerate heat. ? You have emotional swings. ? You have diarrhea. ? You feel weak. Get help right away if:  You develop chest pain.  You develop an irregular heartbeat.  You develop a rapid heartbeat. This information is not intended to replace advice given to you by your health care provider. Make sure you discuss any questions you have with your health care provider. Document Released: 04/27/2005 Document Revised: 10/03/2015 Document Reviewed: 09/12/2013 Elsevier Interactive Patient Education  2018 Elsevier Inc.  

## 2017-09-09 NOTE — Progress Notes (Signed)
Patient: Hailey Cain Female    DOB: 18-Aug-1945   72 y.o.   MRN: [id number] Visit Date: 09/09/2017  Today's Provider: Trinna Post, PA-C   Chief Complaint  Patient presents with  . Hyperlipidemia  . Hypothyroidism  . Depression   Subjective:    HPI  Lipid/Cholesterol, Follow-up:   Last seen for this1 years ago.  Management changes since that visit include no changes. . Last Lipid Panel:    Component Value Date/Time   CHOL 202 (H) 08/11/2016 1044   TRIG 82 08/11/2016 1044   HDL 82 08/11/2016 1044   CHOLHDL 2.5 08/11/2016 1044   LDLCALC 104 (H) 08/11/2016 1044    Risk factors for vascular disease include former smoker  She reports good compliance with treatment. She is not having side effects.  Current symptoms include none and have been stable. Weight trend: stable Prior visit with dietician: no Current diet: well balanced Current exercise: no regular exercise  Wt Readings from Last 3 Encounters:  09/09/17 115 lb (52.2 kg)  02/12/17 110 lb (49.9 kg)  02/11/17 109 lb (49.4 kg)   Hypothyroidism, follow up: Patient was last seen 1 yr ago. No changes were made in her medications. She is currently taking levothyroxine 17mcg daily. She reports that over the last few months, she has had a decrease in energy level. She reports that she averages between 7-8 hours of sleep at night.  Lab Results  Component Value Date   TSH 2.760 08/11/2016   Depression, follow up: Patient was last seen 1 year ago. No changes were made in her medications. She is currently taking fluoxetine 20mg  daily . She feels that her current dose is controlling her symptoms.    Depression screen [facility] 2/9 09/09/2017 08/11/2016 08/11/2016  Decreased Interest 0 0 0  Down, Depressed, Hopeless 0 0 0  PHQ - 2 Score 0 0 0  Altered sleeping 3 0 -  Tired, decreased energy 3 1 -  Change in appetite 3 0 -  Feeling bad or failure about yourself  1 1 -  Trouble concentrating 0 1 -  Moving slowly or  fidgety/restless 0 0 -  Suicidal thoughts 0 0 -  PHQ-9 Score 10 3 -  Difficult doing work/chores Not difficult at all - -   Also says she has been having new headaches on the side of her right head. She says she has had headaches her entire life but that these feel different. Denies vision change, weakness, N/V. It is not the worst headache of her life.      No Known Allergies     Current Outpatient Medications:  .  aspirin 81 MG tablet, Take 81 mg by mouth daily. , Disp: , Rfl:  .  cholecalciferol (VITAMIN D) 400 UNITS TABS tablet, Take 400 Units by mouth daily. , Disp: , Rfl:  .  cyanocobalamin 100 MCG tablet, Take 100 mcg by mouth daily. , Disp: , Rfl:  .  FLUoxetine (PROZAC) 20 MG capsule, TAKE 3 CAPSULES BY MOUTH ONCE DAILY, Disp: 90 capsule, Rfl: 0 .  levothyroxine (SYNTHROID, LEVOTHROID) 75 MCG tablet, TAKE 1 TABLET BY MOUTH ONCE DAILY, Disp: 90 tablet, Rfl: 0 .  loratadine (CLARITIN) 10 MG tablet, Take 1 tablet (10 mg total) by mouth daily. Reported on 05/17/2015, Disp: 30 tablet, Rfl: 5 .  montelukast (SINGULAIR) 10 MG tablet, Take 1 tablet (10 mg total) by mouth at bedtime. Reported on 05/17/2015, Disp: 30 tablet, Rfl: 5 .  Multiple Vitamins-Minerals (EMERGEN-C IMMUNE PO), Take 1 tablet by mouth daily. , Disp: , Rfl:  .  ranitidine (ZANTAC) 150 MG tablet, Take 150 mg by mouth daily as needed for heartburn., Disp: , Rfl:  .  scopolamine (TRANSDERM-SCOP) 1 MG/3DAYS, Place 1 patch (1.5 mg total) onto the skin every 3 (three) days., Disp: 4 patch, Rfl: 0 .  simvastatin (ZOCOR) 40 MG tablet, TAKE 1 TABLET BY MOUTH ONCE DAILY, Disp: 90 tablet, Rfl: 1 .  FLUZONE HIGH-DOSE 0.5 ML injection, ADM 0.5ML IM UTD, Disp: , Rfl: 0  Review of Systems  Constitutional: Positive for activity change and fatigue. Negative for appetite change, chills, diaphoresis, fever and unexpected weight change.  Cardiovascular: Negative for chest pain, palpitations and leg swelling.  Endocrine: Negative for cold  intolerance, heat intolerance, polydipsia, polyphagia and polyuria.  Musculoskeletal: Negative for arthralgias, back pain, gait problem, joint swelling and myalgias.  Allergic/Immunologic: Positive for environmental allergies.  Neurological: Positive for weakness. Negative for dizziness, tremors, syncope, numbness and headaches.  Psychiatric/Behavioral: Positive for decreased concentration. Negative for agitation, behavioral problems, confusion, self-injury, sleep disturbance and suicidal ideas. The patient is not nervous/anxious and is not hyperactive.     Social History   Tobacco Use  . Smoking status: Former Smoker    Packs/day: 1.00    Years: 20.00    Pack years: 20.00    Last attempt to quit: 05/12/2003    Years since quitting: 14.3  . Smokeless tobacco: Never Used  Substance Use Topics  . Alcohol use: No    Alcohol/week: 0.0 oz   Objective:   BP 118/62 (BP Location: Left Arm, Patient Position: Sitting, Cuff Size: Normal)   Pulse 64   Temp 98.2 F (36.8 C)   Resp 16   Ht 5' 2.5" (1.588 m)   Wt 115 lb (52.2 kg)   SpO2 97%   BMI 20.70 kg/m  Vitals:   09/09/17 0859  BP: 118/62  Pulse: 64  Resp: 16  Temp: 98.2 F (36.8 C)  SpO2: 97%  Weight: 115 lb (52.2 kg)  Height: 5' 2.5" (1.588 m)     Physical Exam  Constitutional: She is oriented to person, place, and time. She appears well-developed and well-nourished.  Neck: Neck supple.  Cardiovascular: Normal rate and regular rhythm.  Pulmonary/Chest: Effort normal and breath sounds normal.  Lymphadenopathy:    She has no cervical adenopathy.  Neurological: She is alert and oriented to person, place, and time.  Skin: Skin is warm and dry.  Psychiatric: She has a normal mood and affect. Her behavior is normal.        Assessment & Plan:     1. Adult hypothyroidism  Continue medications.  - TSH  2. Prediabetes  - HgB A1c  3. Hypercholesterolemia without hypertriglyceridemia  Continue medications.   -  Lipid Profile  4. Nonintractable headache, unspecified chronicity pattern, unspecified headache type  Offered pt CT Head, which is indicated due to change in quality of her headaches and age. She declines, wants to observe.   Return in about 6 months (around 03/12/2018) for chronic .  The entirety of the information documented in the History of Present Illness, Review of Systems and Physical Exam were personally obtained by me. Portions of this information were initially documented by Wilburt Finlay, CMA and reviewed by me for thoroughness and accuracy.             Trinna Post, PA-C  [facility]

## 2017-09-10 LAB — LIPID PANEL
Chol/HDL Ratio: 2.7 ratio (ref 0.0–4.4)
Cholesterol, Total: 196 mg/dL (ref 100–199)
HDL: 73 mg/dL (ref >39)
LDL Calculated: 108 mg/dL — ABNORMAL HIGH (ref 0–99)
Triglycerides: 76 mg/dL (ref 0–149)
VLDL Cholesterol Cal: 15 mg/dL (ref 5–40)

## 2017-09-10 LAB — HEMOGLOBIN A1C
Est. average glucose Bld gHb Est-mCnc: 120 mg/dL
Hgb A1c MFr Bld: 5.8 % — ABNORMAL HIGH (ref 4.8–5.6)

## 2017-09-10 LAB — TSH: TSH: 4.24 u[IU]/mL (ref 0.450–4.500)

## 2017-09-13 ENCOUNTER — Other Ambulatory Visit: Payer: Self-pay | Admitting: Physician Assistant

## 2017-09-13 DIAGNOSIS — E039 Hypothyroidism, unspecified: Secondary | ICD-10-CM

## 2017-09-13 MED ORDER — LEVOTHYROXINE SODIUM 75 MCG PO TABS: 75.0000 ug | ORAL_TABLET | Freq: Every day | ORAL | 0 refills | Status: DC

## 2017-09-13 NOTE — Telephone Encounter (Signed)
Needs a refill on her thyroid med.  Levothyroxine   She uses [facility]  [employer]

## 2017-09-28 ENCOUNTER — Encounter: Payer: Self-pay | Admitting: Physician Assistant

## 2017-09-28 ENCOUNTER — Ambulatory Visit (INDEPENDENT_AMBULATORY_CARE_PROVIDER_SITE_OTHER): Payer: Medicare HMO | Admitting: Physician Assistant

## 2017-09-28 ENCOUNTER — Ambulatory Visit (INDEPENDENT_AMBULATORY_CARE_PROVIDER_SITE_OTHER): Payer: Medicare HMO

## 2017-09-28 VITALS — BP 124/56 | HR 70 | Temp 98.9°F | Ht 63.0 in | Wt 117.6 lb

## 2017-09-28 VITALS — BP 124/56 | HR 70 | Temp 98.9°F | Ht 63.0 in | Wt 117.0 lb

## 2017-09-28 DIAGNOSIS — Z780 Asymptomatic menopausal state: Secondary | ICD-10-CM

## 2017-09-28 DIAGNOSIS — Z0000 Encounter for general adult medical examination without abnormal findings: Secondary | ICD-10-CM | POA: Diagnosis not present

## 2017-09-28 DIAGNOSIS — J309 Allergic rhinitis, unspecified: Secondary | ICD-10-CM | POA: Diagnosis not present

## 2017-09-28 MED ORDER — LORATADINE 10 MG PO TABS: 10.0000 mg | ORAL_TABLET | Freq: Every day | ORAL | 1 refills | Status: DC

## 2017-09-28 MED ORDER — MONTELUKAST SODIUM 10 MG PO TABS: 10.0000 mg | ORAL_TABLET | Freq: Every day | ORAL | 1 refills | Status: DC

## 2017-09-28 NOTE — Patient Instructions (Signed)

## 2017-09-28 NOTE — Patient Instructions (Addendum)
Hailey Cain , Thank you for taking time to come for your Medicare Wellness Visit. I appreciate your ongoing commitment to your health goals. Please review the following plan we discussed and let me know if I can assist you in the future.   Screening recommendations/referrals: Colonoscopy: Up to date Mammogram: Up to date Bone Density: Up to date Recommended yearly ophthalmology/optometry visit for glaucoma screening and checkup Recommended yearly dental visit for hygiene and checkup  Vaccinations: Influenza vaccine: N/A Pneumococcal vaccine: Up to date Tdap vaccine: Up to date Shingles vaccine: Pt declines today.     Advanced directives: Please bring a copy of your POA (Power of Attorney) and/or Living Will to your next appointment.   Conditions/risks identified: Recommend to start back exercising 3 days a week for 30 minutes at a time. (Pt to start a silver sneakers class)  Next appointment: 3:00 PM today with Carles Collet.   Preventive Care 16 Years and Older, Female Preventive care refers to lifestyle choices and visits with your health care provider that can promote health and wellness. What does preventive care include?  A yearly physical exam. This is also called an annual well check.  Dental exams once or twice a year.  Routine eye exams. Ask your health care provider how often you should have your eyes checked.  Personal lifestyle choices, including:  Daily care of your teeth and gums.  Regular physical activity.  Eating a healthy diet.  Avoiding tobacco and drug use.  Limiting alcohol use.  Practicing safe sex.  Taking low-dose aspirin every day.  Taking vitamin and mineral supplements as recommended by your health care provider. What happens during an annual well check? The services and screenings done by your health care provider during your annual well check will depend on your age, overall health, lifestyle risk factors, and family history of  disease. Counseling  Your health care provider may ask you questions about your:  Alcohol use.  Tobacco use.  Drug use.  Emotional well-being.  Home and relationship well-being.  Sexual activity.  Eating habits.  History of falls.  Memory and ability to understand (cognition).  Work and work Statistician.  Reproductive health. Screening  You may have the following tests or measurements:  Height, weight, and BMI.  Blood pressure.  Lipid and cholesterol levels. These may be checked every 5 years, or more frequently if you are over 47 years old.  Skin check.  Lung cancer screening. You may have this screening every year starting at age 9 if you have a 30-pack-year history of smoking and currently smoke or have quit within the past 15 years.  Fecal occult blood test (FOBT) of the stool. You may have this test every year starting at age 8.  Flexible sigmoidoscopy or colonoscopy. You may have a sigmoidoscopy every 5 years or a colonoscopy every 10 years starting at age 6.  Hepatitis C blood test.  Hepatitis B blood test.  Sexually transmitted disease (STD) testing.  Diabetes screening. This is done by checking your blood sugar (glucose) after you have not eaten for a while (fasting). You may have this done every 1-3 years.  Bone density scan. This is done to screen for osteoporosis. You may have this done starting at age 76.  Mammogram. This may be done every 1-2 years. Talk to your health care provider about how often you should have regular mammograms. Talk with your health care provider about your test results, treatment options, and if necessary, the need for more  tests. Vaccines  Your health care provider may recommend certain vaccines, such as:  Influenza vaccine. This is recommended every year.  Tetanus, diphtheria, and acellular pertussis (Tdap, Td) vaccine. You may need a Td booster every 10 years.  Zoster vaccine. You may need this after age  53.  Pneumococcal 13-valent conjugate (PCV13) vaccine. One dose is recommended after age 67.  Pneumococcal polysaccharide (PPSV23) vaccine. One dose is recommended after age 11. Talk to your health care provider about which screenings and vaccines you need and how often you need them. This information is not intended to replace advice given to you by your health care provider. Make sure you discuss any questions you have with your health care provider. Document Released: 05/24/2015 Document Revised: 01/15/2016 Document Reviewed: 02/26/2015 Elsevier Interactive Patient Education  2017 [place] Prevention in the Home Falls can cause injuries. They can happen to people of all ages. There are many things you can do to make your home safe and to help prevent falls. What can I do on the outside of my home?  Regularly fix the edges of walkways and driveways and fix any cracks.  Remove anything that might make you trip as you walk through a door, such as a raised step or threshold.  Trim any bushes or trees on the path to your home.  Use bright outdoor lighting.  Clear any walking paths of anything that might make someone trip, such as rocks or tools.  Regularly check to see if handrails are loose or broken. Make sure that both sides of any steps have handrails.  Any raised decks and porches should have guardrails on the edges.  Have any leaves, snow, or ice cleared regularly.  Use sand or salt on walking paths during winter.  Clean up any spills in your garage right away. This includes oil or grease spills. What can I do in the bathroom?  Use night lights.  Install grab bars by the toilet and in the tub and shower. Do not use towel bars as grab bars.  Use non-skid mats or decals in the tub or shower.  If you need to sit down in the shower, use a plastic, non-slip stool.  Keep the floor dry. Clean up any water that spills on the floor as soon as it happens.  Remove  soap buildup in the tub or shower regularly.  Attach bath mats securely with double-sided non-slip rug tape.  Do not have throw rugs and other things on the floor that can make you trip. What can I do in the bedroom?  Use night lights.  Make sure that you have a light by your bed that is easy to reach.  Do not use any sheets or blankets that are too big for your bed. They should not hang down onto the floor.  Have a firm chair that has side arms. You can use this for support while you get dressed.  Do not have throw rugs and other things on the floor that can make you trip. What can I do in the kitchen?  Clean up any spills right away.  Avoid walking on wet floors.  Keep items that you use a lot in easy-to-reach places.  If you need to reach something above you, use a strong step stool that has a grab bar.  Keep electrical cords out of the way.  Do not use floor polish or wax that makes floors slippery. If you must use wax, use non-skid floor  wax.  Do not have throw rugs and other things on the floor that can make you trip. What can I do with my stairs?  Do not leave any items on the stairs.  Make sure that there are handrails on both sides of the stairs and use them. Fix handrails that are broken or loose. Make sure that handrails are as long as the stairways.  Check any carpeting to make sure that it is firmly attached to the stairs. Fix any carpet that is loose or worn.  Avoid having throw rugs at the top or bottom of the stairs. If you do have throw rugs, attach them to the floor with carpet tape.  Make sure that you have a light switch at the top of the stairs and the bottom of the stairs. If you do not have them, ask someone to add them for you. What else can I do to help prevent falls?  Wear shoes that:  Do not have high heels.  Have rubber bottoms.  Are comfortable and fit you well.  Are closed at the toe. Do not wear sandals.  If you use a  stepladder:  Make sure that it is fully opened. Do not climb a closed stepladder.  Make sure that both sides of the stepladder are locked into place.  Ask someone to hold it for you, if possible.  Clearly mark and make sure that you can see:  Any grab bars or handrails.  First and last steps.  Where the edge of each step is.  Use tools that help you move around (mobility aids) if they are needed. These include:  Canes.  Walkers.  Scooters.  Crutches.  Turn on the lights when you go into a dark area. Replace any light bulbs as soon as they burn out.  Set up your furniture so you have a clear path. Avoid moving your furniture around.  If any of your floors are uneven, fix them.  If there are any pets around you, be aware of where they are.  Review your medicines with your doctor. Some medicines can make you feel dizzy. This can increase your chance of falling. Ask your doctor what other things that you can do to help prevent falls. This information is not intended to replace advice given to you by your health care provider. Make sure you discuss any questions you have with your health care provider. Document Released: 02/21/2009 Document Revised: 10/03/2015 Document Reviewed: 06/01/2014 Elsevier Interactive Patient Education  2017 [employer].

## 2017-09-28 NOTE — Progress Notes (Signed)
Patient: Hailey Cain, Female    DOB: [birth year], 72 y.o.   MRN: [id number] Visit Date: 09/29/2017  Today's Provider: Trinna Post, PA-C   Chief Complaint  Patient presents with  . Medicare Wellness   Subjective:     Complete Physical ASHAWNTI Cain is a 72 y.o. female. She feels well. She reports exercising regularly. She reports she is sleeping fairly well.  Depression and Anxiety  She is currently on Prozac 40 mg daily and is doing well with this.  Allergies  Needs refills on Claritin and Singulair.  HLD  Cholesterol well controlled on Zocor 40 mg on 09/2017 labs.  Thyroid  Takes 75 mcg synthroid for hypothyroidism, normal on 09/2017 labs.  Mammograms: Declined DEXA: ordered, was not gotten last year despite being ordered, pt reports never being contacted Colonoscopy: Completed 2014, normal and repeat 10 years Shingrix: Pt will check with insurance to see where it pays. She has had Zostavax in the past. -----------------------------------------------------------   Review of Systems  Constitutional: Positive for fatigue. Negative for activity change, appetite change, chills, diaphoresis, fever and unexpected weight change.  HENT: Positive for sneezing and voice change.   Eyes: Negative.   Respiratory: Negative.   Cardiovascular: Negative.   Gastrointestinal: Positive for constipation.  Endocrine: Negative.   Genitourinary: Negative.   Musculoskeletal: Negative.   Skin: Negative.   Allergic/Immunologic: Negative.   Neurological: Positive for headaches.  Hematological: Negative.   Psychiatric/Behavioral: Negative.     Social History   Socioeconomic History  . Marital status: Widowed    Spouse name: Hailey Cain  . Number of children: 1  . Years of education: H/S  . Highest education level: 12th grade  Occupational History  . Occupation: retired  Scientific laboratory technician  . Financial resource strain: Not hard at all  . Food insecurity:    Worry:  Never true    Inability: Never true  . Transportation needs:    Medical: No    Non-medical: No  Tobacco Use  . Smoking status: Former Smoker    Packs/day: 1.00    Years: 20.00    Pack years: 20.00    Last attempt to quit: 05/12/2003    Years since quitting: 14.3  . Smokeless tobacco: Never Used  Substance and Sexual Activity  . Alcohol use: No    Alcohol/week: 0.0 oz  . Drug use: No  . Sexual activity: Not on file  Lifestyle  . Physical activity:    Days per week: Not on file    Minutes per session: Not on file  . Stress: Not at all  Relationships  . Social connections:    Talks on phone: Not on file    Gets together: Not on file    Attends religious service: Not on file    Active member of club or organization: Not on file    Attends meetings of clubs or organizations: Not on file    Relationship status: Not on file  . Intimate partner violence:    Fear of current or ex partner: Not on file    Emotionally abused: Not on file    Physically abused: Not on file    Forced sexual activity: Not on file  Other Topics Concern  . Not on file  Social History Narrative  . Not on file    Past Medical History:  Diagnosis Date  . Acid reflux 12/30/2006  . Adult hypothyroidism 01/13/2007  . Allergic rhinitis 09/07/2014  . Anxiety  12/10/2014  . Bronchitis 05/17/2015  . Cancer of cheek ([place]) 07/04/2007  . Depression, neurotic 12/30/2006  . Hyperlipidemia   . Prediabetes 08/11/2016  . RAD (reactive airway disease) 09/07/2014  . Small bowel obstruction ([place]) 01/31/2017     Patient Active Problem List   Diagnosis Date Noted  . Small bowel obstruction ([place]) 01/31/2017  . Prediabetes 08/11/2016  . Bronchitis 05/17/2015  . Anxiety 12/10/2014  . Osteopenia 11/06/2014  . Allergic rhinitis 09/07/2014  . Blood glucose elevated 09/07/2014  . Lacunar infarction ([place]) 09/07/2014  . RAD (reactive airway disease) 09/07/2014  . Cancer of cheek ([place]) 07/04/2007  . Adult hypothyroidism 01/13/2007    . Narrowing of intervertebral disc space 12/30/2006  . Depression, neurotic 12/30/2006  . Acid reflux 12/30/2006  . Fam hx-ischem heart disease 12/30/2006  . History of tobacco use 12/30/2006  . Hypercholesterolemia without hypertriglyceridemia 12/30/2006    Past Surgical History:  Procedure Laterality Date  . ABDOMINAL HYSTERECTOMY  1988   heavy, irregular menses; ovaries intact  . BREAST SURGERY  1970   augmentation  . EYE SURGERY     cataract extraction  . EYE SURGERY  2007   LASIK  . TONSILLECTOMY AND ADENOIDECTOMY  1950    Her family history includes CAD in her brother and mother; Cancer in her brother; Heart Problems in her brother; Heart attack in her father; Hyperthyroidism in her sister; Stroke in her mother.      Current Outpatient Medications:  .  aspirin 81 MG tablet, Take 81 mg by mouth daily. , Disp: , Rfl:  .  cholecalciferol (VITAMIN D) 400 UNITS TABS tablet, Take 400 Units by mouth daily. , Disp: , Rfl:  .  cyanocobalamin 100 MCG tablet, Take 100 mcg by mouth daily. , Disp: , Rfl:  .  FLUoxetine (PROZAC) 20 MG capsule, TAKE 3 CAPSULES BY MOUTH ONCE DAILY (Patient taking differently: TAKE 2 CAPSULES BY MOUTH ONCE DAILY), Disp: 90 capsule, Rfl: 0 .  FLUZONE HIGH-DOSE 0.5 ML injection, ADM 0.5ML IM UTD, Disp: , Rfl: 0 .  levothyroxine (SYNTHROID, LEVOTHROID) 75 MCG tablet, Take 1 tablet (75 mcg total) by mouth daily., Disp: 90 tablet, Rfl: 0 .  loratadine (CLARITIN) 10 MG tablet, Take 1 tablet (10 mg total) by mouth daily. Reported on 05/17/2015, Disp: 90 tablet, Rfl: 1 .  montelukast (SINGULAIR) 10 MG tablet, Take 1 tablet (10 mg total) by mouth at bedtime. Reported on 05/17/2015, Disp: 90 tablet, Rfl: 1 .  Multiple Vitamins-Minerals (EMERGEN-C IMMUNE PO), Take 1 tablet by mouth daily. , Disp: , Rfl:  .  ranitidine (ZANTAC) 150 MG tablet, Take 150 mg by mouth daily as needed for heartburn., Disp: , Rfl:  .  scopolamine (TRANSDERM-SCOP) 1 MG/3DAYS, Place 1 patch (1.5 mg  total) onto the skin every 3 (three) days., Disp: 4 patch, Rfl: 0 .  simvastatin (ZOCOR) 40 MG tablet, TAKE 1 TABLET BY MOUTH ONCE DAILY, Disp: 90 tablet, Rfl: 1  Patient Care Team: Paulene Floor as PCP - General (Physician Assistant)     Objective:   Vitals: BP (!) 124/56   Pulse 70   Temp 98.9 F (37.2 C) (Oral)   Ht 5\' 3"  (1.6 m)   Wt 117 lb (53.1 kg)   BMI 20.73 kg/m   Physical Exam  Constitutional: She is oriented to person, place, and time. She appears well-developed and well-nourished.  Cardiovascular: Normal rate and regular rhythm.  Pulmonary/Chest: Effort normal and breath sounds normal.  Abdominal: Soft. Bowel sounds  are normal.  Neurological: She is alert and oriented to person, place, and time.  Skin: Skin is warm and dry.  Psychiatric: She has a normal mood and affect. Her behavior is normal.    Activities of Daily Living In your present state of health, do you have any difficulty performing the following activities: 09/28/2017 09/09/2017  Hearing? Y N  Comment Wears bilateral hearing aids.  -  Vision? Y Y  Comment Has a hard time focusing- has not seen the eye doctor yet.  -  Difficulty concentrating or making decisions? Tempie Donning  Walking or climbing stairs? N N  Dressing or bathing? N N  Doing errands, shopping? N N  Preparing Food and eating ? N -  Using the Toilet? N -  In the past six months, have you accidently leaked urine? N -  Do you have problems with loss of bowel control? N -  Managing your Medications? N -  Managing your Finances? N -  Housekeeping or managing your Housekeeping? N -  Some recent data might be hidden    Fall Risk Assessment Fall Risk  09/28/2017 09/09/2017 08/11/2016 07/22/2015 11/06/2014  Falls in the past year? Yes No Yes Yes No  Number falls in past yr: 1 - 1 1 -  Comment - - tripped  - -  Injury with Fall? No - No No -  Follow up Falls prevention discussed - Falls prevention discussed - -     Depression Screen PHQ 2/9  Scores 09/28/2017 09/09/2017 08/11/2016 08/11/2016  PHQ - 2 Score 2 0 0 0  PHQ- 9 Score 7 10 3  -     Assessment & Plan:    Annual Physical Reviewed patient's Family Medical History Reviewed and updated list of patient's medical providers Assessment of cognitive impairment was done at [facility], see Mackenzie's note.  Assessed patient's functional ability Established a written schedule for health screening [place] Completed and Reviewed  Exercise Activities and Dietary recommendations Goals    . Exercise 3x per week (30 min per time)     Recommend to start back exercising 3 days a week for 30 minutes at a time. (Pt to start a silver sneakers class)       Immunization History  Administered Date(s) Administered  . Influenza, High Dose Seasonal PF 02/13/2015, 02/08/2017  . Pneumococcal Conjugate-13 02/13/2015  . Pneumococcal Polysaccharide-23 05/01/2017  . Tdap 10/16/2010  . Zoster 10/27/2014    Health Maintenance  Topic Date Due  . MAMMOGRAM  09/10/2018 (Originally 10/30/2012)  . INFLUENZA VACCINE  12/09/2017  . TETANUS/TDAP  10/15/2020  . COLONOSCOPY  05/27/2022  . DEXA SCAN  Completed  . Hepatitis C Screening  Completed  . PNA vac Low Risk Adult  Completed     Discussed health benefits of physical activity, and encouraged her to engage in regular exercise appropriate for her age and condition.    1. Annual physical exam  Declines mammograms, colonoscopy UTD, DXA ordered.   2. Postmenopausal  - DG Bone Density; Future  3. Allergic rhinitis  - loratadine (CLARITIN) 10 MG tablet; Take 1 tablet (10 mg total) by mouth daily. Reported on 05/17/2015  Dispense: 90 tablet; Refill: 1 - montelukast (SINGULAIR) 10 MG tablet; Take 1 tablet (10 mg total) by mouth at bedtime. Reported on 05/17/2015  Dispense: 90 tablet; Refill: 1  Return in about 1 year (around 09/29/2018) for [facility] and CPE.  The entirety of the information documented in the History of Present Illness,  Review of Systems and Physical Exam were personally obtained by me. Portions of this information were initially documented by Ashley Royalty, CMA and reviewed by me for thoroughness and accuracy.   ------------------------------------------------------------------------------------------------------------    Trinna Post, PA-C  [facility]

## 2017-09-28 NOTE — Progress Notes (Signed)
Subjective:   Hailey Cain is a 72 y.o. female who presents for Medicare Annual (Subsequent) preventive examination.  Review of Systems:  N/A  Cardiac Risk Factors include: advanced age (>41men, >68 women);dyslipidemia     Objective:     Vitals: BP (!) 124/56 (BP Location: Right Arm)   Pulse 70   Temp 98.9 F (37.2 C) (Oral)   Ht 5\' 3"  (1.6 m)   Wt 117 lb 9.6 oz (53.3 kg)   BMI 20.83 kg/m   Body mass index is 20.83 kg/m.  Advanced Directives 09/28/2017 01/31/2017 01/31/2017 08/11/2016 07/22/2015 05/17/2015 12/10/2014  Does Patient Have a Medical Advance Directive? Yes No No Yes No No No  Type of Advance Directive Healthcare Power of [place]  Does patient want to make changes to medical advance directive? - - - Yes (ED - Information included in AVS) - - -  Copy of [place] in Chart? No - copy requested - - No - copy requested - - -  Would patient like information on creating a medical advance directive? - No - Patient declined - - - - -    Tobacco Social History   Tobacco Use  Smoking Status Former Smoker  . Packs/day: 1.00  . Years: 20.00  . Pack years: 20.00  . Last attempt to quit: 05/12/2003  . Years since quitting: 14.3  Smokeless Tobacco Never Used     Counseling given: No   Clinical Intake:  Pre-visit preparation completed: Yes  Pain : No/denies pain Pain Score: 0-No pain     Nutritional Status: BMI of 19-24  Normal Nutritional Risks: None Diabetes: No  How often do you need to have someone help you when you read instructions, pamphlets, or other written materials from your doctor or pharmacy?: 1 - Never  Interpreter Needed?: No  Information entered by :: [facility], LPN  Past Medical History:  Diagnosis Date  . Acid reflux 12/30/2006  . Adult hypothyroidism 01/13/2007  . Allergic rhinitis 09/07/2014  . Anxiety 12/10/2014  . Bronchitis 05/17/2015  . Cancer of cheek ([place]) 07/04/2007  .  Depression, neurotic 12/30/2006  . Hyperlipidemia   . Prediabetes 08/11/2016  . RAD (reactive airway disease) 09/07/2014  . Small bowel obstruction ([place]) 01/31/2017   Past Surgical History:  Procedure Laterality Date  . ABDOMINAL HYSTERECTOMY  1988   heavy, irregular menses; ovaries intact  . BREAST SURGERY  1970   augmentation  . EYE SURGERY     cataract extraction  . EYE SURGERY  2007   LASIK  . TONSILLECTOMY AND ADENOIDECTOMY  1950   Family History  Problem Relation Age of Onset  . Stroke Mother   . CAD Mother   . Heart attack Father   . Hyperthyroidism Sister   . Heart Problems Brother   . Cancer Brother        skin  . CAD Brother        CABG   Social History   Socioeconomic History  . Marital status: Widowed    Spouse name: Laban Emperor  . Number of children: 1  . Years of education: H/S  . Highest education level: 12th grade  Occupational History  . Occupation: retired  Scientific laboratory technician  . Financial resource strain: Not hard at all  . Food insecurity:    Worry: Never true    Inability: Never true  . Transportation needs:    Medical: No    Non-medical:  No  Tobacco Use  . Smoking status: Former Smoker    Packs/day: 1.00    Years: 20.00    Pack years: 20.00    Last attempt to quit: 05/12/2003    Years since quitting: 14.3  . Smokeless tobacco: Never Used  Substance and Sexual Activity  . Alcohol use: No    Alcohol/week: 0.0 oz  . Drug use: No  . Sexual activity: Not on file  Lifestyle  . Physical activity:    Days per week: Not on file    Minutes per session: Not on file  . Stress: Not at all  Relationships  . Social connections:    Talks on phone: Not on file    Gets together: Not on file    Attends religious service: Not on file    Active member of club or organization: Not on file    Attends meetings of clubs or organizations: Not on file    Relationship status: Not on file  Other Topics Concern  . Not on file  Social History Narrative  . Not on  file    Outpatient Encounter Medications as of 09/28/2017  Medication Sig  . aspirin 81 MG tablet Take 81 mg by mouth daily.   . cholecalciferol (VITAMIN D) 400 UNITS TABS tablet Take 400 Units by mouth daily.   . cyanocobalamin 100 MCG tablet Take 100 mcg by mouth daily.   Marland Kitchen FLUoxetine (PROZAC) 20 MG capsule TAKE 3 CAPSULES BY MOUTH ONCE DAILY (Patient taking differently: TAKE 2 CAPSULES BY MOUTH ONCE DAILY)  . FLUZONE HIGH-DOSE 0.5 ML injection ADM 0.5ML IM UTD  . levothyroxine (SYNTHROID, LEVOTHROID) 75 MCG tablet Take 1 tablet (75 mcg total) by mouth daily.  . ranitidine (ZANTAC) 150 MG tablet Take 150 mg by mouth daily as needed for heartburn.  . simvastatin (ZOCOR) 40 MG tablet TAKE 1 TABLET BY MOUTH ONCE DAILY  . loratadine (CLARITIN) 10 MG tablet Take 1 tablet (10 mg total) by mouth daily. Reported on 05/17/2015 (Patient not taking: Reported on 09/28/2017)  . montelukast (SINGULAIR) 10 MG tablet Take 1 tablet (10 mg total) by mouth at bedtime. Reported on 05/17/2015 (Patient not taking: Reported on 09/28/2017)  . Multiple Vitamins-Minerals (EMERGEN-C IMMUNE PO) Take 1 tablet by mouth daily.   Marland Kitchen scopolamine (TRANSDERM-SCOP) 1 MG/3DAYS Place 1 patch (1.5 mg total) onto the skin every 3 (three) days. (Patient not taking: Reported on 09/28/2017)   No facility-administered encounter medications on file as of 09/28/2017.     Activities of Daily Living In your present state of health, do you have any difficulty performing the following activities: 09/28/2017 09/09/2017  Hearing? Y N  Comment Wears bilateral hearing aids.  -  Vision? Y Y  Comment Has a hard time focusing- has not seen the eye doctor yet.  -  Difficulty concentrating or making decisions? Tempie Donning  Walking or climbing stairs? N N  Dressing or bathing? N N  Doing errands, shopping? N N  Preparing Food and eating ? N -  Using the Toilet? N -  In the past six months, have you accidently leaked urine? N -  Do you have problems with loss  of bowel control? N -  Managing your Medications? N -  Managing your Finances? N -  Housekeeping or managing your Housekeeping? N -  Some recent data might be hidden    Patient Care Team: Paulene Floor as PCP - General (Physician Assistant)    Assessment:   This  is a routine wellness examination for [place].  Exercise Activities and Dietary recommendations Current Exercise Habits: The patient does not participate in regular exercise at present, Exercise limited by: None identified  Goals    . Exercise 3x per week (30 min per time)     Recommend to start back exercising 3 days a week for 30 minutes at a time. (Pt to start a silver sneakers class)       Fall Risk Fall Risk  09/28/2017 09/09/2017 08/11/2016 07/22/2015 11/06/2014  Falls in the past year? Yes No Yes Yes No  Number falls in past yr: 1 - 1 1 -  Comment - - tripped  - -  Injury with Fall? No - No No -  Follow up Falls prevention discussed - Falls prevention discussed - -   Is the patient's home free of loose throw rugs in walkways, pet beds, electrical cords, etc?   yes      Grab bars in the bathroom? yes      Handrails on the stairs?   yes      Adequate lighting?   yes  Timed Get Up and Go performed: N/A  Depression Screen PHQ 2/9 Scores 09/28/2017 09/09/2017 08/11/2016 08/11/2016  PHQ - 2 Score 2 0 0 0  PHQ- 9 Score 7 10 3  -     Cognitive Function: Pt declined screening today.      6CIT Screen 08/11/2016  What Year? 0 points  What month? 0 points  What time? 0 points  Count back from 20 0 points  Months in reverse 0 points  Repeat phrase 4 points  Total Score 4    Immunization History  Administered Date(s) Administered  . Influenza, High Dose Seasonal PF 02/13/2015, 02/08/2017  . Pneumococcal Conjugate-13 02/13/2015  . Pneumococcal Polysaccharide-23 05/01/2017  . Tdap 10/16/2010  . Zoster 10/27/2014    Qualifies for Shingles Vaccine? Due for Shingles vaccine. Declined my offer to administer today.  Education has been provided regarding the importance of this vaccine. Pt has been advised to call her insurance company to determine her out of pocket expense. Advised she may also receive this vaccine at her local pharmacy or Health Dept. Verbalized acceptance and understanding.  Screening Tests Health Maintenance  Topic Date Due  . MAMMOGRAM  09/10/2018 (Originally 10/30/2012)  . INFLUENZA VACCINE  12/09/2017  . TETANUS/TDAP  10/15/2020  . COLONOSCOPY  05/27/2022  . DEXA SCAN  Completed  . Hepatitis C Screening  Completed  . PNA vac Low Risk Adult  Completed    Cancer Screenings: Lung: Low Dose CT Chest recommended if Age 37-80 years, 30 pack-year currently smoking OR have quit w/in 15years. Patient does not qualify. Breast:  Up to date on Mammogram? Yes   Up to date of Bone Density/Dexa? Yes Colorectal: Up to date  Additional Screenings:  Hepatitis C Screening: Up to date     Plan:  I have personally reviewed and addressed the Medicare Annual Wellness questionnaire and have noted the following in the patient's chart:  A. Medical and social history B. Use of alcohol, tobacco or illicit drugs  C. Current medications and supplements D. Functional ability and status E.  Nutritional status F.  Physical activity G. Advance directives H. List of other physicians I.  Hospitalizations, surgeries, and ER visits in previous 12 months J.  [place] such as hearing and vision if needed, cognitive and depression L. Referrals and appointments - none  In addition, I have reviewed and discussed  with patient certain preventive protocols, quality metrics, and best practice recommendations. A written personalized care plan for preventive services as well as general preventive health recommendations were provided to patient.  See attached scanned questionnaire for additional information.   Signed,  Fabio Neighbors, LPN Nurse Health Advisor   Nurse Recommendations: None.

## 2017-10-15 DIAGNOSIS — D2262 Melanocytic nevi of left upper limb, including shoulder: Secondary | ICD-10-CM | POA: Diagnosis not present

## 2017-10-15 DIAGNOSIS — Z85828 Personal history of other malignant neoplasm of skin: Secondary | ICD-10-CM | POA: Diagnosis not present

## 2017-10-15 DIAGNOSIS — L820 Inflamed seborrheic keratosis: Secondary | ICD-10-CM | POA: Diagnosis not present

## 2017-10-15 DIAGNOSIS — L570 Actinic keratosis: Secondary | ICD-10-CM | POA: Diagnosis not present

## 2017-10-15 DIAGNOSIS — L812 Freckles: Secondary | ICD-10-CM | POA: Diagnosis not present

## 2017-10-15 DIAGNOSIS — L578 Other skin changes due to chronic exposure to nonionizing radiation: Secondary | ICD-10-CM | POA: Diagnosis not present

## 2017-10-15 DIAGNOSIS — L508 Other urticaria: Secondary | ICD-10-CM | POA: Diagnosis not present

## 2017-10-15 DIAGNOSIS — L821 Other seborrheic keratosis: Secondary | ICD-10-CM | POA: Diagnosis not present

## 2017-10-15 DIAGNOSIS — L720 Epidermal cyst: Secondary | ICD-10-CM | POA: Diagnosis not present

## 2017-10-22 ENCOUNTER — Other Ambulatory Visit: Payer: Self-pay | Admitting: Physician Assistant

## 2017-10-22 ENCOUNTER — Other Ambulatory Visit: Payer: Self-pay | Admitting: Family Medicine

## 2017-10-22 DIAGNOSIS — Z1231 Encounter for screening mammogram for malignant neoplasm of breast: Secondary | ICD-10-CM

## 2017-10-27 ENCOUNTER — Telehealth: Payer: Self-pay

## 2017-10-27 ENCOUNTER — Other Ambulatory Visit: Payer: Self-pay | Admitting: Physician Assistant

## 2017-10-27 DIAGNOSIS — F32A Depression, unspecified: Secondary | ICD-10-CM

## 2017-10-27 DIAGNOSIS — F329 Major depressive disorder, single episode, unspecified: Secondary | ICD-10-CM

## 2017-10-27 NOTE — Telephone Encounter (Signed)
Tiffany from [place] called stating Ms. Baldinger's Fluoxetine has two sets of directions.  She is wanting to know which directions to use.   Thanks,   -Mickel Baas

## 2017-10-27 NOTE — Telephone Encounter (Signed)
40 mg or 2 capsules a day.

## 2017-10-28 NOTE — Telephone Encounter (Signed)
Pharmacy advised.   Thanks,   -Yuko Coventry  

## 2017-11-15 ENCOUNTER — Other Ambulatory Visit: Payer: Self-pay | Admitting: Physician Assistant

## 2017-11-15 DIAGNOSIS — E7800 Pure hypercholesterolemia, unspecified: Secondary | ICD-10-CM

## 2017-11-17 ENCOUNTER — Ambulatory Visit
Admission: RE | Admit: 2017-11-17 | Discharge: 2017-11-17 | Disposition: A | Discharge status: Home / Self Care | Payer: Medicare HMO | Source: Ambulatory Visit | Attending: Physician Assistant | Admitting: Physician Assistant

## 2017-11-17 DIAGNOSIS — Z1231 Encounter for screening mammogram for malignant neoplasm of breast: Secondary | ICD-10-CM | POA: Diagnosis not present

## 2017-11-29 DIAGNOSIS — L578 Other skin changes due to chronic exposure to nonionizing radiation: Secondary | ICD-10-CM | POA: Diagnosis not present

## 2017-11-29 DIAGNOSIS — L820 Inflamed seborrheic keratosis: Secondary | ICD-10-CM | POA: Diagnosis not present

## 2017-11-29 DIAGNOSIS — L570 Actinic keratosis: Secondary | ICD-10-CM | POA: Diagnosis not present

## 2017-11-29 DIAGNOSIS — L821 Other seborrheic keratosis: Secondary | ICD-10-CM | POA: Diagnosis not present

## 2017-11-30 ENCOUNTER — Ambulatory Visit
Admission: RE | Admit: 2017-11-30 | Discharge: 2017-11-30 | Disposition: A | Discharge status: Home / Self Care | Payer: Medicare HMO | Source: Ambulatory Visit | Attending: Family Medicine | Admitting: Family Medicine

## 2017-11-30 ENCOUNTER — Ambulatory Visit
Admission: RE | Admit: 2017-11-30 | Discharge: 2017-11-30 | Disposition: A | Discharge status: Home / Self Care | Payer: Medicare HMO | Source: Ambulatory Visit | Attending: Physician Assistant | Admitting: Physician Assistant

## 2017-11-30 DIAGNOSIS — M8580 Other specified disorders of bone density and structure, unspecified site: Secondary | ICD-10-CM | POA: Diagnosis present

## 2017-11-30 DIAGNOSIS — M85859 Other specified disorders of bone density and structure, unspecified thigh: Secondary | ICD-10-CM | POA: Insufficient documentation

## 2017-11-30 DIAGNOSIS — M810 Age-related osteoporosis without current pathological fracture: Secondary | ICD-10-CM | POA: Diagnosis not present

## 2017-11-30 DIAGNOSIS — Z780 Asymptomatic menopausal state: Secondary | ICD-10-CM | POA: Diagnosis not present

## 2017-11-30 DIAGNOSIS — M8588 Other specified disorders of bone density and structure, other site: Secondary | ICD-10-CM | POA: Diagnosis not present

## 2017-12-03 ENCOUNTER — Other Ambulatory Visit: Payer: Self-pay

## 2017-12-03 DIAGNOSIS — M810 Age-related osteoporosis without current pathological fracture: Secondary | ICD-10-CM

## 2017-12-03 NOTE — Telephone Encounter (Signed)
Pt advised.  Please send RX to [facility].  Pt scheduled two month follow up for 02/04/2018

## 2017-12-03 NOTE — Telephone Encounter (Signed)
-----   Message from Trinna Post, [place] sent at 12/02/2017  3:34 PM EDT ----- Bone density shows osteoporosis, which is a weakening of the bones. This requires treatment, which we do with the medication Fosamax. This is a 70 mg pill taken once weekly. It has to be taken with a full cup of water and she has to be able to sit up for half an hour afterwards. If she wants me to send this in, we can and have her follow up in clinic in about two months. We usually give this medication for about five years.

## 2017-12-06 ENCOUNTER — Other Ambulatory Visit: Payer: Self-pay | Admitting: Physician Assistant

## 2017-12-06 DIAGNOSIS — E039 Hypothyroidism, unspecified: Secondary | ICD-10-CM

## 2017-12-06 NOTE — Telephone Encounter (Signed)
Pt called back saying the Fosamax has not been sent to the pharmacy as of today.  She uses [address]  [employer]

## 2017-12-07 MED ORDER — ALENDRONATE SODIUM 70 MG PO TABS: 70.0000 mg | ORAL_TABLET | ORAL | 2 refills | Status: DC

## 2017-12-13 DIAGNOSIS — E785 Hyperlipidemia, unspecified: Secondary | ICD-10-CM | POA: Diagnosis not present

## 2017-12-13 DIAGNOSIS — G629 Polyneuropathy, unspecified: Secondary | ICD-10-CM | POA: Diagnosis not present

## 2017-12-13 DIAGNOSIS — J45909 Unspecified asthma, uncomplicated: Secondary | ICD-10-CM | POA: Diagnosis not present

## 2017-12-13 DIAGNOSIS — R69 Illness, unspecified: Secondary | ICD-10-CM | POA: Diagnosis not present

## 2017-12-13 DIAGNOSIS — M810 Age-related osteoporosis without current pathological fracture: Secondary | ICD-10-CM | POA: Diagnosis not present

## 2017-12-13 DIAGNOSIS — E039 Hypothyroidism, unspecified: Secondary | ICD-10-CM | POA: Diagnosis not present

## 2017-12-13 DIAGNOSIS — H04129 Dry eye syndrome of unspecified lacrimal gland: Secondary | ICD-10-CM | POA: Diagnosis not present

## 2017-12-13 DIAGNOSIS — K5900 Constipation, unspecified: Secondary | ICD-10-CM | POA: Diagnosis not present

## 2017-12-13 DIAGNOSIS — K219 Gastro-esophageal reflux disease without esophagitis: Secondary | ICD-10-CM | POA: Diagnosis not present

## 2017-12-14 DIAGNOSIS — R69 Illness, unspecified: Secondary | ICD-10-CM | POA: Diagnosis not present

## 2017-12-31 ENCOUNTER — Encounter: Payer: Self-pay | Admitting: Family Medicine

## 2017-12-31 ENCOUNTER — Ambulatory Visit (INDEPENDENT_AMBULATORY_CARE_PROVIDER_SITE_OTHER): Payer: Medicare HMO | Admitting: Family Medicine

## 2017-12-31 VITALS — BP 130/72 | HR 66 | Temp 97.3°F | Resp 16 | Wt 123.0 lb

## 2017-12-31 DIAGNOSIS — K219 Gastro-esophageal reflux disease without esophagitis: Secondary | ICD-10-CM

## 2017-12-31 MED ORDER — OMEPRAZOLE 20 MG PO CPDR: 20.0000 mg | DELAYED_RELEASE_CAPSULE | Freq: Every day | ORAL | 3 refills | Status: DC

## 2017-12-31 NOTE — Progress Notes (Signed)
Patient: Hailey Cain Female    DOB: [birth year]   72 y.o.   MRN: [id number] Visit Date: 12/31/2017  Today's Provider: Lavon Paganini, MD   I, Martha Clan, CMA, am acting as scribe for Lavon Paganini, MD.  Chief Complaint  Patient presents with  . Gastroesophageal Reflux   Subjective:    Gastroesophageal Reflux  She complains of abdominal pain, belching, chest pain, coughing, early satiety, globus sensation, heartburn, a hoarse voice, nausea, a sore throat and water brash. She reports no choking, no dysphagia or no wheezing. This is a recurrent problem. The problem has been gradually worsening. Nothing aggravates the symptoms. Associated symptoms include muscle weakness. Pertinent negatives include no fatigue, melena, orthopnea or weight loss. She has tried a histamine-2 antagonist for the symptoms. The treatment provided moderate relief.   Only takes Zantac prn, not regularly.  Has been taking daily for last 2 months.  Seems to ease the pain, but not totally resolve it.  States that she tried Nexium in the past and that seemed to really help at that time.  She has not been taking this recently.  She does not think she is ever had an EGD.     No Known Allergies   Current Outpatient Medications:  .  alendronate (FOSAMAX) 70 MG tablet, Take 1 tablet (70 mg total) by mouth every 7 (seven) days. Take with a full glass of water on an empty stomach., Disp: 4 tablet, Rfl: 2 .  aspirin 81 MG tablet, Take 81 mg by mouth daily. , Disp: , Rfl:  .  Calcium Carbonate (CALCIUM 600 PO), Take by mouth., Disp: , Rfl:  .  Cholecalciferol 5000 units capsule, Take 5,000 Units by mouth daily. , Disp: , Rfl:  .  cyanocobalamin 100 MCG tablet, Take 100 mcg by mouth daily. , Disp: , Rfl:  .  FLUoxetine (PROZAC) 20 MG capsule, Take 2 capsules (40 mg total) by mouth daily. TAKE 3 CAPSULES BY MOUTH ONCE DAILY, Disp: 180 capsule, Rfl: 1 .  levothyroxine (SYNTHROID, LEVOTHROID) 75 MCG tablet,  TAKE 1 TABLET BY MOUTH ONCE DAILY, Disp: 90 tablet, Rfl: 1 .  loratadine (CLARITIN) 10 MG tablet, Take 1 tablet (10 mg total) by mouth daily. Reported on 05/17/2015, Disp: 90 tablet, Rfl: 1 .  montelukast (SINGULAIR) 10 MG tablet, Take 1 tablet (10 mg total) by mouth at bedtime. Reported on 05/17/2015, Disp: 90 tablet, Rfl: 1 .  Multiple Vitamins-Minerals (EMERGEN-C IMMUNE PO), Take 1 tablet by mouth daily. , Disp: , Rfl:  .  ranitidine (ZANTAC) 150 MG tablet, Take 150 mg by mouth daily as needed for heartburn., Disp: , Rfl:  .  simvastatin (ZOCOR) 40 MG tablet, TAKE 1 TABLET BY MOUTH ONCE DAILY, Disp: 90 tablet, Rfl: 1  Review of Systems  Constitutional: Negative for fatigue and weight loss.  HENT: Positive for hoarse voice and sore throat.   Respiratory: Positive for cough. Negative for choking and wheezing.   Cardiovascular: Positive for chest pain.  Gastrointestinal: Positive for abdominal pain, heartburn and nausea. Negative for dysphagia and melena.  Musculoskeletal: Positive for muscle weakness.    Social History   Tobacco Use  . Smoking status: Former Smoker    Packs/day: 1.00    Years: 20.00    Pack years: 20.00    Last attempt to quit: 05/12/2003    Years since quitting: 14.6  . Smokeless tobacco: Never Used  Substance Use Topics  . Alcohol use: No  Alcohol/week: 0.0 standard drinks   Objective:   BP 130/72 (BP Location: Left Arm, Patient Position: Sitting, Cuff Size: Normal)   Pulse 66   Temp (!) 97.3 F (36.3 C) (Oral)   Resp 16   Wt 123 lb (55.8 kg)   SpO2 96%   BMI 21.79 kg/m  Vitals:   12/31/17 1047  BP: 130/72  Pulse: 66  Resp: 16  Temp: (!) 97.3 F (36.3 C)  TempSrc: Oral  SpO2: 96%  Weight: 123 lb (55.8 kg)     Physical Exam  Constitutional: She is oriented to person, place, and time. She appears well-developed and well-nourished. No distress.  HENT:  Head: Normocephalic and atraumatic.  Mouth/Throat: Oropharynx is clear and moist.  Eyes: Pupils  are equal, round, and reactive to light. Conjunctivae are normal. No scleral icterus.  Neck: Neck supple. No thyromegaly present.  Cardiovascular: Normal rate, regular rhythm, normal heart sounds and intact distal pulses.  No murmur heard. Pulmonary/Chest: Effort normal and breath sounds normal. No respiratory distress. She has no wheezes. She has no rales.  Abdominal: Soft. Bowel sounds are normal. She exhibits no distension. There is no tenderness. There is no rebound and no guarding.  Musculoskeletal: She exhibits no edema.  Lymphadenopathy:    She has no cervical adenopathy.  Neurological: She is alert and oriented to person, place, and time.  Skin: Skin is warm and dry. Capillary refill takes less than 2 seconds. No rash noted.  Psychiatric: She has a normal mood and affect. Her behavior is normal.  Vitals reviewed.      Assessment & Plan:   Problem List Items Addressed This Visit      Digestive   GERD (gastroesophageal reflux disease) - Primary    Chronic and intermittent Currently uncontrolled Will stop H2 blocker and try PPI therapy for 1 month She has a follow-up scheduled with her PCP in 1 month, at which time this can be followed up If she continues to have symptoms despite PPI therapy, could consider GI referral for possible EGD Discussed return precautions with patient      Relevant Medications   omeprazole (PRILOSEC) 20 MG capsule       Return in about 4 weeks (around 01/28/2018) for GERD f/u (already has appt with PCP).   The entirety of the information documented in the History of Present Illness, Review of Systems and Physical Exam were personally obtained by me. Portions of this information were initially documented by Raquel Sarna Ratchford, CMA and reviewed by me for thoroughness and accuracy.    Virginia Crews, MD, MPH [facility] 12/31/2017 4:48 PM

## 2017-12-31 NOTE — Patient Instructions (Signed)
Food Choices for Gastroesophageal Reflux Disease, Adult When you have gastroesophageal reflux disease (GERD), the foods you eat and your eating habits are very important. Choosing the right foods can help ease your discomfort. What guidelines do I need to follow?  Choose fruits, vegetables, whole grains, and low-fat dairy products.  Choose low-fat meat, fish, and poultry.  Limit fats such as oils, salad dressings, butter, nuts, and avocado.  Keep a food diary. This helps you identify foods that cause symptoms.  Avoid foods that cause symptoms. These may be different for everyone.  Eat small meals often instead of 3 large meals a day.  Eat your meals slowly, in a place where you are relaxed.  Limit fried foods.  Cook foods using methods other than frying.  Avoid drinking alcohol.  Avoid drinking large amounts of liquids with your meals.  Avoid bending over or lying down until 2-3 hours after eating. What foods are not recommended? These are some foods and drinks that may make your symptoms worse: Vegetables  Tomatoes. Tomato juice. Tomato and spaghetti sauce. Chili peppers. Onion and garlic. Horseradish. Fruits  Oranges, grapefruit, and lemon (fruit and juice). Meats  High-fat meats, fish, and poultry. This includes hot dogs, ribs, ham, sausage, salami, and bacon. Dairy  Whole milk and chocolate milk. Sour cream. Cream. Butter. Ice cream. Cream cheese. Drinks  Coffee and tea. Bubbly (carbonated) drinks or energy drinks. Condiments  Hot sauce. Barbecue sauce. Sweets/Desserts  Chocolate and cocoa. Donuts. Peppermint and spearmint. Fats and Oils  High-fat foods. This includes French fries and potato chips. Other  Vinegar. Strong spices. This includes black pepper, white pepper, red pepper, cayenne, curry powder, cloves, ginger, and chili powder. The items listed above may not be a complete list of foods and drinks to avoid. Contact your dietitian for more information.    This information is not intended to replace advice given to you by your health care provider. Make sure you discuss any questions you have with your health care provider. Document Released: 10/27/2011 Document Revised: 10/03/2015 Document Reviewed: 03/01/2013 Elsevier Interactive Patient Education  2017 [employer] Inc[employer]  

## 2017-12-31 NOTE — Assessment & Plan Note (Signed)
Chronic and intermittent Currently uncontrolled Will stop H2 blocker and try PPI therapy for 1 month She has a follow-up scheduled with her PCP in 1 month, at which time this can be followed up If she continues to have symptoms despite PPI therapy, could consider GI referral for possible EGD Discussed return precautions with patient

## 2018-02-03 NOTE — Progress Notes (Signed)
Patient: Hailey Cain Female    DOB: 11/06/1945   72 y.o.   MRN: [id number] Visit Date: 02/04/2018  Today's Provider: Trinna Post, PA-C   Chief Complaint  Patient presents with  . Follow-up    Osteoporosis   Subjective:    HPI  Follow up for osteoporosis  The patient was last seen for this 2 months ago. Changes made at last visit include start Fosamax 70 mg once a week.  She reports excellent compliance with treatment. She feels that condition is Improved. She is not having side effects.   Wt Readings from Last 3 Encounters:  02/04/18 124 lb 9.6 oz (56.5 kg)  12/31/17 123 lb (55.8 kg)  09/28/17 117 lb (53.1 kg)   Acid Reflux  Was seen in this clinic last month by Dr. Brita Romp, her H2 blocker was changed to omeprazole. She reports good symptoms relief with this. She does have a history of small bowel obstruction that was managed conservatively with NG tube in the hospital. She reports a lifetime of constipation, takes Miralax daily with her coffee. She has been having some bloating recently but this has decreased in the past few weeks. No blood in her stool, Colonoscopy 2014 normal except for internal hemorrhoids.   Restless Leg  Reports urge to move legs once she sits still. Feels fidgety and she massages them to help them feel better. Denies tremors or difficulty walking. She has always had this to some extent.  ------------------------------------------------------------------------------------      No Known Allergies   Current Outpatient Medications:  .  alendronate (FOSAMAX) 70 MG tablet, Take 1 tablet (70 mg total) by mouth every 7 (seven) days. Take with a full glass of water on an empty stomach., Disp: 4 tablet, Rfl: 2 .  aspirin 81 MG tablet, Take 81 mg by mouth daily. , Disp: , Rfl:  .  Calcium Carbonate (CALCIUM 600 PO), Take by mouth., Disp: , Rfl:  .  Cholecalciferol 5000 units capsule, Take 5,000 Units by mouth daily. , Disp: , Rfl:  .   cyanocobalamin 100 MCG tablet, Take 100 mcg by mouth daily. , Disp: , Rfl:  .  FLUoxetine (PROZAC) 20 MG capsule, Take 2 capsules (40 mg total) by mouth daily. TAKE 3 CAPSULES BY MOUTH ONCE DAILY, Disp: 180 capsule, Rfl: 1 .  levothyroxine (SYNTHROID, LEVOTHROID) 75 MCG tablet, TAKE 1 TABLET BY MOUTH ONCE DAILY, Disp: 90 tablet, Rfl: 1 .  loratadine (CLARITIN) 10 MG tablet, Take 1 tablet (10 mg total) by mouth daily. Reported on 05/17/2015, Disp: 90 tablet, Rfl: 1 .  montelukast (SINGULAIR) 10 MG tablet, Take 1 tablet (10 mg total) by mouth at bedtime. Reported on 05/17/2015, Disp: 90 tablet, Rfl: 1 .  Multiple Vitamins-Minerals (EMERGEN-C IMMUNE PO), Take 1 tablet by mouth daily. , Disp: , Rfl:  .  omeprazole (PRILOSEC) 20 MG capsule, Take 1 capsule (20 mg total) by mouth daily., Disp: 30 capsule, Rfl: 3 .  ranitidine (ZANTAC) 150 MG tablet, Take 150 mg by mouth daily as needed for heartburn., Disp: , Rfl:  .  simvastatin (ZOCOR) 40 MG tablet, TAKE 1 TABLET BY MOUTH ONCE DAILY, Disp: 90 tablet, Rfl: 1  Review of Systems  Constitutional: Negative.   Respiratory: Negative.   Cardiovascular: Negative.   Musculoskeletal: Negative.     Social History   Tobacco Use  . Smoking status: Former Smoker    Packs/day: 1.00    Years: 20.00    Pack years:  20.00    Last attempt to quit: 05/12/2003    Years since quitting: 14.7  . Smokeless tobacco: Never Used  Substance Use Topics  . Alcohol use: No    Alcohol/week: 0.0 standard drinks   Objective:   BP 130/78 (BP Location: Left Arm, Patient Position: Sitting, Cuff Size: Normal)   Pulse (!) 58   Temp 97.7 F (36.5 C) (Oral)   Resp 16   Wt 124 lb 9.6 oz (56.5 kg)   SpO2 99%   BMI 22.07 kg/m  Vitals:   02/04/18 0847  BP: 130/78  Pulse: (!) 58  Resp: 16  Temp: 97.7 F (36.5 C)  TempSrc: Oral  SpO2: 99%  Weight: 124 lb 9.6 oz (56.5 kg)     Physical Exam  Constitutional: She is oriented to person, place, and time. She appears  well-developed and well-nourished.  Cardiovascular: Normal rate and regular rhythm.  Pulmonary/Chest: Effort normal and breath sounds normal.  Abdominal: Soft. Bowel sounds are normal.  Neurological: She is alert and oriented to person, place, and time.  Skin: Skin is warm and dry.  Psychiatric: She has a normal mood and affect. Her behavior is normal.        Assessment & Plan:     1. Other osteoporosis, unspecified pathological fracture presence  She is taking Fosamax without issue. Clarified that she doesn't need to sit still for 30 minutes, she just needs to remain upright after taking the medication.  2. Need for influenza vaccination  Updated today.  3. Gastroesophageal reflux disease, esophagitis presence not specified  Doing well with omeprazole, may continue this. She does have some bloating and fullness in her stomach. If this continues or worsens, she will call back and we can refer to GI due to her history.  4. Restless leg  Trial of medication as below. Will start as night but may increase to daytime dosing as well.  - rOPINIRole (REQUIP) 0.25 MG tablet; Take 1 tablet (0.25 mg total) by mouth at bedtime as needed.  Dispense: 90 tablet; Refill: 0  Return in about 2 months (around 04/06/2018) for restless leg, acid reflux.  The entirety of the information documented in the History of Present Illness, Review of Systems and Physical Exam were personally obtained by me. Portions of this information were initially documented by Lynford Humphrey, CMA and reviewed by me for thoroughness and accuracy.        Trinna Post, PA-C  [facility]

## 2018-02-04 ENCOUNTER — Encounter: Payer: Self-pay | Admitting: Physician Assistant

## 2018-02-04 ENCOUNTER — Ambulatory Visit (INDEPENDENT_AMBULATORY_CARE_PROVIDER_SITE_OTHER): Payer: Medicare HMO | Admitting: Physician Assistant

## 2018-02-04 VITALS — BP 130/78 | HR 58 | Temp 97.7°F | Resp 16 | Wt 124.6 lb

## 2018-02-04 DIAGNOSIS — K219 Gastro-esophageal reflux disease without esophagitis: Secondary | ICD-10-CM | POA: Diagnosis not present

## 2018-02-04 DIAGNOSIS — Z23 Encounter for immunization: Secondary | ICD-10-CM

## 2018-02-04 DIAGNOSIS — M818 Other osteoporosis without current pathological fracture: Secondary | ICD-10-CM | POA: Diagnosis not present

## 2018-02-04 DIAGNOSIS — G2581 Restless legs syndrome: Secondary | ICD-10-CM

## 2018-02-04 MED ORDER — ROPINIROLE HCL 0.25 MG PO TABS: 0.2500 mg | ORAL_TABLET | Freq: Every evening | ORAL | 0 refills | Status: DC | PRN

## 2018-02-04 NOTE — Patient Instructions (Signed)
Osteoporosis Osteoporosis happens when your bones become thinner and weaker. Weak bones can break (fracture) more easily when you slip or fall. Bones most at risk of breaking are in the hip, wrist, and spine. Follow these instructions at home:  Get enough calcium and vitamin D. These nutrients are good for your bones.  Exercise as told by your doctor.  Do not use any tobacco products. This includes cigarettes, chewing tobacco, and electronic cigarettes. If you need help quitting, ask your doctor.  Limit the amount of alcohol you drink.  Take medicines only as told by your doctor.  Keep all follow-up visits as told by your doctor. This is important.  Take care at home to prevent falls. Some ways to do this are: ? Keep rooms well lit and tidy. ? Put safety rails on your stairs. ? Put a rubber mat in the bathroom and other places that are often wet or slippery. Get help right away if:  You fall.  You hurt yourself. This information is not intended to replace advice given to you by your health care provider. Make sure you discuss any questions you have with your health care provider. Document Released: 07/20/2011 Document Revised: 10/03/2015 Document Reviewed: 10/05/2013 Elsevier Interactive Patient Education  2018 Elsevier Inc.  

## 2018-02-07 DIAGNOSIS — L578 Other skin changes due to chronic exposure to nonionizing radiation: Secondary | ICD-10-CM | POA: Diagnosis not present

## 2018-02-07 DIAGNOSIS — L821 Other seborrheic keratosis: Secondary | ICD-10-CM | POA: Diagnosis not present

## 2018-02-07 DIAGNOSIS — L820 Inflamed seborrheic keratosis: Secondary | ICD-10-CM | POA: Diagnosis not present

## 2018-02-07 DIAGNOSIS — Z85828 Personal history of other malignant neoplasm of skin: Secondary | ICD-10-CM | POA: Diagnosis not present

## 2018-02-07 DIAGNOSIS — L570 Actinic keratosis: Secondary | ICD-10-CM | POA: Diagnosis not present

## 2018-02-07 DIAGNOSIS — L908 Other atrophic disorders of skin: Secondary | ICD-10-CM | POA: Diagnosis not present

## 2018-02-23 ENCOUNTER — Telehealth: Payer: Self-pay | Admitting: Physician Assistant

## 2018-02-23 DIAGNOSIS — M810 Age-related osteoporosis without current pathological fracture: Secondary | ICD-10-CM

## 2018-02-23 MED ORDER — ALENDRONATE SODIUM 70 MG PO TABS: 70.0000 mg | ORAL_TABLET | ORAL | 2 refills | Status: DC

## 2018-02-23 NOTE — Telephone Encounter (Signed)
Sent medication in

## 2018-02-23 NOTE — Telephone Encounter (Signed)
Pt needs a refill  Alendronate 70 mg  [place]  CB#  [phone]  Thanks [employer]

## 2018-02-28 ENCOUNTER — Other Ambulatory Visit: Payer: Self-pay | Admitting: Physician Assistant

## 2018-02-28 DIAGNOSIS — E7800 Pure hypercholesterolemia, unspecified: Secondary | ICD-10-CM

## 2018-03-08 DIAGNOSIS — H0015 Chalazion left lower eyelid: Secondary | ICD-10-CM | POA: Diagnosis not present

## 2018-03-29 ENCOUNTER — Ambulatory Visit (INDEPENDENT_AMBULATORY_CARE_PROVIDER_SITE_OTHER): Payer: Medicare HMO | Admitting: Physician Assistant

## 2018-03-29 ENCOUNTER — Encounter: Payer: Self-pay | Admitting: Physician Assistant

## 2018-03-29 ENCOUNTER — Other Ambulatory Visit: Payer: Self-pay | Admitting: Physician Assistant

## 2018-03-29 VITALS — BP 122/82 | HR 78 | Temp 98.5°F | Wt 125.0 lb

## 2018-03-29 DIAGNOSIS — F341 Dysthymic disorder: Secondary | ICD-10-CM

## 2018-03-29 DIAGNOSIS — R7303 Prediabetes: Secondary | ICD-10-CM

## 2018-03-29 DIAGNOSIS — I6381 Other cerebral infarction due to occlusion or stenosis of small artery: Secondary | ICD-10-CM

## 2018-03-29 DIAGNOSIS — R69 Illness, unspecified: Secondary | ICD-10-CM | POA: Diagnosis not present

## 2018-03-29 DIAGNOSIS — E7800 Pure hypercholesterolemia, unspecified: Secondary | ICD-10-CM

## 2018-03-29 DIAGNOSIS — M810 Age-related osteoporosis without current pathological fracture: Secondary | ICD-10-CM

## 2018-03-29 DIAGNOSIS — E039 Hypothyroidism, unspecified: Secondary | ICD-10-CM

## 2018-03-29 DIAGNOSIS — J309 Allergic rhinitis, unspecified: Secondary | ICD-10-CM

## 2018-03-29 MED ORDER — MONTELUKAST SODIUM 10 MG PO TABS: 10.0000 mg | ORAL_TABLET | Freq: Every day | ORAL | 1 refills | Status: DC

## 2018-03-29 MED ORDER — LORATADINE 10 MG PO TABS: 10.0000 mg | ORAL_TABLET | Freq: Every day | ORAL | 1 refills | Status: DC

## 2018-03-29 NOTE — Progress Notes (Signed)
Patient: Hailey Cain Female    DOB: [birth year]   72 y.o.   MRN: [id number] Visit Date: 04/01/2018  Today's Provider: Trinna Post, PA-C   Chief Complaint  Patient presents with  . Hyperlipidemia  . Depression  . Hypothyroidism   Subjective:    HPI  Lipid/Cholesterol, Follow-up:   Last seen for this 6 months ago.  Management changes since that visit include none. . Last Lipid Panel:    Component Value Date/Time   CHOL 186 03/29/2018 0955   TRIG 122 03/29/2018 0955   HDL 69 03/29/2018 0955   CHOLHDL 2.7 03/29/2018 0955   LDLCALC 93 03/29/2018 0955    Risk factors for vascular disease include hypercholesterolemia  She reports good compliance with treatment. She is not having side effects.  Current symptoms include none and have been stable. Weight trend: stable Prior visit with dietician: no Current diet: well balanced Current exercise: none  Wt Readings from Last 3 Encounters:  03/29/18 125 lb (56.7 kg)  02/04/18 124 lb 9.6 oz (56.5 kg)  12/31/17 123 lb (55.8 kg)    -------------------------------------------------------------------  Hypothyroid, follow-up:  TSH  Date Value Ref Range Status  09/09/2017 4.240 0.450 - 4.500 uIU/mL Final  08/11/2016 2.760 0.450 - 4.500 uIU/mL Final  07/22/2015 15.300 (H) 0.450 - 4.500 uIU/mL Final   Wt Readings from Last 3 Encounters:  03/29/18 125 lb (56.7 kg)  02/04/18 124 lb 9.6 oz (56.5 kg)  12/31/17 123 lb (55.8 kg)    She was last seen for hypothyroid 6 months ago.  Management since that visit includes none. She reports good compliance with treatment. She is not having side effects.  She is not exercising. She is experiencing none She denies change in energy level Weight trend: stable  ------------------------------------------------------------------------  Depression, Follow-up  Shewas last seen for this 6 monthsago. Changes made at last visit include none Shereports  (excellent)compliance with treatment. She (is not)having side effects  Shereports (good)tolerance of treatment. Current symptoms include: Patient states she in not having no symptoms at this time.  Depression screen [facility] 2/9 03/29/2018 09/28/2017 09/09/2017 08/11/2016 08/11/2016  Decreased Interest 0 1 0 0 0  Down, Depressed, Hopeless 0 1 0 0 0  PHQ - 2 Score 0 2 0 0 0  Altered sleeping 0 3 3 0 -  Tired, decreased energy 0 2 3 1  -  Change in appetite 0 0 3 0 -  Feeling bad or failure about yourself  0 0 1 1 -  Trouble concentrating 0 0 0 1 -  Moving slowly or fidgety/restless 0 0 0 0 -  Suicidal thoughts 0 0 0 0 -  PHQ-9 Score 0 7 10 3  -  Difficult doing work/chores Not difficult at all Not difficult at all Not difficult at all - -   Prediabetes: Reports being fairly active. Does have bowl of ice cream every night.   Lab Results  Component Value Date   HGBA1C 5.8 (H) 03/29/2018   Osteoporosis: T score -2.6 on last DEXA, tolerating fosamax without issue.     No Known Allergies   Current Outpatient Medications:  .  alendronate (FOSAMAX) 70 MG tablet, Take 1 tablet (70 mg total) by mouth every 7 (seven) days. Take with a full glass of water on an empty stomach., Disp: 4 tablet, Rfl: 2 .  aspirin 81 MG tablet, Take 81 mg by mouth daily. , Disp: , Rfl:  .  Calcium Carbonate (CALCIUM 600 PO), Take  by mouth., Disp: , Rfl:  .  Cholecalciferol 5000 units capsule, Take 5,000 Units by mouth daily. , Disp: , Rfl:  .  cyanocobalamin 100 MCG tablet, Take 100 mcg by mouth daily. , Disp: , Rfl:  .  FLUoxetine (PROZAC) 20 MG capsule, Take 2 capsules (40 mg total) by mouth daily. TAKE 3 CAPSULES BY MOUTH ONCE DAILY, Disp: 180 capsule, Rfl: 1 .  levothyroxine (SYNTHROID, LEVOTHROID) 75 MCG tablet, TAKE 1 TABLET BY MOUTH ONCE DAILY, Disp: 90 tablet, Rfl: 1 .  Multiple Vitamins-Minerals (EMERGEN-C IMMUNE PO), Take 1 tablet by mouth daily. , Disp: , Rfl:  .  omeprazole (PRILOSEC) 20 MG capsule, Take  1 capsule (20 mg total) by mouth daily., Disp: 30 capsule, Rfl: 3 .  simvastatin (ZOCOR) 40 MG tablet, TAKE 1 TABLET BY MOUTH ONCE DAILY, Disp: 90 tablet, Rfl: 1 .  loratadine (CLARITIN) 10 MG tablet, Take 1 tablet (10 mg total) by mouth daily. Reported on 05/17/2015, Disp: 90 tablet, Rfl: 1 .  montelukast (SINGULAIR) 10 MG tablet, Take 1 tablet (10 mg total) by mouth at bedtime. Reported on 05/17/2015, Disp: 90 tablet, Rfl: 1  Review of Systems  Constitutional: Negative.   HENT: Negative.   Respiratory: Negative.   Cardiovascular: Negative.   Gastrointestinal: Negative.   Neurological: Negative.     Social History   Tobacco Use  . Smoking status: Former Smoker    Packs/day: 1.00    Years: 20.00    Pack years: 20.00    Last attempt to quit: 05/12/2003    Years since quitting: 14.8  . Smokeless tobacco: Never Used  Substance Use Topics  . Alcohol use: No    Alcohol/week: 0.0 standard drinks   Objective:   BP 122/82 (BP Location: Left Arm, Patient Position: Sitting, Cuff Size: Normal)   Pulse 78   Temp 98.5 F (36.9 C) (Oral)   Wt 125 lb (56.7 kg)   SpO2 96%   BMI 22.14 kg/m  Vitals:   03/29/18 0924  BP: 122/82  Pulse: 78  Temp: 98.5 F (36.9 C)  TempSrc: Oral  SpO2: 96%  Weight: 125 lb (56.7 kg)     Physical Exam  Constitutional: She is oriented to person, place, and time. She appears well-developed and well-nourished.  Cardiovascular: Normal rate and regular rhythm.  Pulmonary/Chest: Effort normal and breath sounds normal.  Abdominal: Soft. Bowel sounds are normal.  Neurological: She is alert and oriented to person, place, and time.  Skin: Skin is warm and dry.  Psychiatric: She has a normal mood and affect. Her behavior is normal.        Assessment & Plan:     1. Prediabetes  Counseled on exercise interventions.   - HgB A1c - Lipid Profile - Comprehensive Metabolic Panel (CMET)  2. Allergic rhinitis  - montelukast (SINGULAIR) 10 MG tablet; Take 1  tablet (10 mg total) by mouth at bedtime. Reported on 05/17/2015  Dispense: 90 tablet; Refill: 1 - loratadine (CLARITIN) 10 MG tablet; Take 1 tablet (10 mg total) by mouth daily. Reported on 05/17/2015  Dispense: 90 tablet; Refill: 1  3. Lacunar infarction ([place])  - Lipid Profile  4. Adult hypothyroidism  Last TSH stable. Continue synthroid 75 mcg daily.  5. Depression, neurotic  Continue Prozac 20 mg daily.  6. Hypercholesterolemia without hypertriglyceridemia  Continue simvastatin 40 mg daily.  7. Osteoporosis  Continue Fosamax 70 mg once weekly.   Return in about 6 months (around 09/27/2018) for CPE and f/u.  The entirety  of the information documented in the History of Present Illness, Review of Systems and Physical Exam were personally obtained by me. Portions of this information were initially documented by Lynford Humphrey, CMA and reviewed by me for thoroughness and accuracy.         Trinna Post, PA-C  [facility]

## 2018-03-29 NOTE — Patient Instructions (Signed)
Hypothyroidism Hypothyroidism is a disorder of the thyroid. The thyroid is a large gland that is located in the lower front of the neck. The thyroid releases hormones that control how the body works. With hypothyroidism, the thyroid does not make enough of these hormones. What are the causes? Causes of hypothyroidism may include:  Viral infections.  Pregnancy.  Your own defense system (immune system) attacking your thyroid.  Certain medicines.  Birth defects.  Past radiation treatments to your head or neck.  Past treatment with radioactive iodine.  Past surgical removal of part or all of your thyroid.  Problems with the gland that is located in the center of your brain (pituitary).  What are the signs or symptoms? Signs and symptoms of hypothyroidism may include:  Feeling as though you have no energy (lethargy).  Inability to tolerate cold.  Weight gain that is not explained by a change in diet or exercise habits.  Dry skin.  Coarse hair.  Menstrual irregularity.  Slowing of thought processes.  Constipation.  Sadness or depression.  How is this diagnosed? Your health care provider may diagnose hypothyroidism with blood tests and ultrasound tests. How is this treated? Hypothyroidism is treated with medicine that replaces the hormones that your body does not make. After you begin treatment, it may take several weeks for symptoms to go away. Follow these instructions at home:  Take medicines only as directed by your health care provider.  If you start taking any new medicines, tell your health care provider.  Keep all follow-up visits as directed by your health care provider. This is important. As your condition improves, your dosage needs may change. You will need to have blood tests regularly so that your health care provider can watch your condition. Contact a health care provider if:  Your symptoms do not get better with treatment.  You are taking thyroid  replacement medicine and: ? You sweat excessively. ? You have tremors. ? You feel anxious. ? You lose weight rapidly. ? You cannot tolerate heat. ? You have emotional swings. ? You have diarrhea. ? You feel weak. Get help right away if:  You develop chest pain.  You develop an irregular heartbeat.  You develop a rapid heartbeat. This information is not intended to replace advice given to you by your health care provider. Make sure you discuss any questions you have with your health care provider. Document Released: 04/27/2005 Document Revised: 10/03/2015 Document Reviewed: 09/12/2013 Elsevier Interactive Patient Education  2018 Elsevier Inc.  

## 2018-03-30 LAB — COMPREHENSIVE METABOLIC PANEL
ALT: 11 IU/L (ref 0–32)
AST: 18 IU/L (ref 0–40)
Albumin/Globulin Ratio: 2.1 (ref 1.2–2.2)
Albumin: 4.2 g/dL (ref 3.5–4.8)
Alkaline Phosphatase: 47 IU/L (ref 39–117)
BUN/Creatinine Ratio: 15 (ref 12–28)
BUN: 11 mg/dL (ref 8–27)
Bilirubin Total: 0.3 mg/dL (ref 0.0–1.2)
CO2: 22 mmol/L (ref 20–29)
Calcium: 8.9 mg/dL (ref 8.7–10.3)
Chloride: 102 mmol/L (ref 96–106)
Creatinine, Ser: 0.71 mg/dL (ref 0.57–1.00)
GFR calc Af Amer: 98 mL/min/{1.73_m2} (ref >59)
GFR calc non Af Amer: 85 mL/min/{1.73_m2} (ref >59)
Globulin, Total: 2 g/dL (ref 1.5–4.5)
Glucose: 82 mg/dL (ref 65–99)
Potassium: 4.4 mmol/L (ref 3.5–5.2)
Sodium: 140 mmol/L (ref 134–144)
Total Protein: 6.2 g/dL (ref 6.0–8.5)

## 2018-03-30 LAB — LIPID PANEL
Chol/HDL Ratio: 2.7 ratio (ref 0.0–4.4)
Cholesterol, Total: 186 mg/dL (ref 100–199)
HDL: 69 mg/dL (ref >39)
LDL Calculated: 93 mg/dL (ref 0–99)
Triglycerides: 122 mg/dL (ref 0–149)
VLDL Cholesterol Cal: 24 mg/dL (ref 5–40)

## 2018-03-30 LAB — HEMOGLOBIN A1C
Est. average glucose Bld gHb Est-mCnc: 120 mg/dL
Hgb A1c MFr Bld: 5.8 % — ABNORMAL HIGH (ref 4.8–5.6)

## 2018-04-04 ENCOUNTER — Other Ambulatory Visit: Payer: Self-pay | Admitting: Physician Assistant

## 2018-04-04 MED ORDER — OMEPRAZOLE 20 MG PO CPDR: 20.0000 mg | DELAYED_RELEASE_CAPSULE | Freq: Every day | ORAL | 0 refills | Status: DC

## 2018-04-04 NOTE — Telephone Encounter (Signed)
Pt needing a 3 month refill on: omeprazole (PRILOSEC) 20 MG capsule  Please fill at:  [facility] ([facility] [place] [address] [phone] (Phone) [phone] (Fax)   Thanks, [employer]

## 2018-05-09 ENCOUNTER — Other Ambulatory Visit: Payer: Self-pay | Admitting: Physician Assistant

## 2018-05-09 DIAGNOSIS — F32A Depression, unspecified: Secondary | ICD-10-CM

## 2018-05-09 DIAGNOSIS — F329 Major depressive disorder, single episode, unspecified: Secondary | ICD-10-CM

## 2018-05-09 NOTE — Telephone Encounter (Signed)
Pt requesting refill of of FLUoxetine (PROZAC) 20 MG capsule - two pills daily sent to [facility].

## 2018-05-09 NOTE — Telephone Encounter (Signed)
Patient was advised via voicemail that medication was send to pharmacy.

## 2018-05-26 ENCOUNTER — Other Ambulatory Visit: Payer: Self-pay

## 2018-05-26 DIAGNOSIS — M810 Age-related osteoporosis without current pathological fracture: Secondary | ICD-10-CM

## 2018-05-26 MED ORDER — ALENDRONATE SODIUM 70 MG PO TABS: 70.0000 mg | ORAL_TABLET | ORAL | 2 refills | Status: DC

## 2018-05-26 NOTE — Telephone Encounter (Signed)
Refill request for the following medication alendronate (FOSAMAX) 70 MG tablet   Pharmacy: [facility] [address]

## 2018-06-16 ENCOUNTER — Other Ambulatory Visit: Payer: Self-pay | Admitting: Physician Assistant

## 2018-06-16 DIAGNOSIS — E039 Hypothyroidism, unspecified: Secondary | ICD-10-CM

## 2018-06-17 NOTE — Telephone Encounter (Signed)
Pharmacy requesting refills. Thanks!  

## 2018-06-20 ENCOUNTER — Other Ambulatory Visit: Payer: Self-pay | Admitting: Physician Assistant

## 2018-06-20 DIAGNOSIS — E039 Hypothyroidism, unspecified: Secondary | ICD-10-CM

## 2018-06-20 NOTE — Telephone Encounter (Signed)
Pt needs a refill on her   Levothyroxine 75 MCG  [facility] [address]  Thanks teri

## 2018-06-21 ENCOUNTER — Encounter: Payer: Self-pay | Admitting: Physician Assistant

## 2018-06-21 ENCOUNTER — Ambulatory Visit (INDEPENDENT_AMBULATORY_CARE_PROVIDER_SITE_OTHER): Payer: Medicare HMO | Admitting: Physician Assistant

## 2018-06-21 VITALS — BP 156/84 | HR 76 | Temp 99.2°F | Wt 125.0 lb

## 2018-06-21 DIAGNOSIS — J069 Acute upper respiratory infection, unspecified: Secondary | ICD-10-CM

## 2018-06-21 DIAGNOSIS — B9789 Other viral agents as the cause of diseases classified elsewhere: Secondary | ICD-10-CM | POA: Diagnosis not present

## 2018-06-21 DIAGNOSIS — R52 Pain, unspecified: Secondary | ICD-10-CM

## 2018-06-21 LAB — POCT INFLUENZA A/B
Influenza A, POC: NEGATIVE
Influenza B, POC: NEGATIVE

## 2018-06-21 MED ORDER — LEVOTHYROXINE SODIUM 75 MCG PO TABS: 75.0000 ug | ORAL_TABLET | Freq: Every day | ORAL | 1 refills | Status: DC

## 2018-06-21 MED ORDER — BENZONATATE 100 MG PO CAPS: 100.0000 mg | ORAL_CAPSULE | Freq: Three times a day (TID) | ORAL | 0 refills | Status: AC | PRN

## 2018-06-21 NOTE — Progress Notes (Signed)
Patient: Hailey Cain Female    DOB: 10-Feb-1946   73 y.o.   MRN: [id number] Visit Date: 06/21/2018  Today's Provider: Trinna Post, PA-C   Chief Complaint  Patient presents with  . URI   Subjective:     HPI Upper Respiratory Infection: Patient complains of symptoms of a URI, possible sinusitis. Symptoms include congestion and cough. Onset of symptoms was 1 day ago, gradually worsening since that time. She also c/o congestion, headache described as back and top of head and nasal congestion for the past 1 day .  She is drinking plenty of fluids. Evaluation to date: none. Treatment to date: none.    No Known Allergies   Current Outpatient Medications:  .  alendronate (FOSAMAX) 70 MG tablet, Take 1 tablet (70 mg total) by mouth every 7 (seven) days. Take with a full glass of water on an empty stomach., Disp: 4 tablet, Rfl: 2 .  aspirin 81 MG tablet, Take 81 mg by mouth daily. , Disp: , Rfl:  .  Calcium Carbonate (CALCIUM 600 PO), Take by mouth., Disp: , Rfl:  .  Cholecalciferol 5000 units capsule, Take 5,000 Units by mouth daily. , Disp: , Rfl:  .  cyanocobalamin 100 MCG tablet, Take 100 mcg by mouth daily. , Disp: , Rfl:  .  FLUoxetine (PROZAC) 20 MG capsule, TAKE 2 CAPSULES (40 MG TOTAL) BY MOUTH ONCE DAILY, Disp: 180 capsule, Rfl: 0 .  levothyroxine (SYNTHROID, LEVOTHROID) 75 MCG tablet, TAKE 1 TABLET BY MOUTH ONCE DAILY, Disp: 90 tablet, Rfl: 1 .  loratadine (CLARITIN) 10 MG tablet, Take 1 tablet (10 mg total) by mouth daily. Reported on 05/17/2015, Disp: 90 tablet, Rfl: 1 .  montelukast (SINGULAIR) 10 MG tablet, Take 1 tablet (10 mg total) by mouth at bedtime. Reported on 05/17/2015, Disp: 90 tablet, Rfl: 1 .  Multiple Vitamins-Minerals (EMERGEN-C IMMUNE PO), Take 1 tablet by mouth daily. , Disp: , Rfl:  .  omeprazole (PRILOSEC) 20 MG capsule, Take 1 capsule (20 mg total) by mouth daily., Disp: 90 capsule, Rfl: 0 .  simvastatin (ZOCOR) 40 MG tablet, TAKE 1 TABLET BY MOUTH  ONCE DAILY, Disp: 90 tablet, Rfl: 1  Review of Systems  Social History   Tobacco Use  . Smoking status: Former Smoker    Packs/day: 1.00    Years: 20.00    Pack years: 20.00    Last attempt to quit: 05/12/2003    Years since quitting: 15.1  . Smokeless tobacco: Never Used  Substance Use Topics  . Alcohol use: No    Alcohol/week: 0.0 standard drinks      Objective:   BP (!) 156/84 (BP Location: Left Arm, Patient Position: Sitting, Cuff Size: Normal)   Pulse 76   Temp 99.2 F (37.3 C) (Oral)   Wt 125 lb (56.7 kg)   SpO2 95%   BMI 22.14 kg/m  Vitals:   06/21/18 1420  BP: (!) 156/84  Pulse: 76  Temp: 99.2 F (37.3 C)  TempSrc: Oral  SpO2: 95%  Weight: 125 lb (56.7 kg)     Physical Exam Constitutional:      Appearance: Normal appearance. She is well-developed. She is not ill-appearing.  HENT:     Right Ear: Tympanic membrane, ear canal and external ear normal.     Left Ear: Tympanic membrane, ear canal and external ear normal.     Mouth/Throat:     Mouth: Mucous membranes are moist.     Pharynx:  Oropharynx is clear. No oropharyngeal exudate.  Eyes:     General:        Right eye: Discharge present.        Left eye: Discharge present. Neck:     Musculoskeletal: Neck supple.  Cardiovascular:     Rate and Rhythm: Normal rate and regular rhythm.     Heart sounds: Normal heart sounds.  Pulmonary:     Effort: Pulmonary effort is normal. No respiratory distress.     Breath sounds: Normal breath sounds. No rales.  Lymphadenopathy:     Cervical: No cervical adenopathy.  Skin:    General: Skin is warm and dry.  Neurological:     Mental Status: She is alert and oriented to person, place, and time. Mental status is at baseline.  Psychiatric:        Mood and Affect: Mood normal.        Behavior: Behavior normal.         Assessment & Plan    1. Viral URI with cough  Rapid flu negative. Counseled regarding signs and symptoms of viral and bacterial respiratory  infections. Advised to call or return for additional evaluation if she develops any sign of bacterial infection, or if current symptoms last longer than 10 days.   - benzonatate (TESSALON PERLES) 100 MG capsule; Take 1 capsule (100 mg total) by mouth 3 (three) times daily as needed for up to 7 days.  Dispense: 21 capsule; Refill: 0 - POCT Influenza A/B  2. Body aches  - POCT Influenza A/B  The entirety of the information documented in the History of Present Illness, Review of Systems and Physical Exam were personally obtained by me. Portions of this information were initially documented by Lynford Humphrey, CMA and reviewed by me for thoroughness and accuracy.   Return if symptoms worsen or fail to improve.     Trinna Post, PA-C  [facility]

## 2018-06-21 NOTE — Patient Instructions (Signed)
Viral Respiratory Infection  A viral respiratory infection is an illness that affects parts of the body that are used for breathing. These include the lungs, nose, and throat. It is caused by a germ called a virus.  Some examples of this kind of infection are:  · A cold.  · The flu (influenza).  · A respiratory syncytial virus (RSV) infection.  A person who gets this illness may have the following symptoms:  · A stuffy or runny nose.  · Yellow or green fluid in the nose.  · A cough.  · Sneezing.  · Tiredness (fatigue).  · Achy muscles.  · A sore throat.  · Sweating or chills.  · A fever.  · A headache.  Follow these instructions at home:  Managing pain and congestion  · Take over-the-counter and prescription medicines only as told by your doctor.  · If you have a sore throat, gargle with salt water. Do this 3-4 times per day or as needed. To make a salt-water mixture, dissolve ½-1 tsp of salt in 1 cup of warm water. Make sure that all the salt dissolves.  · Use nose drops made from salt water. This helps with stuffiness (congestion). It also helps soften the skin around your nose.  · Drink enough fluid to keep your pee (urine) pale yellow.  General instructions    · Rest as much as possible.  · Do not drink alcohol.  · Do not use any products that have nicotine or tobacco, such as cigarettes and e-cigarettes. If you need help quitting, ask your doctor.  · Keep all follow-up visits as told by your doctor. This is important.  How is this prevented?    · Get a flu shot every year. Ask your doctor when you should get your flu shot.  · Do not let other people get your germs. If you are sick:  ? Stay home from work or school.  ? Wash your hands with soap and water often. Wash your hands after you cough or sneeze. If soap and water are not available, use hand sanitizer.  · Avoid contact with people who are sick during cold and flu season. This is in fall and winter.  Get help if:  · Your symptoms last for 10 days or  longer.  · Your symptoms get worse over time.  · You have a fever.  · You have very bad pain in your face or forehead.  · Parts of your jaw or neck become very swollen.  Get help right away if:  · You feel pain or pressure in your chest.  · You have shortness of breath.  · You faint or feel like you will faint.  · You keep throwing up (vomiting).  · You feel confused.  Summary  · A viral respiratory infection is an illness that affects parts of the body that are used for breathing.  · Examples of this illness include a cold, the flu, and respiratory syncytial virus (RSV) infection.  · The infection can cause a runny nose, cough, sneezing, sore throat, and fever.  · Follow what your doctor tells you about taking medicines, drinking lots of fluid, washing your hands, resting at home, and avoiding people who are sick.  This information is not intended to replace advice given to you by your health care provider. Make sure you discuss any questions you have with your health care provider.  Document Released: 04/09/2008 Document Revised: 06/07/2017 Document Reviewed: 06/07/2017  Elsevier   Interactive Patient Education © 2019 Elsevier Inc.

## 2018-06-27 DIAGNOSIS — R69 Illness, unspecified: Secondary | ICD-10-CM | POA: Diagnosis not present

## 2018-07-01 ENCOUNTER — Other Ambulatory Visit: Payer: Self-pay

## 2018-07-01 MED ORDER — OMEPRAZOLE 20 MG PO CPDR: 20.0000 mg | DELAYED_RELEASE_CAPSULE | Freq: Every day | ORAL | 0 refills | Status: DC

## 2018-07-25 DIAGNOSIS — R69 Illness, unspecified: Secondary | ICD-10-CM | POA: Diagnosis not present

## 2018-08-16 ENCOUNTER — Other Ambulatory Visit: Payer: Self-pay

## 2018-08-16 DIAGNOSIS — F32A Depression, unspecified: Secondary | ICD-10-CM

## 2018-08-16 DIAGNOSIS — M810 Age-related osteoporosis without current pathological fracture: Secondary | ICD-10-CM

## 2018-08-16 DIAGNOSIS — F329 Major depressive disorder, single episode, unspecified: Secondary | ICD-10-CM

## 2018-08-17 MED ORDER — ALENDRONATE SODIUM 70 MG PO TABS: 70.0000 mg | ORAL_TABLET | ORAL | 2 refills | Status: DC

## 2018-08-17 MED ORDER — FLUOXETINE HCL 20 MG PO CAPS: ORAL_CAPSULE | ORAL | 0 refills | Status: DC

## 2018-08-17 NOTE — Telephone Encounter (Signed)
Please Review

## 2018-09-13 DIAGNOSIS — D225 Melanocytic nevi of trunk: Secondary | ICD-10-CM | POA: Diagnosis not present

## 2018-09-13 DIAGNOSIS — L812 Freckles: Secondary | ICD-10-CM | POA: Diagnosis not present

## 2018-09-13 DIAGNOSIS — Z85828 Personal history of other malignant neoplasm of skin: Secondary | ICD-10-CM | POA: Diagnosis not present

## 2018-09-13 DIAGNOSIS — L821 Other seborrheic keratosis: Secondary | ICD-10-CM | POA: Diagnosis not present

## 2018-09-13 DIAGNOSIS — L820 Inflamed seborrheic keratosis: Secondary | ICD-10-CM | POA: Diagnosis not present

## 2018-09-14 ENCOUNTER — Other Ambulatory Visit: Payer: Self-pay | Admitting: Physician Assistant

## 2018-09-14 DIAGNOSIS — E7800 Pure hypercholesterolemia, unspecified: Secondary | ICD-10-CM

## 2018-09-16 ENCOUNTER — Other Ambulatory Visit: Payer: Self-pay | Admitting: *Deleted

## 2018-09-16 DIAGNOSIS — E7800 Pure hypercholesterolemia, unspecified: Secondary | ICD-10-CM

## 2018-09-19 MED ORDER — SIMVASTATIN 40 MG PO TABS: 40.0000 mg | ORAL_TABLET | Freq: Every day | ORAL | 1 refills | Status: DC

## 2018-10-04 ENCOUNTER — Ambulatory Visit: Payer: Medicare HMO

## 2018-10-05 ENCOUNTER — Other Ambulatory Visit: Payer: Self-pay | Admitting: Physician Assistant

## 2018-10-05 ENCOUNTER — Encounter: Payer: Self-pay | Admitting: Physician Assistant

## 2018-10-05 ENCOUNTER — Other Ambulatory Visit: Payer: Self-pay

## 2018-10-05 ENCOUNTER — Ambulatory Visit (INDEPENDENT_AMBULATORY_CARE_PROVIDER_SITE_OTHER): Payer: Medicare HMO

## 2018-10-05 ENCOUNTER — Ambulatory Visit (INDEPENDENT_AMBULATORY_CARE_PROVIDER_SITE_OTHER): Payer: Medicare HMO | Admitting: Physician Assistant

## 2018-10-05 VITALS — BP 112/62 | HR 65 | Temp 98.7°F | Ht 63.0 in | Wt 129.0 lb

## 2018-10-05 VITALS — BP 112/62 | HR 65 | Temp 98.7°F | Ht 63.0 in | Wt 129.8 lb

## 2018-10-05 DIAGNOSIS — K5900 Constipation, unspecified: Secondary | ICD-10-CM | POA: Diagnosis not present

## 2018-10-05 DIAGNOSIS — E039 Hypothyroidism, unspecified: Secondary | ICD-10-CM | POA: Diagnosis not present

## 2018-10-05 DIAGNOSIS — Z0000 Encounter for general adult medical examination without abnormal findings: Secondary | ICD-10-CM

## 2018-10-05 DIAGNOSIS — M810 Age-related osteoporosis without current pathological fracture: Secondary | ICD-10-CM

## 2018-10-05 DIAGNOSIS — R109 Unspecified abdominal pain: Secondary | ICD-10-CM | POA: Diagnosis not present

## 2018-10-05 DIAGNOSIS — R140 Abdominal distension (gaseous): Secondary | ICD-10-CM

## 2018-10-05 DIAGNOSIS — F32A Depression, unspecified: Secondary | ICD-10-CM

## 2018-10-05 DIAGNOSIS — R7303 Prediabetes: Secondary | ICD-10-CM

## 2018-10-05 DIAGNOSIS — F329 Major depressive disorder, single episode, unspecified: Secondary | ICD-10-CM

## 2018-10-05 DIAGNOSIS — J309 Allergic rhinitis, unspecified: Secondary | ICD-10-CM

## 2018-10-05 DIAGNOSIS — R69 Illness, unspecified: Secondary | ICD-10-CM | POA: Diagnosis not present

## 2018-10-05 DIAGNOSIS — I6381 Other cerebral infarction due to occlusion or stenosis of small artery: Secondary | ICD-10-CM | POA: Diagnosis not present

## 2018-10-05 MED ORDER — ALENDRONATE SODIUM 70 MG PO TABS: 70.0000 mg | ORAL_TABLET | ORAL | 1 refills | Status: DC

## 2018-10-05 MED ORDER — MONTELUKAST SODIUM 10 MG PO TABS: 10.0000 mg | ORAL_TABLET | Freq: Every day | ORAL | 1 refills | Status: DC

## 2018-10-05 MED ORDER — LEVOTHYROXINE SODIUM 75 MCG PO TABS: 75.0000 ug | ORAL_TABLET | Freq: Every day | ORAL | 1 refills | Status: DC

## 2018-10-05 MED ORDER — FLUOXETINE HCL 20 MG PO CAPS: ORAL_CAPSULE | ORAL | 1 refills | Status: DC

## 2018-10-05 NOTE — Progress Notes (Signed)
Patient: Hailey Cain, Female    DOB: 10-12-1945, 73 y.o.   MRN: [id number] Visit Date: 10/06/2018  Today's Provider: Trinna Post, PA-C   Chief Complaint  Patient presents with  . Follow-up   Subjective:     Complete Physical Hailey Cain is a 73 y.o. female. She feels well. She reports exercising daily by walking. She reports she is sleeping well.  -----------------------------------------------------------  Prediabetes, Follow-up:   Lab Results  Component Value Date   HGBA1C 5.6 10/05/2018   HGBA1C 5.8 (H) 03/29/2018   HGBA1C 5.8 (H) 09/09/2017   GLUCOSE 87 10/05/2018   GLUCOSE 82 03/29/2018   GLUCOSE 112 (H) 02/03/2017    Last seen for for this6 months ago.  Management since that visit includes none. Current symptoms include none and have been unchanged.  Weight trend: stable Prior visit with dietician: no Current diet: well balanced Current exercise: walking  Pertinent Labs:    Component Value Date/Time   CHOL 186 03/29/2018 0955   TRIG 122 03/29/2018 0955   CHOLHDL 2.7 03/29/2018 0955   CREATININE 0.76 10/05/2018 1020    Wt Readings from Last 3 Encounters:  10/05/18 129 lb (58.5 kg)  10/05/18 129 lb 12.8 oz (58.9 kg)  06/21/18 125 lb (56.7 kg)    Hypothyroid, follow-up:  TSH  Date Value Ref Range Status  10/05/2018 7.510 (H) 0.450 - 4.500 uIU/mL Final  09/09/2017 4.240 0.450 - 4.500 uIU/mL Final  08/11/2016 2.760 0.450 - 4.500 uIU/mL Final   Wt Readings from Last 3 Encounters:  10/05/18 129 lb (58.5 kg)  10/05/18 129 lb 12.8 oz (58.9 kg)  06/21/18 125 lb (56.7 kg)    She was last seen for hypothyroid 6 months ago.  Management since that visit includes none, continue Synthroid 72mcg. She reports excellent compliance with treatment. She is not having side effects.  She is exercising. She is experiencing change in energy level She denies diarrhea, heat / cold intolerance, nervousness, palpitations and weight  changes Weight trend: stable  ------------------------------------------------------------------------  Depression, Follow-up  She  was last seen for this 6 months ago. Changes made at last visit include none.  , patient continued Prozac 20mg  She reports excellent compliance with treatment. She is not having side effects.   She reports good tolerance of treatment. Current symptoms include: none She feels she is Unchanged since last visit.  ------------------------------------------------------------------------    Follow up for Osteoporosis  The patient was last seen for this 6 months ago. Changes made at last visit include none .  She reports excellent compliance with treatment. She feels that condition is Unchanged. She is not having side effects.   Reports continued abdominal pain and bloating. Better in the morning and worse at night. Had SBO two years ago. No fevers, weight loss, night sweats. ------------------------------------------------------------------------------------   Review of Systems  Constitutional: Negative.   HENT: Negative.   Eyes: Negative.   Respiratory: Negative.   Cardiovascular: Negative.   Gastrointestinal: Positive for abdominal distention, abdominal pain and constipation.  Endocrine: Negative.  Negative for cold intolerance.  Genitourinary: Negative.   Musculoskeletal: Positive for arthralgias.  Skin: Negative.   Allergic/Immunologic: Negative.   Neurological: Positive for numbness.  Hematological: Negative.   Psychiatric/Behavioral: Negative.     Social History   Socioeconomic History  . Marital status: Widowed    Spouse name: Hailey Cain  . Number of children: 1  . Years of education: H/S  . Highest education level: 12th grade  Occupational History  . Occupation: retired  Scientific laboratory technician  . Financial resource strain: Not hard at all  . Food insecurity:    Worry: Never true    Inability: Never true  . Transportation  needs:    Medical: No    Non-medical: No  Tobacco Use  . Smoking status: Former Smoker    Packs/day: 1.00    Years: 20.00    Pack years: 20.00    Last attempt to quit: 05/12/2003    Years since quitting: 15.4  . Smokeless tobacco: Never Used  Substance and Sexual Activity  . Alcohol use: No    Alcohol/week: 0.0 standard drinks  . Drug use: No  . Sexual activity: Not on file  Lifestyle  . Physical activity:    Days per week: 2 days    Minutes per session: 20 min  . Stress: Not at all  Relationships  . Social connections:    Talks on phone: Patient refused    Gets together: Patient refused    Attends religious service: Patient refused    Active member of club or organization: Patient refused    Attends meetings of clubs or organizations: Patient refused    Relationship status: Patient refused  . Intimate partner violence:    Fear of current or ex partner: Patient refused    Emotionally abused: Patient refused    Physically abused: Patient refused    Forced sexual activity: Patient refused  Other Topics Concern  . Not on file  Social History Narrative  . Not on file    Past Medical History:  Diagnosis Date  . Acid reflux 12/30/2006  . Adult hypothyroidism 01/13/2007  . Allergic rhinitis 09/07/2014  . Anxiety 12/10/2014  . Bronchitis 05/17/2015  . Cancer of cheek ([place]) 07/04/2007  . Depression, neurotic 12/30/2006  . Hyperlipidemia   . Prediabetes 08/11/2016  . RAD (reactive airway disease) 09/07/2014  . Small bowel obstruction ([place]) 01/31/2017     Patient Active Problem List   Diagnosis Date Noted  . History of small bowel obstruction 01/31/2017  . Prediabetes 08/11/2016  . Anxiety 12/10/2014  . Osteoporosis 11/06/2014  . Allergic rhinitis 09/07/2014  . Blood glucose elevated 09/07/2014  . Lacunar infarction ([place]) 09/07/2014  . RAD (reactive airway disease) 09/07/2014  . Cancer of cheek ([place]) 07/04/2007  . Adult hypothyroidism 01/13/2007  . Narrowing of intervertebral  disc space 12/30/2006  . Depression, neurotic 12/30/2006  . GERD (gastroesophageal reflux disease) 12/30/2006  . Fam hx-ischem heart disease 12/30/2006  . History of tobacco use 12/30/2006  . Hypercholesterolemia without hypertriglyceridemia 12/30/2006    Past Surgical History:  Procedure Laterality Date  . ABDOMINAL HYSTERECTOMY  1988   heavy, irregular menses; ovaries intact  . AUGMENTATION MAMMAPLASTY Bilateral    silicone  . BREAST SURGERY  1970   augmentation  . EYE SURGERY     cataract extraction  . EYE SURGERY  2007   LASIK  . TONSILLECTOMY AND ADENOIDECTOMY  1950    Her family history includes CAD in her brother and mother; Cancer in her brother; Heart Problems in her brother; Heart attack in her father; Hyperthyroidism in her sister; Stroke in her mother.   Current Outpatient Medications:  .  alendronate (FOSAMAX) 70 MG tablet, Take 1 tablet (70 mg total) by mouth every 7 (seven) days. Take with a full glass of water on an empty stomach., Disp: 12 tablet, Rfl: 1 .  aspirin 81 MG tablet, Take 81 mg by mouth daily. , Disp: ,  Rfl:  .  Calcium Carbonate (CALCIUM 600 PO), Take by mouth daily. , Disp: , Rfl:  .  Cholecalciferol 5000 units capsule, Take 5,000 Units by mouth daily. , Disp: , Rfl:  .  cyanocobalamin 100 MCG tablet, Take 100 mcg by mouth daily. , Disp: , Rfl:  .  FLUoxetine (PROZAC) 20 MG capsule, TAKE 2 CAPSULES (40 MG TOTAL) BY MOUTH ONCE DAILY, Disp: 180 capsule, Rfl: 1 .  loratadine (CLARITIN) 10 MG tablet, Take 1 tablet (10 mg total) by mouth daily. Reported on 05/17/2015, Disp: 90 tablet, Rfl: 1 .  montelukast (SINGULAIR) 10 MG tablet, Take 1 tablet (10 mg total) by mouth at bedtime. Reported on 05/17/2015, Disp: 90 tablet, Rfl: 1 .  Multiple Vitamins-Minerals (EMERGEN-C IMMUNE PO), Take 1 tablet by mouth as needed. , Disp: , Rfl:  .  omeprazole (PRILOSEC) 20 MG capsule, Take 1 capsule (20 mg total) by mouth daily., Disp: 90 capsule, Rfl: 0 .  simvastatin  (ZOCOR) 40 MG tablet, Take 1 tablet (40 mg total) by mouth daily., Disp: 90 tablet, Rfl: 1 .  levothyroxine (SYNTHROID) 88 MCG tablet, Take 1 tablet (88 mcg total) by mouth daily before breakfast., Disp: 90 tablet, Rfl: 0  Patient Care Team: Paulene Floor as PCP - General (Physician Assistant)     Objective:    Vitals: BP 112/62   Pulse 65   Temp 98.7 F (37.1 C) (Oral)   Ht 5\' 3"  (1.6 m)   Wt 129 lb (58.5 kg)   BMI 22.85 kg/m   Physical Exam Constitutional:      Appearance: Normal appearance.  Neck:     Thyroid: No thyromegaly.  Cardiovascular:     Rate and Rhythm: Normal rate and regular rhythm.     Heart sounds: Normal heart sounds.  Pulmonary:     Effort: Pulmonary effort is normal.     Breath sounds: Normal breath sounds.  Skin:    General: Skin is warm and dry.  Neurological:     Mental Status: She is alert and oriented to person, place, and time. Mental status is at baseline.  Psychiatric:        Mood and Affect: Mood normal.        Behavior: Behavior normal.     Activities of Daily Living In your present state of health, do you have any difficulty performing the following activities: 10/05/2018  Hearing? Y  Comment Wear bilateral hearing aids.   Vision? N  Difficulty concentrating or making decisions? N  Walking or climbing stairs? N  Dressing or bathing? N  Doing errands, shopping? N  Preparing Food and eating ? N  Using the Toilet? N  Do you have problems with loss of bowel control? N  Managing your Medications? N  Managing your Finances? N  Housekeeping or managing your Housekeeping? N  Some recent data might be hidden    Fall Risk Assessment Fall Risk  10/05/2018 09/28/2017 09/09/2017 08/11/2016 07/22/2015  Falls in the past year? 0 Yes No Yes Yes  Number falls in past yr: - 1 - 1 1  Comment - - - tripped  -  Injury with Fall? - No - No No  Follow up - Falls prevention discussed - Falls prevention discussed -     Depression Screen PHQ  2/9 Scores 10/05/2018 03/29/2018 09/28/2017 09/09/2017  PHQ - 2 Score 0 0 2 0  PHQ- 9 Score - 0 7 10    6CIT Screen 08/11/2016  What Year? 0  points  What month? 0 points  What time? 0 points  Count back from 20 0 points  Months in reverse 0 points  Repeat phrase 4 points  Total Score 4       Assessment & Plan:    Annual Physical Reviewed patient's Family Medical History Reviewed and updated list of patient's medical providers Assessment of cognitive impairment was done Assessed patient's functional ability Established a written schedule for health screening [place] Completed and Reviewed  Exercise Activities and Dietary recommendations Goals    . Exercise      Recommend starting back exercising at the gym 3 days a week for 1 hour.     . Exercise 3x per week (30 min per time)     Recommend to start back exercising 3 days a week for 30 minutes at a time. (Pt to start a silver sneakers class)       Immunization History  Administered Date(s) Administered  . Influenza, High Dose Seasonal PF 02/13/2015, 02/08/2017, 02/04/2018  . Pneumococcal Conjugate-13 02/13/2015  . Pneumococcal Polysaccharide-23 05/01/2017  . Td 10/16/2010, 02/28/2018  . Tdap 10/16/2010, 02/28/2018  . Zoster 10/27/2014    Health Maintenance  Topic Date Due  . INFLUENZA VACCINE  12/10/2018  . MAMMOGRAM  11/18/2019  . DEXA SCAN  12/01/2019  . COLONOSCOPY  05/27/2022  . TETANUS/TDAP  02/29/2028  . Hepatitis C Screening  Completed  . PNA vac Low Risk Adult  Completed     Discussed health benefits of physical activity, and encouraged her to engage in regular exercise appropriate for her age and condition.    1. Annual physical exam   2. Abdominal pain, unspecified abdominal location  - Ambulatory referral to Gastroenterology  3. Abdominal bloating  - Ambulatory referral to Gastroenterology  4. Constipation, unspecified constipation type  - Ambulatory referral to  Gastroenterology  5. Prediabetes  - HgB A1c - Comprehensive Metabolic Panel (CMET)  6. Lacunar infarction ([place])   7. Adult hypothyroidism  Low TSH, increase dosage to 88 mcg daily. - TSH  8. Age-related osteoporosis without current pathological fracture  - alendronate (FOSAMAX) 70 MG tablet; Take 1 tablet (70 mg total) by mouth every 7 (seven) days. Take with a full glass of water on an empty stomach.  Dispense: 12 tablet; Refill: 1  9. Depression, unspecified depression type  - FLUoxetine (PROZAC) 20 MG capsule; TAKE 2 CAPSULES (40 MG TOTAL) BY MOUTH ONCE DAILY  Dispense: 180 capsule; Refill: 1  10. Hypothyroidism, unspecified type   11. Allergic rhinitis  - montelukast (SINGULAIR) 10 MG tablet; Take 1 tablet (10 mg total) by mouth at bedtime. Reported on 05/17/2015  Dispense: 90 tablet; Refill: 1  The entirety of the information documented in the History of Present Illness, Review of Systems and Physical Exam were personally obtained by me. Portions of this information were initially documented by Jennings Books, CMA and reviewed by me for thoroughness and accuracy.   F/u 6 months   ------------------------------------------------------------------------------------------------------------    Trinna Post, PA-C  [facility]

## 2018-10-05 NOTE — Progress Notes (Signed)
Subjective:   Hailey Cain is a 73 y.o. female who presents for Medicare Annual (Subsequent) preventive examination.  Review of Systems:  N/A   Cardiac Risk Factors include: advanced age (>58men, >64 women);dyslipidemia     Objective:     Vitals: BP 112/62 (BP Location: Right Arm)   Pulse 65   Temp 98.7 F (37.1 C) (Oral)   Ht 5\' 3"  (1.6 m)   Wt 129 lb 12.8 oz (58.9 kg)   BMI 22.99 kg/m   Body mass index is 22.99 kg/m.  Advanced Directives 10/05/2018 09/28/2017 01/31/2017 01/31/2017 08/11/2016 07/22/2015 05/17/2015  Does Patient Have a Medical Advance Directive? Yes Yes No No Yes No No  Type of Paramedic of [place];Living will [place] - -  Does patient want to make changes to medical advance directive? - - - - Yes (ED - Information included in AVS) - -  Copy of [place] in Chart? No - copy requested No - copy requested - - No - copy requested - -  Would patient like information on creating a medical advance directive? - - No - Patient declined - - - -    Tobacco Social History   Tobacco Use  Smoking Status Former Smoker  . Packs/day: 1.00  . Years: 20.00  . Pack years: 20.00  . Last attempt to quit: 05/12/2003  . Years since quitting: 15.4  Smokeless Tobacco Never Used     Counseling given: Not Answered   Clinical Intake:  Pre-visit preparation completed: Yes  Pain : No/denies pain Pain Score: 0-No pain     Nutritional Status: BMI of 19-24  Normal Nutritional Risks: None Diabetes: No  How often do you need to have someone help you when you read instructions, pamphlets, or other written materials from your doctor or pharmacy?: 1 - Never  Interpreter Needed?: No  Information entered by :: [facility], LPN  Past Medical History:  Diagnosis Date  . Acid reflux 12/30/2006  . Adult hypothyroidism 01/13/2007  . Allergic rhinitis 09/07/2014  . Anxiety 12/10/2014  .  Bronchitis 05/17/2015  . Cancer of cheek ([place]) 07/04/2007  . Depression, neurotic 12/30/2006  . Hyperlipidemia   . Prediabetes 08/11/2016  . RAD (reactive airway disease) 09/07/2014  . Small bowel obstruction ([place]) 01/31/2017   Past Surgical History:  Procedure Laterality Date  . ABDOMINAL HYSTERECTOMY  1988   heavy, irregular menses; ovaries intact  . AUGMENTATION MAMMAPLASTY Bilateral    silicone  . BREAST SURGERY  1970   augmentation  . EYE SURGERY     cataract extraction  . EYE SURGERY  2007   LASIK  . TONSILLECTOMY AND ADENOIDECTOMY  1950   Family History  Problem Relation Age of Onset  . Stroke Mother   . CAD Mother   . Heart attack Father   . Hyperthyroidism Sister   . Heart Problems Brother   . Cancer Brother        skin  . CAD Brother        CABG   Social History   Socioeconomic History  . Marital status: Widowed    Spouse name: Laban Emperor  . Number of children: 1  . Years of education: H/S  . Highest education level: 12th grade  Occupational History  . Occupation: retired  Scientific laboratory technician  . Financial resource strain: Not hard at all  . Food insecurity:    Worry: Never true  Inability: Never true  . Transportation needs:    Medical: No    Non-medical: No  Tobacco Use  . Smoking status: Former Smoker    Packs/day: 1.00    Years: 20.00    Pack years: 20.00    Last attempt to quit: 05/12/2003    Years since quitting: 15.4  . Smokeless tobacco: Never Used  Substance and Sexual Activity  . Alcohol use: No    Alcohol/week: 0.0 standard drinks  . Drug use: No  . Sexual activity: Not on file  Lifestyle  . Physical activity:    Days per week: 2 days    Minutes per session: 20 min  . Stress: Not at all  Relationships  . Social connections:    Talks on phone: Patient refused    Gets together: Patient refused    Attends religious service: Patient refused    Active member of club or organization: Patient refused    Attends meetings of clubs or  organizations: Patient refused    Relationship status: Patient refused  Other Topics Concern  . Not on file  Social History Narrative  . Not on file    Outpatient Encounter Medications as of 10/05/2018  Medication Sig  . alendronate (FOSAMAX) 70 MG tablet Take 1 tablet (70 mg total) by mouth every 7 (seven) days. Take with a full glass of water on an empty stomach.  Marland Kitchen aspirin 81 MG tablet Take 81 mg by mouth daily.   . Calcium Carbonate (CALCIUM 600 PO) Take by mouth daily.   . Cholecalciferol 5000 units capsule Take 5,000 Units by mouth daily.   . cyanocobalamin 100 MCG tablet Take 100 mcg by mouth daily.   Marland Kitchen FLUoxetine (PROZAC) 20 MG capsule TAKE 2 CAPSULES (40 MG TOTAL) BY MOUTH ONCE DAILY  . levothyroxine (SYNTHROID, LEVOTHROID) 75 MCG tablet Take 1 tablet (75 mcg total) by mouth daily.  Marland Kitchen loratadine (CLARITIN) 10 MG tablet Take 1 tablet (10 mg total) by mouth daily. Reported on 05/17/2015  . montelukast (SINGULAIR) 10 MG tablet Take 1 tablet (10 mg total) by mouth at bedtime. Reported on 05/17/2015  . Multiple Vitamins-Minerals (EMERGEN-C IMMUNE PO) Take 1 tablet by mouth as needed.   Marland Kitchen omeprazole (PRILOSEC) 20 MG capsule Take 1 capsule (20 mg total) by mouth daily.  . simvastatin (ZOCOR) 40 MG tablet Take 1 tablet (40 mg total) by mouth daily.   No facility-administered encounter medications on file as of 10/05/2018.     Activities of Daily Living In your present state of health, do you have any difficulty performing the following activities: 10/05/2018  Hearing? Y  Comment Wear bilateral hearing aids.   Vision? N  Difficulty concentrating or making decisions? N  Walking or climbing stairs? N  Dressing or bathing? N  Doing errands, shopping? N  Preparing Food and eating ? N  Using the Toilet? N  Do you have problems with loss of bowel control? N  Managing your Medications? N  Managing your Finances? N  Housekeeping or managing your Housekeeping? N  Some recent data might be  hidden    Patient Care Team: Paulene Floor as PCP - General (Physician Assistant)    Assessment:   This is a routine wellness examination for [place].  Exercise Activities and Dietary recommendations Current Exercise Habits: Home exercise routine, Type of exercise: Other - see comments;strength training/weights(jog), Time (Minutes): 20, Frequency (Times/Week): 2, Weekly Exercise (Minutes/Week): 40, Intensity: Mild  Goals    . Exercise  Recommend starting back exercising at the gym 3 days a week for 1 hour.     . Exercise 3x per week (30 min per time)     Recommend to start back exercising 3 days a week for 30 minutes at a time. (Pt to start a silver sneakers class)       Fall Risk: Fall Risk  10/05/2018 09/28/2017 09/09/2017 08/11/2016 07/22/2015  Falls in the past year? 0 Yes No Yes Yes  Number falls in past yr: - 1 - 1 1  Comment - - - tripped  -  Injury with Fall? - No - No No  Follow up - Falls prevention discussed - Falls prevention discussed -    FALL RISK PREVENTION PERTAINING TO THE HOME:  Any stairs in or around the home? Yes  If so, are there any without handrails? Yes   Home free of loose throw rugs in walkways, pet beds, electrical cords, etc? Yes  Adequate lighting in your home to reduce risk of falls? Yes   ASSISTIVE DEVICES UTILIZED TO PREVENT FALLS:  Life alert? No  Use of a cane, walker or w/c? No  Grab bars in the bathroom? No  Shower chair or bench in shower? No  Elevated toilet seat or a handicapped toilet? No    TIMED UP AND GO:  Was the test performed? No .    Depression Screen PHQ 2/9 Scores 10/05/2018 03/29/2018 09/28/2017 09/09/2017  PHQ - 2 Score 0 0 2 0  PHQ- 9 Score - 0 7 10     Cognitive Function: Declined today.      6CIT Screen 08/11/2016  What Year? 0 points  What month? 0 points  What time? 0 points  Count back from 20 0 points  Months in reverse 0 points  Repeat phrase 4 points  Total Score 4    Immunization  History  Administered Date(s) Administered  . Influenza, High Dose Seasonal PF 02/13/2015, 02/08/2017, 02/04/2018  . Pneumococcal Conjugate-13 02/13/2015  . Pneumococcal Polysaccharide-23 05/01/2017  . Td 10/16/2010, 02/28/2018  . Tdap 10/16/2010, 02/28/2018  . Zoster 10/27/2014    Qualifies for Shingles Vaccine? Yes  Zostavax completed 10/27/14. Due for Shingrix. Education has been provided regarding the importance of this vaccine. Pt has been advised to call insurance company to determine out of pocket expense. Advised may also receive vaccine at local pharmacy or Health Dept. Verbalized acceptance and understanding.  Tdap: Up to date  Flu Vaccine: Up to date  Pneumococcal Vaccine: Up to date  Screening Tests Health Maintenance  Topic Date Due  . INFLUENZA VACCINE  12/10/2018  . MAMMOGRAM  11/18/2019  . DEXA SCAN  12/01/2019  . COLONOSCOPY  05/27/2022  . TETANUS/TDAP  02/29/2028  . Hepatitis C Screening  Completed  . PNA vac Low Risk Adult  Completed    Cancer Screenings:  Colorectal Screening: Completed 05/27/12. Repeat every 10 years.  Mammogram: Completed 11/17/17.   Bone Density: Completed 11/30/17. Results reflect OSTEOPOROSIS. Repeat every 2 years. .  Lung Cancer Screening: (Low Dose CT Chest recommended if Age 61-80 years, 30 pack-year currently smoking OR have quit w/in 15years.) does not qualify.   Additional Screening:  Hepatitis C Screening: Up to date  Vision Screening: Recommended annual ophthalmology exams for early detection of glaucoma and other disorders of the eye.  Dental Screening: Recommended annual dental exams for proper oral hygiene  Community Resource Referral:  CRR required this visit?  No       Plan:  I have personally reviewed and addressed the Medicare Annual Wellness questionnaire and have noted the following in the patient's chart:  A. Medical and social history B. Use of alcohol, tobacco or illicit drugs  C. Current  medications and supplements D. Functional ability and status E.  Nutritional status F.  Physical activity G. Advance directives H. List of other physicians I.  Hospitalizations, surgeries, and ER visits in previous 12 months J.  [place] such as hearing and vision if needed, cognitive and depression L. Referrals and appointments   In addition, I have reviewed and discussed with patient certain preventive protocols, quality metrics, and best practice recommendations. A written personalized care plan for preventive services as well as general preventive health recommendations were provided to patient. Nurse Health Advisor  Signed,    Porscha Axley [place], [place]  [phone] Nurse Health Advisor   Nurse Notes: None.

## 2018-10-05 NOTE — Patient Instructions (Addendum)
Hailey Cain , Thank you for taking time to come for your Medicare Wellness Visit. I appreciate your ongoing commitment to your health goals. Please review the following plan we discussed and let me know if I can assist you in the future.   Screening recommendations/referrals: Colonoscopy: Up to date, due 05/2022 Mammogram: Up to date, due 11/2019 Bone Density: Up to date, due 11/2019 Recommended yearly ophthalmology/optometry visit for glaucoma screening and checkup Recommended yearly dental visit for hygiene and checkup  Vaccinations: Influenza vaccine: Up to date Pneumococcal vaccine: Completed series Tdap vaccine: Up to date, due 02/2028 Shingles vaccine: Pt declines today.     Advanced directives: Please bring a copy of your POA (Power of Attorney) and/or Living Will to your next appointment.   Conditions/risks identified: Recommend to continue to exercise and increase to 3 days a week for at least 30 minutes at a time.   Next appointment: 9:40 AM today with Carles Collet.   Preventive Care 73 Years and Older, Female Preventive care refers to lifestyle choices and visits with your health care provider that can promote health and wellness. What does preventive care include?  A yearly physical exam. This is also called an annual well check.  Dental exams once or twice a year.  Routine eye exams. Ask your health care provider how often you should have your eyes checked.  Personal lifestyle choices, including:  Daily care of your teeth and gums.  Regular physical activity.  Eating a healthy diet.  Avoiding tobacco and drug use.  Limiting alcohol use.  Practicing safe sex.  Taking low-dose aspirin every day.  Taking vitamin and mineral supplements as recommended by your health care provider. What happens during an annual well check? The services and screenings done by your health care provider during your annual well check will depend on your age, overall health,  lifestyle risk factors, and family history of disease. Counseling  Your health care provider may ask you questions about your:  Alcohol use.  Tobacco use.  Drug use.  Emotional well-being.  Home and relationship well-being.  Sexual activity.  Eating habits.  History of falls.  Memory and ability to understand (cognition).  Work and work Statistician.  Reproductive health. Screening  You may have the following tests or measurements:  Height, weight, and BMI.  Blood pressure.  Lipid and cholesterol levels. These may be checked every 5 years, or more frequently if you are over 69 years old.  Skin check.  Lung cancer screening. You may have this screening every year starting at age 35 if you have a 30-pack-year history of smoking and currently smoke or have quit within the past 15 years.  Fecal occult blood test (FOBT) of the stool. You may have this test every year starting at age 22.  Flexible sigmoidoscopy or colonoscopy. You may have a sigmoidoscopy every 5 years or a colonoscopy every 10 years starting at age 18.  Hepatitis C blood test.  Hepatitis B blood test.  Sexually transmitted disease (STD) testing.  Diabetes screening. This is done by checking your blood sugar (glucose) after you have not eaten for a while (fasting). You may have this done every 1-3 years.  Bone density scan. This is done to screen for osteoporosis. You may have this done starting at age 107.  Mammogram. This may be done every 1-2 years. Talk to your health care provider about how often you should have regular mammograms. Talk with your health care provider about your test results, treatment  options, and if necessary, the need for more tests. Vaccines  Your health care provider may recommend certain vaccines, such as:  Influenza vaccine. This is recommended every year.  Tetanus, diphtheria, and acellular pertussis (Tdap, Td) vaccine. You may need a Td booster every 10 years.  Zoster  vaccine. You may need this after age 70.  Pneumococcal 13-valent conjugate (PCV13) vaccine. One dose is recommended after age 53.  Pneumococcal polysaccharide (PPSV23) vaccine. One dose is recommended after age 34. Talk to your health care provider about which screenings and vaccines you need and how often you need them. This information is not intended to replace advice given to you by your health care provider. Make sure you discuss any questions you have with your health care provider. Document Released: 05/24/2015 Document Revised: 01/15/2016 Document Reviewed: 02/26/2015 Elsevier Interactive Patient Education  2017 [employer] Prevention in the Home Falls can cause injuries. They can happen to people of all ages. There are many things you can do to make your home safe and to help prevent falls. What can I do on the outside of my home?  Regularly fix the edges of walkways and driveways and fix any cracks.  Remove anything that might make you trip as you walk through a door, such as a raised step or threshold.  Trim any bushes or trees on the path to your home.  Use bright outdoor lighting.  Clear any walking paths of anything that might make someone trip, such as rocks or tools.  Regularly check to see if handrails are loose or broken. Make sure that both sides of any steps have handrails.  Any raised decks and porches should have guardrails on the edges.  Have any leaves, snow, or ice cleared regularly.  Use sand or salt on walking paths during winter.  Clean up any spills in your garage right away. This includes oil or grease spills. What can I do in the bathroom?  Use night lights.  Install grab bars by the toilet and in the tub and shower. Do not use towel bars as grab bars.  Use non-skid mats or decals in the tub or shower.  If you need to sit down in the shower, use a plastic, non-slip stool.  Keep the floor dry. Clean up any water that spills on the  floor as soon as it happens.  Remove soap buildup in the tub or shower regularly.  Attach bath mats securely with double-sided non-slip rug tape.  Do not have throw rugs and other things on the floor that can make you trip. What can I do in the bedroom?  Use night lights.  Make sure that you have a light by your bed that is easy to reach.  Do not use any sheets or blankets that are too big for your bed. They should not hang down onto the floor.  Have a firm chair that has side arms. You can use this for support while you get dressed.  Do not have throw rugs and other things on the floor that can make you trip. What can I do in the kitchen?  Clean up any spills right away.  Avoid walking on wet floors.  Keep items that you use a lot in easy-to-reach places.  If you need to reach something above you, use a strong step stool that has a grab bar.  Keep electrical cords out of the way.  Do not use floor polish or wax that makes floors slippery.  If you must use wax, use non-skid floor wax.  Do not have throw rugs and other things on the floor that can make you trip. What can I do with my stairs?  Do not leave any items on the stairs.  Make sure that there are handrails on both sides of the stairs and use them. Fix handrails that are broken or loose. Make sure that handrails are as long as the stairways.  Check any carpeting to make sure that it is firmly attached to the stairs. Fix any carpet that is loose or worn.  Avoid having throw rugs at the top or bottom of the stairs. If you do have throw rugs, attach them to the floor with carpet tape.  Make sure that you have a light switch at the top of the stairs and the bottom of the stairs. If you do not have them, ask someone to add them for you. What else can I do to help prevent falls?  Wear shoes that:  Do not have high heels.  Have rubber bottoms.  Are comfortable and fit you well.  Are closed at the toe. Do not wear  sandals.  If you use a stepladder:  Make sure that it is fully opened. Do not climb a closed stepladder.  Make sure that both sides of the stepladder are locked into place.  Ask someone to hold it for you, if possible.  Clearly mark and make sure that you can see:  Any grab bars or handrails.  First and last steps.  Where the edge of each step is.  Use tools that help you move around (mobility aids) if they are needed. These include:  Canes.  Walkers.  Scooters.  Crutches.  Turn on the lights when you go into a dark area. Replace any light bulbs as soon as they burn out.  Set up your furniture so you have a clear path. Avoid moving your furniture around.  If any of your floors are uneven, fix them.  If there are any pets around you, be aware of where they are.  Review your medicines with your doctor. Some medicines can make you feel dizzy. This can increase your chance of falling. Ask your doctor what other things that you can do to help prevent falls. This information is not intended to replace advice given to you by your health care provider. Make sure you discuss any questions you have with your health care provider. Document Released: 02/21/2009 Document Revised: 10/03/2015 Document Reviewed: 06/01/2014 Elsevier Interactive Patient Education  2017 [employer].

## 2018-10-06 ENCOUNTER — Telehealth: Payer: Self-pay

## 2018-10-06 DIAGNOSIS — E039 Hypothyroidism, unspecified: Secondary | ICD-10-CM

## 2018-10-06 LAB — HEMOGLOBIN A1C
Est. average glucose Bld gHb Est-mCnc: 114 mg/dL
Hgb A1c MFr Bld: 5.6 % (ref 4.8–5.6)

## 2018-10-06 LAB — COMPREHENSIVE METABOLIC PANEL
ALT: 11 IU/L (ref 0–32)
AST: 16 IU/L (ref 0–40)
Albumin/Globulin Ratio: 1.8 (ref 1.2–2.2)
Albumin: 3.9 g/dL (ref 3.7–4.7)
Alkaline Phosphatase: 36 IU/L — ABNORMAL LOW (ref 39–117)
BUN/Creatinine Ratio: 16 (ref 12–28)
BUN: 12 mg/dL (ref 8–27)
Bilirubin Total: 0.2 mg/dL (ref 0.0–1.2)
CO2: 25 mmol/L (ref 20–29)
Calcium: 9.3 mg/dL (ref 8.7–10.3)
Chloride: 102 mmol/L (ref 96–106)
Creatinine, Ser: 0.76 mg/dL (ref 0.57–1.00)
GFR calc Af Amer: 91 mL/min/{1.73_m2} (ref >59)
GFR calc non Af Amer: 79 mL/min/{1.73_m2} (ref >59)
Globulin, Total: 2.2 g/dL (ref 1.5–4.5)
Glucose: 87 mg/dL (ref 65–99)
Potassium: 4.3 mmol/L (ref 3.5–5.2)
Sodium: 141 mmol/L (ref 134–144)
Total Protein: 6.1 g/dL (ref 6.0–8.5)

## 2018-10-06 LAB — TSH: TSH: 7.51 u[IU]/mL — ABNORMAL HIGH (ref 0.450–4.500)

## 2018-10-06 MED ORDER — LEVOTHYROXINE SODIUM 88 MCG PO TABS: 88.0000 ug | ORAL_TABLET | Freq: Every day | ORAL | 0 refills | Status: DC

## 2018-10-06 NOTE — Telephone Encounter (Signed)
[id number] 

## 2018-10-06 NOTE — Telephone Encounter (Signed)
-----   Message from Trinna Post, [place] sent at 10/06/2018  8:45 AM EDT ----- Labs normal except for TSH which is high, shows an underactive thyroid. I would recommend going up to the next dose of synthroid. Can we schedule her e-visit/telephone in 6 weeks to follow up? I will send in new dose if she is agreeable. Thanks.

## 2018-10-06 NOTE — Telephone Encounter (Signed)
Sent in 88 mcg synthroid daily.

## 2018-10-06 NOTE — Telephone Encounter (Signed)
Patient advised and agreed, patient would like Rx sent to [facility] on [address] KW

## 2018-10-20 ENCOUNTER — Other Ambulatory Visit: Payer: Self-pay

## 2018-10-20 ENCOUNTER — Encounter: Payer: Self-pay | Admitting: Gastroenterology

## 2018-10-20 ENCOUNTER — Ambulatory Visit: Payer: Medicare HMO | Admitting: Gastroenterology

## 2018-10-20 ENCOUNTER — Ambulatory Visit (INDEPENDENT_AMBULATORY_CARE_PROVIDER_SITE_OTHER): Payer: Medicare HMO | Admitting: Gastroenterology

## 2018-10-20 VITALS — BP 131/73 | HR 63 | Temp 97.9°F | Resp 16 | Ht 62.0 in | Wt 129.2 lb

## 2018-10-20 DIAGNOSIS — K5900 Constipation, unspecified: Secondary | ICD-10-CM

## 2018-10-20 NOTE — Progress Notes (Signed)
[facility] [place]  [place], [place] [address]  Main: [phone]  Fax: [phone]   Gastroenterology Consultation  Referring Provider:     Paulene Floor Primary Care Physician:  Paulene Floor Reason for Consultation:     Abdominal pain, constipation        HPI:    Chief Complaint  Patient presents with  . New Patient (Initial Visit)  . Abdominal Pain  . Bloated  . Constipation    Hailey Cain is a 73 y.o. y/o female referred for consultation & management  by Dr. Terrilee Croak, Wendee Beavers, PA-C.  Patient reports chronic history of constipation since she was a child.  However, reports worsening of symptoms after her small bowel obstruction in September 2018.  Patient states prior to the small bowel obstruction she would have a bowel movement every day, but since then has had infrequent bowel movements and has been using Metamucil daily with milk of magnesia as needed.  With this she is only having a bowel movement twice a week.  No nausea or vomiting.  No blood in stool.  No weight loss.  Does report abdominal bloating, and mid abdominal pain when she is constipated, which improves after a bowel movement.  Pain is 5/10, nonradiating, dull.  Patient has not been on any other medications besides the Metamucil and as needed milk of magnesia for constipation.  Is on omeprazole for acid reflux and this controls her symptoms for the most part, but will have breakthrough symptoms once or twice a week.  No dysphagia.  During her 2018 admission, she was seen by surgery and managed conservatively with NG tube decompression and discharged after improvement.  Previous abdominal surgeries include C-section and hysterectomy.  Screening colonoscopy in 2014 by Dr. Vira Agar, with extent of exam cecum, good prep, significant looping and tortuous colon reported.  Internal hemorrhoids reported.  Repeat recommended in 10 years.  Scope withdrawal time was  less than 6 minutes.  No prior upper endoscopy.  Past Medical History:  Diagnosis Date  . Acid reflux 12/30/2006  . Adult hypothyroidism 01/13/2007  . Allergic rhinitis 09/07/2014  . Anxiety 12/10/2014  . Bronchitis 05/17/2015  . Cancer of cheek ([place]) 07/04/2007  . Depression, neurotic 12/30/2006  . Hyperlipidemia   . Prediabetes 08/11/2016  . RAD (reactive airway disease) 09/07/2014  . Small bowel obstruction ([place]) 01/31/2017    Past Surgical History:  Procedure Laterality Date  . ABDOMINAL HYSTERECTOMY  1988   heavy, irregular menses; ovaries intact  . AUGMENTATION MAMMAPLASTY Bilateral    silicone  . BREAST SURGERY  1970   augmentation  . EYE SURGERY     cataract extraction  . EYE SURGERY  2007   LASIK  . TONSILLECTOMY AND ADENOIDECTOMY  1950    Prior to Admission medications   Medication Sig Start Date End Date Taking? Authorizing Provider  alendronate (FOSAMAX) 70 MG tablet Take 1 tablet (70 mg total) by mouth every 7 (seven) days. Take with a full glass of water on an empty stomach. 10/05/18  Yes Pollak, Adriana M, PA-C  aspirin 81 MG tablet Take 81 mg by mouth daily.    Yes [provider]  Calcium Carbonate (CALCIUM 600 PO) Take by mouth daily.    Yes [provider]  Cholecalciferol 5000 units capsule Take 5,000 Units by mouth daily.    Yes [provider]  cyanocobalamin 100 MCG tablet Take 100 mcg by mouth daily.  10/13/13  Yes [provider]  FLUoxetine (PROZAC) 20 MG capsule TAKE 2 CAPSULES (40 MG TOTAL) BY MOUTH ONCE DAILY 10/05/18  Yes Carles Collet M, PA-C  levothyroxine (SYNTHROID) 88 MCG tablet Take 1 tablet (88 mcg total) by mouth daily before breakfast. 10/06/18 01/04/19 Yes Pollak, Adriana M, PA-C  montelukast (SINGULAIR) 10 MG tablet Take 1 tablet (10 mg total) by mouth at bedtime. Reported on 05/17/2015 10/05/18 04/03/19 Yes Trinna Post, PA-C  Multiple Vitamins-Minerals (EMERGEN-C IMMUNE PO) Take 1 tablet by mouth as needed.     Yes [provider]  simvastatin (ZOCOR) 40 MG tablet Take 1 tablet (40 mg total) by mouth daily. 09/19/18  Yes Carles Collet M, PA-C  loratadine (CLARITIN) 10 MG tablet Take 1 tablet (10 mg total) by mouth daily. Reported on 05/17/2015 03/29/18 10/05/18  Trinna Post, PA-C  omeprazole (PRILOSEC) 20 MG capsule Take 1 capsule (20 mg total) by mouth daily. 07/01/18 10/05/18  Trinna Post, PA-C    Family History  Problem Relation Age of Onset  . Stroke Mother   . CAD Mother   . Heart attack Father   . Hyperthyroidism Sister   . Heart Problems Brother   . Cancer Brother        skin  . CAD Brother        CABG     Social History   Tobacco Use  . Smoking status: Former Smoker    Packs/day: 1.00    Years: 20.00    Pack years: 20.00    Quit date: 05/12/2003    Years since quitting: 15.4  . Smokeless tobacco: Never Used  Substance Use Topics  . Alcohol use: No    Alcohol/week: 0.0 standard drinks  . Drug use: No    Allergies as of 10/20/2018  . (No Known Allergies)    Review of Systems:    All systems reviewed and negative except where noted in HPI.   Physical Exam:  BP 131/73 (BP Location: Left Arm, Patient Position: Sitting, Cuff Size: Normal)   Pulse 63   Temp 97.9 F (36.6 C)   Resp 16   Ht 5\' 2"  (1.575 m)   Wt 129 lb 3.2 oz (58.6 kg)   BMI 23.63 kg/m  No LMP recorded. Patient has had a hysterectomy. Psych:  Alert and cooperative. Normal mood and affect. General:   Alert,  Well-developed, well-nourished, pleasant and cooperative in NAD Head:  Normocephalic and atraumatic. Eyes:  Sclera clear, no icterus.   Conjunctiva pink. Ears:  Normal auditory acuity. Nose:  No deformity, discharge, or lesions. Mouth:  No deformity or lesions,oropharynx pink & moist. Neck:  Supple; no masses or thyromegaly. Abdomen:  Normal bowel sounds.  No bruits.  Soft, non-tender and non-distended without masses, hepatosplenomegaly or hernias noted.  No guarding or rebound  tenderness.    Msk:  Symmetrical without gross deformities. Good, equal movement & strength bilaterally. Pulses:  Normal pulses noted. Extremities:  No clubbing or edema.  No cyanosis. Neurologic:  Alert and oriented x3;  grossly normal neurologically. Skin:  Intact without significant lesions or rashes. No jaundice. Lymph Nodes:  No significant cervical adenopathy. Psych:  Alert and cooperative. Normal mood and affect.   Labs: CBC    Component Value Date/Time   WBC 5.5 02/03/2017 0523   RBC 3.89 02/03/2017 0523   HGB 12.6 02/03/2017 0523   HGB 12.9 08/11/2016 1044   HCT 36.7 02/03/2017 0523   HCT 39.3 08/11/2016 1044   PLT  218 02/03/2017 0523   PLT 234 08/11/2016 1044   MCV 94.3 02/03/2017 0523   MCV 94 08/11/2016 1044   MCH 32.4 02/03/2017 0523   MCHC 34.4 02/03/2017 0523   RDW 13.4 02/03/2017 0523   RDW 14.4 08/11/2016 1044   LYMPHSABS 1.6 08/11/2016 1044   EOSABS 0.1 08/11/2016 1044   BASOSABS 0.0 08/11/2016 1044   CMP     Component Value Date/Time   NA 141 10/05/2018 1020   K 4.3 10/05/2018 1020   CL 102 10/05/2018 1020   CO2 25 10/05/2018 1020   GLUCOSE 87 10/05/2018 1020   GLUCOSE 112 (H) 02/03/2017 0523   BUN 12 10/05/2018 1020   CREATININE 0.76 10/05/2018 1020   CALCIUM 9.3 10/05/2018 1020   PROT 6.1 10/05/2018 1020   ALBUMIN 3.9 10/05/2018 1020   AST 16 10/05/2018 1020   ALT 11 10/05/2018 1020   ALKPHOS 36 (L) 10/05/2018 1020   BILITOT 0.2 10/05/2018 1020   GFRNONAA 79 10/05/2018 1020   GFRAA 91 10/05/2018 1020    Imaging Studies: No results found.  Assessment and Plan:   Hailey Cain is a 73 y.o. y/o female has been referred for constipation, abdominal bloating  Patient abdominal bloating and discomfort symptoms are very much related to her constipation I have asked her to stop her Metamucil Start MiraLAX daily and after 1 week, increase it to twice daily if she is not having a bowel movement at least every other day.  High-fiber diet   If above measures do not lead to symptom improvement, will need to consider pharmacologic therapy such as Linzess  However, we will see her back in 4 to 6 weeks to reassess symptoms and given the short withdrawal time during the 2014 colonoscopy, consider repeat colonoscopy after discussion with her if symptoms are no better.  Patient is agreeable with the above plan  Follow-up 4 to 6 weeks   Dr Vonda Antigua  Speech recognition software was used to dictate the above note.

## 2018-10-24 ENCOUNTER — Ambulatory Visit: Payer: Medicare HMO | Admitting: Gastroenterology

## 2018-10-31 ENCOUNTER — Other Ambulatory Visit: Payer: Self-pay | Admitting: Physician Assistant

## 2018-10-31 MED ORDER — OMEPRAZOLE 20 MG PO CPDR: 20.0000 mg | DELAYED_RELEASE_CAPSULE | Freq: Every day | ORAL | 0 refills | Status: DC

## 2018-10-31 NOTE — Telephone Encounter (Signed)
Pt calling to refill her omeprazole (PRILOSEC) 20 MG capsule Wants 90 day supply  Please fill at:  [facility] ([facility] [place] [address] [phone] (Phone) [phone] (Fax)   Thanks, [employer]

## 2018-11-14 ENCOUNTER — Telehealth: Payer: Self-pay | Admitting: Physician Assistant

## 2018-11-14 NOTE — Telephone Encounter (Signed)
Patient advised of lab results

## 2018-11-14 NOTE — Telephone Encounter (Signed)
Pt called asking if we ever got the results back from her labs that were done for her liver several weeks ago.Marland Kitchen  CB#  [phone]  Thanks Con Memos

## 2018-11-17 ENCOUNTER — Ambulatory Visit (INDEPENDENT_AMBULATORY_CARE_PROVIDER_SITE_OTHER): Payer: Medicare HMO | Admitting: Physician Assistant

## 2018-11-17 DIAGNOSIS — E039 Hypothyroidism, unspecified: Secondary | ICD-10-CM

## 2018-11-17 NOTE — Progress Notes (Signed)
Patient: Hailey Cain Female    DOB: [birth year]   73 y.o.   MRN: [id number] Visit Date: 11/18/2018  Today's Provider: Trinna Post, PA-C   Chief Complaint  Patient presents with  . Follow-up   Virtual Visit via Telephone Note  I connected with Hailey Cain on 11/18/18 at  8:20 AM EDT by telephone and verified that I am speaking with the correct person using two identifiers.   I discussed the limitations, risks, security and privacy concerns of performing an evaluation and management service by telephone and the availability of in person appointments. I also discussed with the patient that there may be a patient responsible charge related to this service. The patient expressed understanding and agreed to proceed.  Patient location: home Provider location: [place] office  Persons involved in the visit: patient, provider   HPI  Follow up for thyroid  The patient was last seen for this 6 weeks ago. Changes made at last visit include increase synthroid to 88 mcg daily.  She reports excellent compliance with treatment. She feels that condition is Unchanged. She is not having side effects.   Lab Results  Component Value Date   TSH 7.510 (H) 10/05/2018   Wt Readings from Last 3 Encounters:  10/20/18 129 lb 3.2 oz (58.6 kg)  10/05/18 129 lb (58.5 kg)  10/05/18 129 lb 12.8 oz (58.9 kg)     ------------------------------------------------------------------------------------   No Known Allergies   Current Outpatient Medications:  .  alendronate (FOSAMAX) 70 MG tablet, Take 1 tablet (70 mg total) by mouth every 7 (seven) days. Take with a full glass of water on an empty stomach., Disp: 12 tablet, Rfl: 1 .  aspirin 81 MG tablet, Take 81 mg by mouth daily. , Disp: , Rfl:  .  Calcium Carbonate (CALCIUM 600 PO), Take by mouth daily. , Disp: , Rfl:  .  Cholecalciferol 5000 units capsule, Take 5,000 Units by mouth daily. , Disp: , Rfl:  .   cyanocobalamin 100 MCG tablet, Take 100 mcg by mouth daily. , Disp: , Rfl:  .  FLUoxetine (PROZAC) 20 MG capsule, TAKE 2 CAPSULES (40 MG TOTAL) BY MOUTH ONCE DAILY, Disp: 180 capsule, Rfl: 1 .  levothyroxine (SYNTHROID) 88 MCG tablet, Take 1 tablet (88 mcg total) by mouth daily before breakfast., Disp: 90 tablet, Rfl: 0 .  loratadine (CLARITIN) 10 MG tablet, Take 1 tablet (10 mg total) by mouth daily. Reported on 05/17/2015, Disp: 90 tablet, Rfl: 1 .  montelukast (SINGULAIR) 10 MG tablet, Take 1 tablet (10 mg total) by mouth at bedtime. Reported on 05/17/2015, Disp: 90 tablet, Rfl: 1 .  Multiple Vitamins-Minerals (EMERGEN-C IMMUNE PO), Take 1 tablet by mouth as needed. , Disp: , Rfl:  .  omeprazole (PRILOSEC) 20 MG capsule, Take 1 capsule (20 mg total) by mouth daily., Disp: 90 capsule, Rfl: 0 .  simvastatin (ZOCOR) 40 MG tablet, Take 1 tablet (40 mg total) by mouth daily., Disp: 90 tablet, Rfl: 1  Review of Systems  Constitutional: Negative.   Cardiovascular: Negative.   Endocrine: Negative.     Social History   Tobacco Use  . Smoking status: Former Smoker    Packs/day: 1.00    Years: 20.00    Pack years: 20.00    Quit date: 05/12/2003    Years since quitting: 15.5  . Smokeless tobacco: Never Used  Substance Use Topics  . Alcohol use: No    Alcohol/week: 0.0 standard  drinks      Objective:   There were no vitals taken for this visit. There were no vitals filed for this visit.   Physical Exam Pulmonary:     Effort: No respiratory distress.     Comments: Speaking in complete sentences.      No results found for any visits on 11/17/18.     Assessment & Plan    1. Adult hypothyroidism  Recheck after increasing synthroid to 88 mcg QD. If stable, will continue with yearly follow ups.   - TSH  The entirety of the information documented in the History of Present Illness, Review of Systems and Physical Exam were personally obtained by me. Portions of this information were  initially documented by Lynford Humphrey, CMA and reviewed by me for thoroughness and accuracy.       Trinna Post, PA-C  [facility]

## 2018-11-17 NOTE — Patient Instructions (Signed)
Hypothyroidism  Hypothyroidism is when the thyroid gland does not make enough of certain hormones (it is underactive). The thyroid gland is a small gland located in the lower front part of the neck, just in front of the windpipe (trachea). This gland makes hormones that help control how the body uses food for energy (metabolism) as well as how the heart and brain function. These hormones also play a role in keeping your bones strong. When the thyroid is underactive, it produces too little of the hormones thyroxine (T4) and triiodothyronine (T3). What are the causes? This condition may be caused by:  Hashimoto's disease. This is a disease in which the body's disease-fighting system (immune system) attacks the thyroid gland. This is the most common cause.  Viral infections.  Pregnancy.  Certain medicines.  Birth defects.  Past radiation treatments to the head or neck for cancer.  Past treatment with radioactive iodine.  Past exposure to radiation in the environment.  Past surgical removal of part or all of the thyroid.  Problems with a gland in the center of the brain (pituitary gland).  Lack of enough iodine in the diet. What increases the risk? You are more likely to develop this condition if:  You are female.  You have a family history of thyroid conditions.  You use a medicine called lithium.  You take medicines that affect the immune system (immunosuppressants). What are the signs or symptoms? Symptoms of this condition include:  Feeling as though you have no energy (lethargy).  Not being able to tolerate cold.  Weight gain that is not explained by a change in diet or exercise habits.  Lack of appetite.  Dry skin.  Coarse hair.  Menstrual irregularity.  Slowing of thought processes.  Constipation.  Sadness or depression. How is this diagnosed? This condition may be diagnosed based on:  Your symptoms, your medical history, and a physical exam.  Blood  tests. You may also have imaging tests, such as an ultrasound or MRI. How is this treated? This condition is treated with medicine that replaces the thyroid hormones that your body does not make. After you begin treatment, it may take several weeks for symptoms to go away. Follow these instructions at home:  Take over-the-counter and prescription medicines only as told by your health care provider.  If you start taking any new medicines, tell your health care provider.  Keep all follow-up visits as told by your health care provider. This is important. ? As your condition improves, your dosage of thyroid hormone medicine may change. ? You will need to have blood tests regularly so that your health care provider can monitor your condition. Contact a health care provider if:  Your symptoms do not get better with treatment.  You are taking thyroid replacement medicine and you: ? Sweat a lot. ? Have tremors. ? Feel anxious. ? Lose weight rapidly. ? Cannot tolerate heat. ? Have emotional swings. ? Have diarrhea. ? Feel weak. Get help right away if you have:  Chest pain.  An irregular heartbeat.  A rapid heartbeat.  Difficulty breathing. Summary  Hypothyroidism is when the thyroid gland does not make enough of certain hormones (it is underactive).  When the thyroid is underactive, it produces too little of the hormones thyroxine (T4) and triiodothyronine (T3).  The most common cause is Hashimoto's disease, a disease in which the body's disease-fighting system (immune system) attacks the thyroid gland. The condition can also be caused by viral infections, medicine, pregnancy, or past   radiation treatment to the head or neck.  Symptoms may include weight gain, dry skin, constipation, feeling as though you do not have energy, and not being able to tolerate cold.  This condition is treated with medicine to replace the thyroid hormones that your body does not make. This information  is not intended to replace advice given to you by your health care provider. Make sure you discuss any questions you have with your health care provider. Document Released: 04/27/2005 Document Revised: 04/09/2017 Document Reviewed: 04/07/2017 Elsevier Patient Education  2020 [employer]  

## 2018-11-30 ENCOUNTER — Ambulatory Visit: Payer: Medicare HMO | Admitting: Gastroenterology

## 2019-01-02 ENCOUNTER — Other Ambulatory Visit: Payer: Self-pay | Admitting: Physician Assistant

## 2019-01-02 DIAGNOSIS — E039 Hypothyroidism, unspecified: Secondary | ICD-10-CM

## 2019-01-26 ENCOUNTER — Telehealth: Payer: Self-pay | Admitting: Physician Assistant

## 2019-01-26 MED ORDER — OMEPRAZOLE 20 MG PO CPDR: 20.0000 mg | DELAYED_RELEASE_CAPSULE | Freq: Every day | ORAL | 0 refills | Status: DC

## 2019-01-26 NOTE — Telephone Encounter (Signed)
Pt needs refill on her  Omeprazole [place]

## 2019-02-06 DIAGNOSIS — R69 Illness, unspecified: Secondary | ICD-10-CM | POA: Diagnosis not present

## 2019-03-08 DIAGNOSIS — H02839 Dermatochalasis of unspecified eye, unspecified eyelid: Secondary | ICD-10-CM | POA: Diagnosis not present

## 2019-03-08 DIAGNOSIS — Z961 Presence of intraocular lens: Secondary | ICD-10-CM | POA: Diagnosis not present

## 2019-03-08 DIAGNOSIS — H35372 Puckering of macula, left eye: Secondary | ICD-10-CM | POA: Diagnosis not present

## 2019-03-17 NOTE — Progress Notes (Addendum)
[facility] Note  03/24/2019     CHIEF COMPLAINT Patient presents for Retina Evaluation   HISTORY OF PRESENT ILLNESS: Hailey Cain is a 73 y.o. female who presents to the clinic today for:   HPI    Retina Evaluation    In both eyes.  This started 3 months ago.  Associated Symptoms Floaters.  Negative for Flashes, Pain, Trauma, Fever, Redness, Scalp Tenderness, Weight Loss, Distortion, Photophobia, Jaw Claudication, Fatigue, Blind Spot, Glare and Shoulder/Hip pain.  Context:  distance vision, mid-range vision and near vision.  Treatments tried include surgery.  Response to treatment was significant improvement.  I, the attending physician,  performed the HPI with the patient and updated documentation appropriately.          Comments    Referral of Dr. Ellin Mayhew for retina eval. Patient states she has noticed her vision has became blurry in the past three months and she has occasional floaters OS, denies flashes and ocular pain. Pt had laser tx 2012 and  cataract sx ou in 2013. Pt is using Soothe OU PRN.        Last edited by Bernarda Caffey, MD on 03/26/2019  9:52 PM. (History)    pt states she saw Dr. Ellin Mayhew for routine eye exam bc she was not seeing as well out of her left eye (which is her reading eye), she states she feels like her left eye vision has been blurry for about 6 months, she states she has not had an eye exam since 2013 when she had her cataract sx with Dr. Talbert Forest, she had a cyst on her left eye that she went to [facility] for, she states they drained it in the office, pt states lines are sometimes crooked when she is trying to read  Referring physician: Anell Barr, [place],  [place] [address]  HISTORICAL INFORMATION:   Selected notes from the MEDICAL RECORD NUMBER Referred by Dr. Truman Hayward:  Ocular Hx- PMH-   CURRENT MEDICATIONS: Current Outpatient Medications (Ophthalmic Drugs)  Medication Sig  . prednisoLONE  acetate (PRED FORTE) 1 % ophthalmic suspension Place 1 drop into the left eye 4 (four) times daily for 7 days.   No current facility-administered medications for this visit.  (Ophthalmic Drugs)   Current Outpatient Medications (Other)  Medication Sig  . alendronate (FOSAMAX) 70 MG tablet Take 1 tablet (70 mg total) by mouth every 7 (seven) days. Take with a full glass of water on an empty stomach.  Marland Kitchen aspirin 81 MG tablet Take 81 mg by mouth daily.   . Calcium Carbonate (CALCIUM 600 PO) Take by mouth daily.   . Cholecalciferol 5000 units capsule Take 5,000 Units by mouth daily.   . cyanocobalamin 100 MCG tablet Take 100 mcg by mouth daily.   Marland Kitchen FLUoxetine (PROZAC) 20 MG capsule TAKE 2 CAPSULES (40 MG TOTAL) BY MOUTH ONCE DAILY  . levothyroxine (SYNTHROID) 88 MCG tablet TAKE 1 TABLET BY MOUTH ONCE DAILY BEFORE BREAKFAST  . montelukast (SINGULAIR) 10 MG tablet Take 1 tablet (10 mg total) by mouth at bedtime. Reported on 05/17/2015  . Multiple Vitamins-Minerals (EMERGEN-C IMMUNE PO) Take 1 tablet by mouth as needed.   Marland Kitchen omeprazole (PRILOSEC) 20 MG capsule Take 1 capsule (20 mg total) by mouth daily.  . simvastatin (ZOCOR) 40 MG tablet Take 1 tablet (40 mg total) by mouth daily.  Marland Kitchen loratadine (CLARITIN) 10 MG tablet Take 1 tablet (10 mg total)  by mouth daily. Reported on 05/17/2015   No current facility-administered medications for this visit.  (Other)      REVIEW OF SYSTEMS: ROS    Positive for: Eyes   Negative for: Constitutional, Gastrointestinal, Neurological, Skin, Genitourinary, Musculoskeletal, HENT, Endocrine, Cardiovascular, Respiratory, Psychiatric, Allergic/Imm, Heme/Lymph   Last edited by Zenovia Jordan, LPN on 075-GRM  [id number] AM. (History)       ALLERGIES No Known Allergies  PAST MEDICAL HISTORY Past Medical History:  Diagnosis Date  . Acid reflux 12/30/2006  . Adult hypothyroidism 01/13/2007  . Allergic rhinitis 09/07/2014  . Anxiety 12/10/2014  . Bronchitis 05/17/2015  .  Cancer of cheek ([place]) 07/04/2007  . Depression, neurotic 12/30/2006  . Hyperlipidemia   . Prediabetes 08/11/2016  . RAD (reactive airway disease) 09/07/2014  . Small bowel obstruction ([place]) 01/31/2017   Past Surgical History:  Procedure Laterality Date  . ABDOMINAL HYSTERECTOMY  1988   heavy, irregular menses; ovaries intact  . AUGMENTATION MAMMAPLASTY Bilateral    silicone  . BREAST SURGERY  1970   augmentation  . CATARACT EXTRACTION    . EYE SURGERY     cataract extraction  . EYE SURGERY  2007   LASIK  . TONSILLECTOMY AND ADENOIDECTOMY  1950    FAMILY HISTORY Family History  Problem Relation Age of Onset  . Stroke Mother   . CAD Mother   . Heart attack Father   . Hyperthyroidism Sister   . Heart Problems Brother   . Cancer Brother        skin  . CAD Brother        CABG    SOCIAL HISTORY Social History   Tobacco Use  . Smoking status: Former Smoker    Packs/day: 1.00    Years: 20.00    Pack years: 20.00    Quit date: 05/12/2003    Years since quitting: 15.8  . Smokeless tobacco: Never Used  Substance Use Topics  . Alcohol use: No    Alcohol/week: 0.0 standard drinks  . Drug use: No         OPHTHALMIC EXAM:  Base Eye Exam    Visual Acuity (Snellen - Linear)      Right Left   Dist [place] 20/25 -2 20/100   Dist ph [place] NI NI       Tonometry (Tonopen, 8:41 AM)      Right Left   Pressure 11 14       Pupils      Dark Light Shape React APD   Right 3 2 Round Brisk None   Left 3 2 Round Brisk None       Visual Fields (Counting fingers)      Left Right    Full Full       Extraocular Movement      Right Left    Full, Ortho Full, Ortho       Neuro/Psych    Oriented x3: Yes   Mood/Affect: Normal       Dilation    Both eyes: 1.0% Mydriacyl, 2.5% Phenylephrine @ 8:37 AM        Slit Lamp and Fundus Exam    Slit Lamp Exam      Right Left   Lids/Lashes Dermatochalasis - upper lid, mild Meibomian gland dysfunction Dermatochalasis - upper lid, mild  Meibomian gland dysfunction   Conjunctiva/Sclera Trace nasal Pinguecula Trace nasal Pinguecula   Cornea Arcus Arcus   Anterior Chamber Deep and quiet Deep and quiet   Iris  Round and dilated Round and moderately dilated   Lens PC IOL in good position, trace, non-central Posterior capsular opacification PC IOL in good position, 1-2+Posterior capsular opacification   Vitreous Vitreous syneresis Vitreous syneresis, Posterior vitreous detachment       Fundus Exam      Right Left   Disc Pink and Sharp Pink and Sharp   C/D Ratio 0.3 0.3   Macula Flat, Good foveal reflex, parafoveal Drusen Flat, Blunted foveal reflex, +Epiretinal membrane with striae and central thickening, parafoveal drusen, no heme   Vessels Vascular attenuation, Tortuous Vascular attenuation, Tortuous   Periphery Attached, No heme  Attached, No heme           IMAGING AND PROCEDURES  Imaging and Procedures for @TODAY @  OCT, Retina - OU - Both Eyes       Right Eye Quality was good. Central Foveal Thickness: 274. Progression has no prior data. Findings include abnormal foveal contour, no IRF, retinal drusen , no SRF, epiretinal membrane (Mild ERM greatest nasally).   Left Eye Quality was good. Central Foveal Thickness: 476. Progression has no prior data. Findings include abnormal foveal contour, epiretinal membrane, macular pucker, no IRF, no SRF, retinal drusen , preretinal fibrosis.   Notes *Images captured and stored on drive  Diagnosis / Impression:  Non-exu ARMD OU ERM OU (OS > OD) OS: +ERM with pucker/PRF  Clinical management:  See below  Abbreviations: NFP - Normal foveal profile. CME - cystoid macular edema. PED - pigment epithelial detachment. IRF - intraretinal fluid. SRF - subretinal fluid. EZ - ellipsoid zone. ERM - epiretinal membrane. ORA - outer retinal atrophy. ORT - outer retinal tubulation. SRHM - subretinal hyper-reflective material        Yag Capsulotomy - OS - Left Eye        Procedure note: YAG Capsulotomy, LEFT Eye  Informed consent obtained. Pre-op dilating drops (1% Topicamide and 2.5% Phenylephrine), and topical anesthesia given. Power: 6.7 mJ Shots: 27 Posterior capsulotomy in cruciate formation performed without difficulty. Patient tolerated procedure well. No complications. Rx pred forte 4 times a day for 7 days, then stop. Pt received written and verbal post laser education. Recheck in 2 weeks w/ dilated exam.                 ASSESSMENT/PLAN:    ICD-10-CM   1. Epiretinal membrane (ERM) of both eyes  H35.373   2. Retinal edema  H35.81 OCT, Retina - OU - Both Eyes  3. Pseudophakia of both eyes  Z96.1   4. Bilateral posterior capsular opacification  H26.493 Yag Capsulotomy - OS - Left Eye    1,2. Epiretinal membrane, OU  - OD: mild ERM, greatest nasally  - OS: with pucker and PRF  - The natural history, anatomy, potential for loss of vision, and treatment options including vitrectomy techniques and the complications of endophthalmitis, retinal detachment, vitreous hemorrhage, cataract progression and permanent vision loss discussed with the patient.  - OD asymptomatic, no metamorphopsia  - no indication for surgery at this time -- monitor  - OS +metamorophopsia with reading  - OS likely will need PPV w/ ERM peel  - will address PCO OS first (see below)  - f/u 2 wks  3. Pseudophakia OU  - s/p CE/IOL OU (Dr Talbert Forest, 2013) -- monovision with OS being near eye  - beautiful surgeries, doing well  - monitor  4. PCO OU  - OD: trace, non-central  - OS: [id number] PCO -- complicating ERM symptoms  -  recommend YAG OS -- pt wishes to proceed  - RBA of procedure discussed, questions answered  - informed consent obtained and signed  - see procedure note  - f/u 2 weeks   Ophthalmic Meds Ordered this visit:  Meds ordered this encounter  Medications  . prednisoLONE acetate (PRED FORTE) 1 % ophthalmic suspension    Sig: Place 1 drop into  the left eye 4 (four) times daily for 7 days.    Dispense:  10 mL    Refill:  0       Return in about 2 weeks (around 04/07/2019) for f/u s/p PCO OS, DFE, OCT.  There are no Patient Instructions on file for this visit.   Explained the diagnoses, plan, and follow up with the patient and they expressed understanding.  Patient expressed understanding of the importance of proper follow up care.   This document serves as a record of services personally performed by Gardiner Sleeper, MD, PhD. It was created on their behalf by Ernest Mallick, OA, an ophthalmic assistant. The creation of this record is the provider's dictation and/or activities during the visit.    Electronically signed by: Ernest Mallick, OA 11.06.2020 10:01 PM   Gardiner Sleeper, M.D., Ph.D. Diseases & Surgery of the Retina and Vitreous [facility]  I have reviewed the above documentation for accuracy and completeness, and I agree with the above. Gardiner Sleeper, M.D., Ph.D. 03/26/19 10:01 PM   Abbreviations: M myopia (nearsighted); A astigmatism; H hyperopia (farsighted); P presbyopia; Mrx spectacle prescription;  CTL contact lenses; OD right eye; OS left eye; OU both eyes  XT exotropia; ET esotropia; PEK punctate epithelial keratitis; PEE punctate epithelial erosions; DES dry eye syndrome; MGD meibomian gland dysfunction; ATs artificial tears; PFAT's preservative free artificial tears; [place] nuclear sclerotic cataract; PSC posterior subcapsular cataract; ERM epi-retinal membrane; PVD posterior vitreous detachment; RD retinal detachment; DM diabetes mellitus; DR diabetic retinopathy; NPDR non-proliferative diabetic retinopathy; PDR proliferative diabetic retinopathy; CSME clinically significant macular edema; DME diabetic macular edema; dbh dot blot hemorrhages; CWS cotton wool spot; POAG primary open angle glaucoma; C/D cup-to-disc ratio; HVF humphrey visual field; GVF goldmann visual field; OCT optical  coherence tomography; IOP intraocular pressure; BRVO Branch retinal vein occlusion; CRVO central retinal vein occlusion; CRAO central retinal artery occlusion; BRAO branch retinal artery occlusion; RT retinal tear; SB scleral buckle; PPV pars plana vitrectomy; VH Vitreous hemorrhage; PRP panretinal laser photocoagulation; IVK intravitreal kenalog; VMT vitreomacular traction; MH Macular hole;  NVD neovascularization of the disc; NVE neovascularization elsewhere; AREDS age related eye disease study; ARMD age related macular degeneration; POAG primary open angle glaucoma; EBMD epithelial/anterior basement membrane dystrophy; ACIOL anterior chamber intraocular lens; IOL intraocular lens; PCIOL posterior chamber intraocular lens; Phaco/IOL phacoemulsification with intraocular lens placement; [place] photorefractive keratectomy; LASIK laser assisted in situ keratomileusis; HTN hypertension; DM diabetes mellitus; COPD chronic obstructive pulmonary disease

## 2019-03-24 ENCOUNTER — Encounter (INDEPENDENT_AMBULATORY_CARE_PROVIDER_SITE_OTHER): Payer: Self-pay | Admitting: Ophthalmology

## 2019-03-24 ENCOUNTER — Ambulatory Visit (INDEPENDENT_AMBULATORY_CARE_PROVIDER_SITE_OTHER): Payer: Medicare HMO | Admitting: Ophthalmology

## 2019-03-24 DIAGNOSIS — H3581 Retinal edema: Secondary | ICD-10-CM | POA: Diagnosis not present

## 2019-03-24 DIAGNOSIS — H35373 Puckering of macula, bilateral: Secondary | ICD-10-CM

## 2019-03-24 DIAGNOSIS — Z961 Presence of intraocular lens: Secondary | ICD-10-CM

## 2019-03-24 DIAGNOSIS — H26493 Other secondary cataract, bilateral: Secondary | ICD-10-CM

## 2019-03-24 HISTORY — PX: YAG LASER APPLICATION: SHX6189

## 2019-03-24 MED ORDER — PREDNISOLONE ACETATE 1 % OP SUSP: 1.0000 [drp] | Freq: Four times a day (QID) | OPHTHALMIC | 0 refills | Status: AC

## 2019-03-26 ENCOUNTER — Encounter (INDEPENDENT_AMBULATORY_CARE_PROVIDER_SITE_OTHER): Payer: Self-pay | Admitting: Ophthalmology

## 2019-03-31 NOTE — Progress Notes (Signed)
[facility] Note  04/11/2019     CHIEF COMPLAINT Patient presents for Retina Follow Up   HISTORY OF PRESENT ILLNESS: Hailey Cain is a 73 y.o. female who presents to the clinic today for:   HPI    Retina Follow Up    Patient presents with  Other.  In left eye.  This started 2 weeks ago.  Severity is moderate.  Duration of 2 weeks.  Since onset it is stable.  I, the attending physician,  performed the HPI with the patient and updated documentation appropriately.          Comments    73 y/o female pt here for 2 wk f/u for ERM OS.  Pt also s/p yag OS 11.13.20.  No change in [place] OU.  Denies pain, flashes, floaters.  AT prn OU.       Last edited by Bernarda Caffey, MD on 04/11/2019 11:03 AM. (History)    pt states she cannot tell if her vision has gotten any better since her last visit  Referring physician: Anell Barr, [place],  [place] [address]  HISTORICAL INFORMATION:   Selected notes from the [facility]: No current outpatient medications on file. (Ophthalmic Drugs)   No current facility-administered medications for this visit.  (Ophthalmic Drugs)   Current Outpatient Medications (Other)  Medication Sig  . alendronate (FOSAMAX) 70 MG tablet Take 1 tablet (70 mg total) by mouth every 7 (seven) days. Take with a full glass of water on an empty stomach.  . ALPRAZolam (XANAX) 0.25 MG tablet SMARTSIG:1 Tablet(s) By Mouth Every 12 Hours PRN  . aspirin 81 MG tablet Take 81 mg by mouth daily.   . Calcium Carbonate (CALCIUM 600 PO) Take by mouth daily.   . cephALEXin (KEFLEX) 500 MG capsule Take 500 mg by mouth 3 (three) times daily.  . Cholecalciferol 5000 units capsule Take 5,000 Units by mouth daily.   . cyanocobalamin 100 MCG tablet Take 100 mcg by mouth daily.   Marland Kitchen FLUoxetine (PROZAC) 20 MG capsule TAKE 2 CAPSULES (40 MG TOTAL) BY MOUTH ONCE DAILY  . levothyroxine (SYNTHROID) 88 MCG tablet TAKE  1 TABLET BY MOUTH ONCE DAILY BEFORE BREAKFAST  . Multiple Vitamins-Minerals (EMERGEN-C IMMUNE PO) Take 1 tablet by mouth as needed.   Marland Kitchen omeprazole (PRILOSEC) 20 MG capsule Take 1 capsule (20 mg total) by mouth daily.  . ondansetron (ZOFRAN-ODT) 4 MG disintegrating tablet 4 mg every 8 (eight) hours as needed.  Marland Kitchen oxyCODONE-acetaminophen (PERCOCET/ROXICET) 5-325 MG tablet Take 1 tablet by mouth every 8 (eight) hours.  . simvastatin (ZOCOR) 40 MG tablet Take 1 tablet (40 mg total) by mouth daily.  Marland Kitchen loratadine (CLARITIN) 10 MG tablet Take 1 tablet (10 mg total) by mouth daily. Reported on 05/17/2015  . montelukast (SINGULAIR) 10 MG tablet Take 1 tablet (10 mg total) by mouth at bedtime. Reported on 05/17/2015   No current facility-administered medications for this visit.  (Other)      REVIEW OF SYSTEMS: ROS    Positive for: Gastrointestinal, Eyes   Negative for: Constitutional, Neurological, Skin, Genitourinary, Musculoskeletal, HENT, Endocrine, Cardiovascular, Respiratory, Psychiatric, Allergic/Imm, Heme/Lymph   Last edited by Matthew Folks, COA on 04/11/2019  9:26 AM. (History)       ALLERGIES No Known Allergies  PAST MEDICAL HISTORY Past Medical History:  Diagnosis Date  . Acid reflux 12/30/2006  . Adult hypothyroidism 01/13/2007  .  Allergic rhinitis 09/07/2014  . Anxiety 12/10/2014  . Bronchitis 05/17/2015  . Cancer of cheek ([place]) 07/04/2007  . Depression, neurotic 12/30/2006  . Hyperlipidemia   . Prediabetes 08/11/2016  . RAD (reactive airway disease) 09/07/2014  . Small bowel obstruction ([place]) 01/31/2017   Past Surgical History:  Procedure Laterality Date  . ABDOMINAL HYSTERECTOMY  1988   heavy, irregular menses; ovaries intact  . AUGMENTATION MAMMAPLASTY Bilateral    silicone  . BREAST SURGERY  1970   augmentation  . CATARACT EXTRACTION Bilateral 2013   Dr. Talbert Forest.  [place].  OD-Distance.  Marland Kitchen EYE SURGERY     cataract extraction  . EYE SURGERY  2007   LASIK  . TONSILLECTOMY AND  ADENOIDECTOMY  1950  . YAG LASER APPLICATION Left [id number]   Dr. Bernarda Caffey    FAMILY HISTORY Family History  Problem Relation Age of Onset  . Stroke Mother   . CAD Mother   . Heart attack Father   . Hyperthyroidism Sister   . Heart Problems Brother   . Cancer Brother        skin  . CAD Brother        CABG    SOCIAL HISTORY Social History   Tobacco Use  . Smoking status: Former Smoker    Packs/day: 1.00    Years: 20.00    Pack years: 20.00    Quit date: 05/12/2003    Years since quitting: 15.9  . Smokeless tobacco: Never Used  Substance Use Topics  . Alcohol use: No    Alcohol/week: 0.0 standard drinks  . Drug use: No         OPHTHALMIC EXAM:  Base Eye Exam    Visual Acuity (Snellen - Linear)      Right Left   Dist [place] 20/25 +2 20/100   Dist ph [place] NI 20/80 -2       Tonometry (Tonopen, 9:31 AM)      Right Left   Pressure 12 13       Pupils      Dark Light Shape React APD   Right 3 2 Round Brisk None   Left 3 2 Round Brisk None       Visual Fields (Counting fingers)      Left Right    Full Full       Extraocular Movement      Right Left    Full, Ortho Full, Ortho       Neuro/Psych    Oriented x3: Yes   Mood/Affect: Normal       Dilation    Both eyes: 1.0% Mydriacyl, 2.5% Phenylephrine @ 9:31 AM        Slit Lamp and Fundus Exam    Slit Lamp Exam      Right Left   Lids/Lashes Dermatochalasis - upper lid, mild Meibomian gland dysfunction Dermatochalasis - upper lid, mild Meibomian gland dysfunction   Conjunctiva/Sclera Trace nasal Pinguecula Trace nasal Pinguecula   Cornea Arcus Arcus, 2-3+ Punctate epithelial erosions   Anterior Chamber Deep and quiet Deep and quiet   Iris Round and dilated Round and moderately dilated   Lens PC IOL in good position, trace, non-central Posterior capsular opacification PC IOL in good position with open PC   Vitreous Vitreous syneresis Vitreous syneresis, Posterior vitreous detachment       Fundus  Exam      Right Left   Disc Pink and Sharp trace pallor, Sharp rim   C/D Ratio 0.3 0.3  Macula Flat, Good foveal reflex, parafoveal Drusen Flat, Blunted foveal reflex, +Epiretinal membrane with striae and central thickening, parafoveal drusen, no heme   Vessels Vascular attenuation, Tortuous Vascular attenuation, Tortuous   Periphery Attached, No heme  Attached, No heme           IMAGING AND PROCEDURES  Imaging and Procedures for @TODAY @  OCT, Retina - OU - Both Eyes       Right Eye Quality was good. Central Foveal Thickness: 276. Progression has been stable. Findings include abnormal foveal contour, no IRF, retinal drusen , no SRF, epiretinal membrane (Mild ERM greatest nasally).   Left Eye Quality was good. Central Foveal Thickness: 478. Progression has been stable. Findings include abnormal foveal contour, epiretinal membrane, macular pucker, no IRF, no SRF, retinal drusen , preretinal fibrosis.   Notes *Images captured and stored on drive  Diagnosis / Impression:  Non-exu ARMD OU ERM OU (OS > OD) OS: +ERM with pucker/PRF  Clinical management:  See below  Abbreviations: NFP - Normal foveal profile. CME - cystoid macular edema. PED - pigment epithelial detachment. IRF - intraretinal fluid. SRF - subretinal fluid. EZ - ellipsoid zone. ERM - epiretinal membrane. ORA - outer retinal atrophy. ORT - outer retinal tubulation. SRHM - subretinal hyper-reflective material                 ASSESSMENT/PLAN:    ICD-10-CM   1. Epiretinal membrane (ERM) of both eyes  H35.373   2. Retinal edema  H35.81 OCT, Retina - OU - Both Eyes  3. Pseudophakia of both eyes  Z96.1   4. Bilateral posterior capsular opacification  H26.493     1,2. Epiretinal membrane, OU  - OD: mild ERM, greatest nasally  - OS: with pucker and PRF  - OD asymptomatic, no metamorphopsia  - no indication for surgery at this time -- monitor  - OS +metamorophopsia with reading  - pt reports no significant  improvement in symptoms post-YAG OS suggesting majority of visual symptoms likely coming from ERM OS  - recommend 25g PPV w/ ERM peel, possible gas OS under general anesthesia  - RBA of procedure discussed, questions answered  - pt wishes to proceed with surgery - pt considering Dec 17 vs May 18, 2019 for surgery date  - pt to call back after discussing options with family  - f/u 2 wks, sooner prn -- pre-op visit w/ consents  3. Pseudophakia OU  - s/p CE/IOL OU (Dr Talbert Forest, 2013) -- monovision with OS being near eye  - beautiful surgeries, doing well  - monitor  4. PCO OU  - OD: trace, non-central  - OS s/p YAG cap 11.13.20 - nice opening in place  - BCVA OS improved from 20/100 to 20/80  - monitor   Ophthalmic Meds Ordered this visit:  No orders of the defined types were placed in this encounter.      Return in about 2 weeks (around 04/25/2019).  There are no Patient Instructions on file for this visit.   Explained the diagnoses, plan, and follow up with the patient and they expressed understanding.  Patient expressed understanding of the importance of proper follow up care.   This document serves as a record of services personally performed by Gardiner Sleeper, MD, PhD. It was created on their behalf by Estill Bakes, COT an ophthalmic technician. The creation of this record is the provider's dictation and/or activities during the visit.    Electronically signed by: Estill Bakes, COT 03/31/19 @  10:35 PM  Gardiner Sleeper, M.D., Ph.D. Diseases & Surgery of the Retina and [place] 04/11/2019    I have reviewed the above documentation for accuracy and completeness, and I agree with the above. Gardiner Sleeper, M.D., Ph.D. 04/11/19 10:35 PM   Abbreviations: M myopia (nearsighted); A astigmatism; H hyperopia (farsighted); P presbyopia; Mrx spectacle prescription;  CTL contact lenses; OD right eye; OS left eye; OU both eyes  XT exotropia; ET  esotropia; PEK punctate epithelial keratitis; PEE punctate epithelial erosions; DES dry eye syndrome; MGD meibomian gland dysfunction; ATs artificial tears; PFAT's preservative free artificial tears; [place] nuclear sclerotic cataract; PSC posterior subcapsular cataract; ERM epi-retinal membrane; PVD posterior vitreous detachment; RD retinal detachment; DM diabetes mellitus; DR diabetic retinopathy; NPDR non-proliferative diabetic retinopathy; PDR proliferative diabetic retinopathy; CSME clinically significant macular edema; DME diabetic macular edema; dbh dot blot hemorrhages; CWS cotton wool spot; POAG primary open angle glaucoma; C/D cup-to-disc ratio; HVF humphrey visual field; GVF goldmann visual field; OCT optical coherence tomography; IOP intraocular pressure; BRVO Branch retinal vein occlusion; CRVO central retinal vein occlusion; CRAO central retinal artery occlusion; BRAO branch retinal artery occlusion; RT retinal tear; SB scleral buckle; PPV pars plana vitrectomy; VH Vitreous hemorrhage; PRP panretinal laser photocoagulation; IVK intravitreal kenalog; VMT vitreomacular traction; MH Macular hole;  NVD neovascularization of the disc; NVE neovascularization elsewhere; AREDS age related eye disease study; ARMD age related macular degeneration; POAG primary open angle glaucoma; EBMD epithelial/anterior basement membrane dystrophy; ACIOL anterior chamber intraocular lens; IOL intraocular lens; PCIOL posterior chamber intraocular lens; Phaco/IOL phacoemulsification with intraocular lens placement; [place] photorefractive keratectomy; LASIK laser assisted in situ keratomileusis; HTN hypertension; DM diabetes mellitus; COPD chronic obstructive pulmonary disease

## 2019-04-11 ENCOUNTER — Encounter (INDEPENDENT_AMBULATORY_CARE_PROVIDER_SITE_OTHER): Payer: Self-pay | Admitting: Ophthalmology

## 2019-04-11 ENCOUNTER — Other Ambulatory Visit: Payer: Self-pay

## 2019-04-11 ENCOUNTER — Ambulatory Visit (INDEPENDENT_AMBULATORY_CARE_PROVIDER_SITE_OTHER): Payer: Medicare HMO | Admitting: Ophthalmology

## 2019-04-11 DIAGNOSIS — H3581 Retinal edema: Secondary | ICD-10-CM

## 2019-04-11 DIAGNOSIS — H26493 Other secondary cataract, bilateral: Secondary | ICD-10-CM

## 2019-04-11 DIAGNOSIS — Z961 Presence of intraocular lens: Secondary | ICD-10-CM

## 2019-04-11 DIAGNOSIS — H35373 Puckering of macula, bilateral: Secondary | ICD-10-CM

## 2019-04-24 NOTE — Progress Notes (Signed)
[facility] Note  04/28/2019     CHIEF COMPLAINT Patient presents for Retina Follow Up   HISTORY OF PRESENT ILLNESS: Hailey Cain is a 73 y.o. female who presents to the clinic today for:   HPI    Retina Follow Up    Patient presents with  Other (ERM).  In both eyes.  Severity is moderate.  Duration of 2.5 weeks.  Since onset it is stable.  I, the attending physician,  performed the HPI with the patient and updated documentation appropriately.          Comments    Patient states vision still blurred OS. No change from prior visit.        Last edited by Bernarda Caffey, MD on 04/30/2019  2:37 PM. (History)    pt is here to fill out paperwork for sx  Referring physician: Trinna Post, PA-C [address] [place],  [place] [address]  HISTORICAL INFORMATION:   Selected notes from the [facility]: No current outpatient medications on file. (Ophthalmic Drugs)   No current facility-administered medications for this visit. (Ophthalmic Drugs)   Current Outpatient Medications (Other)  Medication Sig  . alendronate (FOSAMAX) 70 MG tablet Take 1 tablet (70 mg total) by mouth every 7 (seven) days. Take with a full glass of water on an empty stomach.  . ALPRAZolam (XANAX) 0.25 MG tablet SMARTSIG:1 Tablet(s) By Mouth Every 12 Hours PRN  . aspirin 81 MG tablet Take 81 mg by mouth daily.   . Calcium Carbonate (CALCIUM 600 PO) Take by mouth daily.   . cephALEXin (KEFLEX) 500 MG capsule Take 500 mg by mouth 3 (three) times daily.  . Cholecalciferol 5000 units capsule Take 5,000 Units by mouth daily.   . cyanocobalamin 100 MCG tablet Take 100 mcg by mouth daily.   Marland Kitchen FLUoxetine (PROZAC) 20 MG capsule TAKE 2 CAPSULES (40 MG TOTAL) BY MOUTH ONCE DAILY  . levothyroxine (SYNTHROID) 88 MCG tablet TAKE 1 TABLET BY MOUTH ONCE DAILY BEFORE BREAKFAST  . Multiple Vitamins-Minerals (EMERGEN-C IMMUNE PO) Take 1 tablet by  mouth as needed.   . ondansetron (ZOFRAN-ODT) 4 MG disintegrating tablet 4 mg every 8 (eight) hours as needed.  Marland Kitchen oxyCODONE-acetaminophen (PERCOCET/ROXICET) 5-325 MG tablet Take 1 tablet by mouth every 8 (eight) hours.  . simvastatin (ZOCOR) 40 MG tablet Take 1 tablet (40 mg total) by mouth daily.  Marland Kitchen loratadine (CLARITIN) 10 MG tablet Take 1 tablet (10 mg total) by mouth daily. Reported on 05/17/2015  . montelukast (SINGULAIR) 10 MG tablet Take 1 tablet (10 mg total) by mouth at bedtime. Reported on 05/17/2015  . omeprazole (PRILOSEC) 20 MG capsule Take 1 capsule (20 mg total) by mouth daily.   No current facility-administered medications for this visit. (Other)      REVIEW OF SYSTEMS: ROS    Positive for: Gastrointestinal, Eyes   Negative for: Constitutional, Neurological, Skin, Genitourinary, Musculoskeletal, HENT, Endocrine, Cardiovascular, Respiratory, Psychiatric, Allergic/Imm, Heme/Lymph   Last edited by Roselee Nova D, COT on 04/28/2019  2:15 PM. (History)       ALLERGIES No Known Allergies  PAST MEDICAL HISTORY Past Medical History:  Diagnosis Date  . Acid reflux 12/30/2006  . Adult hypothyroidism 01/13/2007  . Allergic rhinitis 09/07/2014  . Anxiety 12/10/2014  . Bronchitis 05/17/2015  . Cancer of cheek ([place]) 07/04/2007  . Depression, neurotic 12/30/2006  . Hyperlipidemia   . Prediabetes 08/11/2016  . RAD (  reactive airway disease) 09/07/2014  . Small bowel obstruction ([place]) 01/31/2017   Past Surgical History:  Procedure Laterality Date  . ABDOMINAL HYSTERECTOMY  1988   heavy, irregular menses; ovaries intact  . AUGMENTATION MAMMAPLASTY Bilateral    silicone  . BREAST SURGERY  1970   augmentation  . CATARACT EXTRACTION Bilateral 2013   Dr. Talbert Forest.  [place].  OD-Distance.  Marland Kitchen EYE SURGERY     cataract extraction  . EYE SURGERY  2007   LASIK  . TONSILLECTOMY AND ADENOIDECTOMY  1950  . YAG LASER APPLICATION Left [id number]   Dr. Bernarda Caffey    FAMILY HISTORY Family History   Problem Relation Age of Onset  . Stroke Mother   . CAD Mother   . Heart attack Father   . Hyperthyroidism Sister   . Heart Problems Brother   . Cancer Brother        skin  . CAD Brother        CABG    SOCIAL HISTORY Social History   Tobacco Use  . Smoking status: Former Smoker    Packs/day: 1.00    Years: 20.00    Pack years: 20.00    Quit date: 05/12/2003    Years since quitting: 15.9  . Smokeless tobacco: Never Used  Substance Use Topics  . Alcohol use: No    Alcohol/week: 0.0 standard drinks  . Drug use: No         OPHTHALMIC EXAM:  Base Eye Exam    Visual Acuity (Snellen - Linear)      Right Left   Dist cc 20/20 20/200 +1   Dist ph cc  20/80 +2   Correction: Glasses       Tonometry (Tonopen, 2:25 PM)      Right Left   Pressure 16 15       Pupils      Dark Light Shape React APD   Right 3 2 Round Brisk None   Left 3 2 Round Brisk None       Visual Fields (Counting fingers)      Left Right    Full Full       Extraocular Movement      Right Left    Full, Ortho Full, Ortho       Neuro/Psych    Oriented x3: Yes   Mood/Affect: Normal       Dilation    Both eyes: 1.0% Mydriacyl, 2.5% Phenylephrine @ 2:25 PM        Slit Lamp and Fundus Exam    Slit Lamp Exam      Right Left   Lids/Lashes Dermatochalasis - upper lid, mild Meibomian gland dysfunction Dermatochalasis - upper lid, mild Meibomian gland dysfunction   Conjunctiva/Sclera Trace nasal Pinguecula Trace nasal Pinguecula   Cornea Arcus Arcus, trace Punctate epithelial erosions   Anterior Chamber Deep and quiet Deep and quiet   Iris Round and dilated Round and moderately dilated   Lens PC IOL in good position, trace, non-central Posterior capsular opacification PC IOL in good position with open PC   Vitreous Vitreous syneresis Vitreous syneresis, Posterior vitreous detachment       Fundus Exam      Right Left   Disc Pink and Sharp trace pallor, Sharp rim   C/D Ratio 0.3 0.3    Macula Flat, Good foveal reflex, parafoveal Drusen Flat, Blunted foveal reflex, +Epiretinal membrane with striae and central thickening, parafoveal drusen, no heme   Vessels Vascular attenuation, Tortuous Vascular  attenuation, Tortuous   Periphery Attached, No heme  Attached, No heme         Refraction    Manifest Refraction      Sphere Cylinder Axis Dist VA   Right +0.25 +0.75 017 20/20   Left -3.00 +0.50 155 20/50-2          IMAGING AND PROCEDURES  Imaging and Procedures for @TODAY @  OCT, Retina - OU - Both Eyes       Right Eye Quality was good. Central Foveal Thickness: 274. Progression has been stable. Findings include abnormal foveal contour, no IRF, retinal drusen , no SRF, epiretinal membrane (Mild ERM greatest nasally).   Left Eye Quality was good. Central Foveal Thickness: 473. Progression has been stable. Findings include abnormal foveal contour, epiretinal membrane, macular pucker, no IRF, no SRF, retinal drusen , preretinal fibrosis.   Notes *Images captured and stored on drive  Diagnosis / Impression:  Non-exu ARMD OU ERM OU (OS > OD) OS: +ERM with pucker/PRF  Clinical management:  See below  Abbreviations: NFP - Normal foveal profile. CME - cystoid macular edema. PED - pigment epithelial detachment. IRF - intraretinal fluid. SRF - subretinal fluid. EZ - ellipsoid zone. ERM - epiretinal membrane. ORA - outer retinal atrophy. ORT - outer retinal tubulation. SRHM - subretinal hyper-reflective material                 ASSESSMENT/PLAN:    ICD-10-CM   1. Epiretinal membrane (ERM) of both eyes  H35.373   2. Retinal edema  H35.81 OCT, Retina - OU - Both Eyes  3. Pseudophakia of both eyes  Z96.1   4. Bilateral posterior capsular opacification  H26.493     1,2. Epiretinal membrane, OU  - OD: mild ERM, greatest nasally  - OS: with pucker and PRF  - OD asymptomatic, no metamorphopsia  - no indication for surgery at this time -- monitor  - OS  +metamorophopsia with reading  - pt reports no significant improvement in symptoms post-YAG OS suggesting majority of visual symptoms likely coming from ERM OS  - recommend 25g PPV w/ ERM peel, possible gas OS under general anesthesia  - pt wishes to proceed with surgery  - RBA of procedure discussed, questions answered  - informed consent obtained and signed  - surgery scheduled for May 25, 2019, [facility] OR 8, 2PM   - will need pre-op COVID test and medical clearance from PCP  3. Pseudophakia OU  - s/p CE/IOL OU (Dr Talbert Forest, 2013) -- monovision with OS being near eye  - beautiful surgeries, doing well  - monitor  4. PCO OU  - OD: trace, non-central  - OS s/p YAG cap 11.13.20 - nice opening in place  - BCVA OS improved from 20/100 to 20/80  - stable, monitor   Ophthalmic Meds Ordered this visit:  No orders of the defined types were placed in this encounter.      Return in about 4 weeks (around 05/26/2019) for POV.  There are no Patient Instructions on file for this visit.   Explained the diagnoses, plan, and follow up with the patient and they expressed understanding.  Patient expressed understanding of the importance of proper follow up care.   This document serves as a record of services personally performed by Gardiner Sleeper, MD, PhD. It was created on their behalf by Ernest Mallick, OA, an ophthalmic assistant. The creation of this record is the provider's dictation and/or activities during the visit.  Electronically signed by: Ernest Mallick, OA 12.14.2020 2:43 PM  Gardiner Sleeper, M.D., Ph.D. Diseases & Surgery of the Retina and Vitreous [facility]  I have reviewed the above documentation for accuracy and completeness, and I agree with the above. Gardiner Sleeper, M.D., Ph.D. 04/30/19 2:43 PM   Abbreviations: M myopia (nearsighted); A astigmatism; H hyperopia (farsighted); P presbyopia; Mrx spectacle prescription;  CTL contact lenses; OD right  eye; OS left eye; OU both eyes  XT exotropia; ET esotropia; PEK punctate epithelial keratitis; PEE punctate epithelial erosions; DES dry eye syndrome; MGD meibomian gland dysfunction; ATs artificial tears; PFAT's preservative free artificial tears; [place] nuclear sclerotic cataract; PSC posterior subcapsular cataract; ERM epi-retinal membrane; PVD posterior vitreous detachment; RD retinal detachment; DM diabetes mellitus; DR diabetic retinopathy; NPDR non-proliferative diabetic retinopathy; PDR proliferative diabetic retinopathy; CSME clinically significant macular edema; DME diabetic macular edema; dbh dot blot hemorrhages; CWS cotton wool spot; POAG primary open angle glaucoma; C/D cup-to-disc ratio; HVF humphrey visual field; GVF goldmann visual field; OCT optical coherence tomography; IOP intraocular pressure; BRVO Branch retinal vein occlusion; CRVO central retinal vein occlusion; CRAO central retinal artery occlusion; BRAO branch retinal artery occlusion; RT retinal tear; SB scleral buckle; PPV pars plana vitrectomy; VH Vitreous hemorrhage; PRP panretinal laser photocoagulation; IVK intravitreal kenalog; VMT vitreomacular traction; MH Macular hole;  NVD neovascularization of the disc; NVE neovascularization elsewhere; AREDS age related eye disease study; ARMD age related macular degeneration; POAG primary open angle glaucoma; EBMD epithelial/anterior basement membrane dystrophy; ACIOL anterior chamber intraocular lens; IOL intraocular lens; PCIOL posterior chamber intraocular lens; Phaco/IOL phacoemulsification with intraocular lens placement; [place] photorefractive keratectomy; LASIK laser assisted in situ keratomileusis; HTN hypertension; DM diabetes mellitus; COPD chronic obstructive pulmonary disease

## 2019-04-26 ENCOUNTER — Encounter (INDEPENDENT_AMBULATORY_CARE_PROVIDER_SITE_OTHER): Payer: Medicare HMO | Admitting: Ophthalmology

## 2019-04-28 ENCOUNTER — Other Ambulatory Visit: Payer: Self-pay

## 2019-04-28 ENCOUNTER — Encounter (INDEPENDENT_AMBULATORY_CARE_PROVIDER_SITE_OTHER): Payer: Self-pay | Admitting: Ophthalmology

## 2019-04-28 ENCOUNTER — Ambulatory Visit (INDEPENDENT_AMBULATORY_CARE_PROVIDER_SITE_OTHER): Payer: Medicare HMO | Admitting: Ophthalmology

## 2019-04-28 DIAGNOSIS — H3581 Retinal edema: Secondary | ICD-10-CM

## 2019-04-28 DIAGNOSIS — H26493 Other secondary cataract, bilateral: Secondary | ICD-10-CM

## 2019-04-28 DIAGNOSIS — H35373 Puckering of macula, bilateral: Secondary | ICD-10-CM | POA: Diagnosis not present

## 2019-04-28 DIAGNOSIS — Z961 Presence of intraocular lens: Secondary | ICD-10-CM

## 2019-04-30 ENCOUNTER — Encounter (INDEPENDENT_AMBULATORY_CARE_PROVIDER_SITE_OTHER): Payer: Self-pay | Admitting: Ophthalmology

## 2019-05-01 ENCOUNTER — Other Ambulatory Visit: Payer: Self-pay | Admitting: Physician Assistant

## 2019-05-01 DIAGNOSIS — M810 Age-related osteoporosis without current pathological fracture: Secondary | ICD-10-CM

## 2019-05-01 MED ORDER — OMEPRAZOLE 20 MG PO CPDR: 20.0000 mg | DELAYED_RELEASE_CAPSULE | Freq: Every day | ORAL | 0 refills | Status: DC

## 2019-05-01 MED ORDER — ALENDRONATE SODIUM 70 MG PO TABS: 70.0000 mg | ORAL_TABLET | ORAL | 1 refills | Status: DC

## 2019-05-01 NOTE — Telephone Encounter (Signed)
Medication Refill - Medication: alendronate (FOSAMAX) 70 MG tablet, and her medication for indigestion Has the patient contacted their pharmacy? Yes.   (Agent: If no, request that the patient contact the pharmacy for the refill.) (Agent: If yes, when and what did the pharmacy advise?)  Preferred Pharmacy (with phone number or street name): [facility] [place] - [address]  Agent: Please be advised that RX refills may take up to 3 business days. We ask that you follow-up with your pharmacy.

## 2019-05-22 ENCOUNTER — Other Ambulatory Visit (HOSPITAL_COMMUNITY)
Admission: RE | Admit: 2019-05-22 | Discharge: 2019-05-22 | Disposition: A | Discharge status: Home / Self Care | Payer: Medicare HMO | Source: Ambulatory Visit | Attending: Ophthalmology | Admitting: Ophthalmology

## 2019-05-22 DIAGNOSIS — Z20822 Contact with and (suspected) exposure to covid-19: Secondary | ICD-10-CM | POA: Insufficient documentation

## 2019-05-22 DIAGNOSIS — Z01812 Encounter for preprocedural laboratory examination: Secondary | ICD-10-CM | POA: Insufficient documentation

## 2019-05-22 NOTE — H&P (Signed)
Hailey Cain is an 74 y.o. female.    Chief Complaint: decreased vision, left eye  HPI: Pt with a several month history of decreased vision with distortion, left eye. On dilated exam, found to have an epiretinal membrane of the left eye causing the majority of symptoms. After a discussion of the risks, benefits and alternative treatments, the patient elected to proceed with surgery to remove the ERM of the left eye -- 25g PPV w/ ERM peel OS under general anesthesia.  Past Medical History:  Diagnosis Date  . Acid reflux 12/30/2006  . Adult hypothyroidism 01/13/2007  . Allergic rhinitis 09/07/2014  . Anxiety 12/10/2014  . Bronchitis 05/17/2015  . Cancer of cheek ([place]) 07/04/2007  . Depression, neurotic 12/30/2006  . Hyperlipidemia   . Prediabetes 08/11/2016  . RAD (reactive airway disease) 09/07/2014  . Small bowel obstruction ([place]) 01/31/2017    Past Surgical History:  Procedure Laterality Date  . ABDOMINAL HYSTERECTOMY  1988   heavy, irregular menses; ovaries intact  . AUGMENTATION MAMMAPLASTY Bilateral    silicone  . BREAST SURGERY  1970   augmentation  . CATARACT EXTRACTION Bilateral 2013   Dr. Talbert Forest.  Mono.  OD-Distance.  Marland Kitchen EYE SURGERY     cataract extraction  . EYE SURGERY  2007   LASIK  . TONSILLECTOMY AND ADENOIDECTOMY  1950  . YAG LASER APPLICATION Left [id number]   Dr. Bernarda Caffey    Family History  Problem Relation Age of Onset  . Stroke Mother   . CAD Mother   . Heart attack Father   . Hyperthyroidism Sister   . Heart Problems Brother   . Cancer Brother        skin  . CAD Brother        CABG   Social History:  reports that she quit smoking about 16 years ago. She has a 20.00 pack-year smoking history. She has never used smokeless tobacco. She reports that she does not drink alcohol or use drugs.  Allergies: No Known Allergies  No medications prior to admission.    Review of systems otherwise negative  There were no vitals taken for this visit.  Physical  exam: Mental status: oriented x3. Eyes: See eye exam associated with this date of surgery Ears, Nose, Throat: within normal limits Neck: Within Normal limits General: within normal limits Chest: Within normal limits Breast: deferred Heart: Within normal limits Abdomen: Within normal limits GU: deferred Extremities: within normal limits Skin: within normal limits  Assessment/Plan 1. Epiretinal membrane, left eye  Plan: To [facility] for 25g PPV w/ ERM peel, OS, under general anesthesia - case scheduled for 05/25/2019 -- [facility] OR 08  Gardiner Sleeper, M.D., Ph.D. Vitreoretinal Surgeon [facility]

## 2019-05-23 ENCOUNTER — Ambulatory Visit (INDEPENDENT_AMBULATORY_CARE_PROVIDER_SITE_OTHER): Payer: Medicare HMO | Admitting: Physician Assistant

## 2019-05-23 ENCOUNTER — Encounter (HOSPITAL_COMMUNITY): Payer: Self-pay | Admitting: Ophthalmology

## 2019-05-23 ENCOUNTER — Other Ambulatory Visit (HOSPITAL_COMMUNITY)
Admission: RE | Admit: 2019-05-23 | Discharge: 2019-05-23 | Disposition: A | Discharge status: Home / Self Care | Payer: Medicare HMO | Source: Ambulatory Visit | Attending: Physician Assistant | Admitting: Physician Assistant

## 2019-05-23 ENCOUNTER — Encounter: Payer: Self-pay | Admitting: Physician Assistant

## 2019-05-23 ENCOUNTER — Other Ambulatory Visit: Payer: Self-pay

## 2019-05-23 VITALS — BP 150/79 | HR 65 | Temp 97.1°F | Wt 123.2 lb

## 2019-05-23 DIAGNOSIS — Z01818 Encounter for other preprocedural examination: Secondary | ICD-10-CM

## 2019-05-23 DIAGNOSIS — N898 Other specified noninflammatory disorders of vagina: Secondary | ICD-10-CM | POA: Insufficient documentation

## 2019-05-23 DIAGNOSIS — R8290 Unspecified abnormal findings in urine: Secondary | ICD-10-CM

## 2019-05-23 LAB — NOVEL CORONAVIRUS, NAA (HOSP ORDER, SEND-OUT TO REF LAB; TAT 18-24 HRS): SARS-CoV-2, NAA: NOT DETECTED

## 2019-05-23 NOTE — Progress Notes (Signed)
Patient was given pre-op instructions over the phone. The opportunity was given for the patient to ask questions. No further questions asked. Patient verbalized understanding of instructions given.   Nothing to eat or drink after midnight.  7 days prior to surgery STOP taking any Aspirin (unless otherwise instructed by your surgeon), Aleve, Naproxen, Ibuprofen, Motrin, Advil, Goody's, BC's, all herbal medications, fish oil, and all vitamins.  Last dose of ASA was on 05/21/19   Anesthesia Review Needed: n/a

## 2019-05-23 NOTE — Progress Notes (Signed)
Patient: Hailey Cain Female    DOB: [birth year]   74 y.o.   MRN: [id number] Visit Date: 05/23/2019  Today's Provider: Trinna Post, PA-C   Chief Complaint  Patient presents with  . Surgical Clearance   Subjective:     HPI    Surgical Clearance Patient presents today for surgical clearance. She is going to have retinal surgery on her left eye with Dr. Coralyn Pear on 05/24/2018. She is to undergo general anesthesia. She has been under anesthesia prior and did not have issues. She denies any chest pain or SOB. She denies fevers, chills. She does not have sleep apnea. She does not have any history of cardiac issues. She takes an Aspirin which she has stopped 5 days prior to the date of her surgery.   She is reporting some vaginal discharge and some malodorous urine. No back pain, nausea, vomiting.    No Known Allergies   Current Outpatient Medications:  .  alendronate (FOSAMAX) 70 MG tablet, Take 1 tablet (70 mg total) by mouth every 7 (seven) days. Take with a full glass of water on an empty stomach. (Patient taking differently: Take 70 mg by mouth every Wednesday. Take with a full glass of water on an empty stomach.), Disp: 12 tablet, Rfl: 1 .  aspirin EC 325 MG tablet, Take 325 mg by mouth daily., Disp: , Rfl:  .  Calcium Carbonate (CALCIUM 600 PO), Take 600 mg by mouth 2 (two) times a week. , Disp: , Rfl:  .  Cholecalciferol (VITAMIN D-3) 25 MCG (1000 UT) CAPS, Take 1,000 Units by mouth daily., Disp: , Rfl:  .  FLUoxetine (PROZAC) 20 MG capsule, TAKE 2 CAPSULES (40 MG TOTAL) BY MOUTH ONCE DAILY (Patient taking differently: Take 20 mg by mouth 2 (two) times daily. TAKE 2 CAPSULES (40 MG TOTAL) BY MOUTH ONCE DAILY), Disp: 180 capsule, Rfl: 1 .  levothyroxine (SYNTHROID) 88 MCG tablet, TAKE 1 TABLET BY MOUTH ONCE DAILY BEFORE BREAKFAST (Patient taking differently: Take 88 mcg by mouth daily before breakfast. ), Disp: 90 tablet, Rfl: 1 .  loratadine (CLARITIN) 10 MG tablet, Take  1 tablet (10 mg total) by mouth daily. Reported on 05/17/2015 (Patient taking differently: Take 10 mg by mouth daily as needed for allergies. Reported on 05/17/2015), Disp: 90 tablet, Rfl: 1 .  Multiple Vitamins-Minerals (EMERGEN-C IMMUNE PO), Take 1 tablet by mouth daily as needed (immunity boost). , Disp: , Rfl:  .  omeprazole (PRILOSEC) 20 MG capsule, Take 1 capsule (20 mg total) by mouth daily., Disp: 90 capsule, Rfl: 0 .  Polyethyl Glycol-Propyl Glycol (LUBRICANT EYE DROPS) 0.4-0.3 % SOLN, Place 1 drop into both eyes 2 (two) times daily., Disp: , Rfl:  .  simvastatin (ZOCOR) 40 MG tablet, Take 1 tablet (40 mg total) by mouth daily. (Patient taking differently: Take 40 mg by mouth every evening. ), Disp: 90 tablet, Rfl: 1 .  vitamin B-12 (CYANOCOBALAMIN) 500 MCG tablet, Take 500 mcg by mouth daily., Disp: , Rfl:   Review of Systems  Constitutional: Negative.   Cardiovascular: Negative.   Neurological: Negative.     Social History   Tobacco Use  . Smoking status: Former Smoker    Packs/day: 1.00    Years: 20.00    Pack years: 20.00    Quit date: 05/12/2003    Years since quitting: 16.0  . Smokeless tobacco: Never Used  Substance Use Topics  . Alcohol use: No    Alcohol/week: 0.0  standard drinks      Objective:   BP (!) 150/79 (BP Location: Left Arm, Patient Position: Sitting, Cuff Size: Normal)   Pulse 65   Temp (!) 97.1 F (36.2 C) (Temporal)   Wt 123 lb 3.2 oz (55.9 kg)   BMI 22.53 kg/m  Vitals:   05/23/19 1124  BP: (!) 150/79  Pulse: 65  Temp: (!) 97.1 F (36.2 C)  TempSrc: Temporal  Weight: 123 lb 3.2 oz (55.9 kg)  Body mass index is 22.53 kg/m.   Physical Exam Constitutional:      Appearance: Normal appearance.  Cardiovascular:     Rate and Rhythm: Normal rate and regular rhythm.     Heart sounds: Normal heart sounds.  Pulmonary:     Effort: Pulmonary effort is normal.     Breath sounds: Normal breath sounds.  Skin:    General: Skin is warm and dry.    Neurological:     Mental Status: She is alert and oriented to person, place, and time. Mental status is at baseline.  Psychiatric:        Mood and Affect: Mood normal.        Behavior: Behavior normal.      No results found for any visits on 05/23/19.     Assessment & Plan    1. Pre-operative clearance  EKG with borderline first AV block but otherwise unremarkable. Will fax most recent labwork along with EKG and order new as below.  - EKG 12-Lead - Comprehensive Metabolic Panel (CMET) - CBC with Differential - HgB A1c  2. Abnormal urine odor  - CULTURE, URINE COMPREHENSIVE  3. Vaginal discharge  - Cervicovaginal ancillary only  The entirety of the information documented in the History of Present Illness, Review of Systems and Physical Exam were personally obtained by me. Portions of this information were initially documented by [facility] and reviewed by me for thoroughness and accuracy.   I have spent 25 minutes with this patient, >50% was spent on counseling and coordination of care.     Trinna Post, PA-C  [facility]

## 2019-05-23 NOTE — Progress Notes (Signed)
Hailey Cain  05/26/2019     CHIEF COMPLAINT Patient presents for Post-op Follow-up   HISTORY OF PRESENT ILLNESS: Hailey Cain is a 74 y.o. female who presents to the clinic today for:   HPI    Post-op Follow-up    In left eye.  Discomfort includes pain.  Negative for itching, foreign body sensation, tearing, discharge, floaters and none.  Vision is blurred at distance and is blurred at near.  I, the attending physician,  performed the HPI with the patient and updated documentation appropriately.          Comments    Patient states OS feels a little raw this am. Slept on right side last night.        Last edited by Bernarda Caffey, MD on 05/26/2019  9:51 AM. (History)    pt states her oxygen has not been very good since sx, she states it got as low as 74 yesterday, but it was back up to 93 before she came in this morning, she states they had a hard time getting her oxygen levels to where they needed to be in recovery yesterday, she states she is not having any respiratory issues this morning, she states her eye feels "raw",   Referring physician: Trinna Post, PA-C [address] [place],  [place] [address]  HISTORICAL INFORMATION:   Selected notes from the MEDICAL RECORD NUMBER    CURRENT MEDICATIONS: Current Outpatient Medications (Ophthalmic Drugs)  Medication Sig  . Polyethyl Glycol-Propyl Glycol (LUBRICANT EYE DROPS) 0.4-0.3 % SOLN Place 1 drop into both eyes 2 (two) times daily.   No current facility-administered medications for this visit. (Ophthalmic Drugs)   Current Outpatient Medications (Other)  Medication Sig  . alendronate (FOSAMAX) 70 MG tablet Take 1 tablet (70 mg total) by mouth every 7 (seven) days. Take with a full glass of water on an empty stomach. (Patient taking differently: Take 70 mg by mouth every Wednesday. Take with a full glass of water on an empty stomach.)  . aspirin EC 325 MG tablet Take 325 mg  by mouth daily.  . Calcium Carbonate (CALCIUM 600 PO) Take 600 mg by mouth 2 (two) times a week.   . Cholecalciferol (VITAMIN D-3) 25 MCG (1000 UT) CAPS Take 1,000 Units by mouth daily.  Marland Kitchen FLUoxetine (PROZAC) 20 MG capsule TAKE 2 CAPSULES (40 MG TOTAL) BY MOUTH ONCE DAILY (Patient taking differently: Take 20 mg by mouth 2 (two) times daily. TAKE 2 CAPSULES (40 MG TOTAL) BY MOUTH ONCE DAILY)  . levothyroxine (SYNTHROID) 88 MCG tablet TAKE 1 TABLET BY MOUTH ONCE DAILY BEFORE BREAKFAST (Patient taking differently: Take 88 mcg by mouth daily before breakfast. )  . loratadine (CLARITIN) 10 MG tablet Take 1 tablet (10 mg total) by mouth daily. Reported on 05/17/2015 (Patient taking differently: Take 10 mg by mouth daily as needed for allergies. Reported on 05/17/2015)  . Multiple Vitamins-Minerals (EMERGEN-C IMMUNE PO) Take 1 tablet by mouth daily as needed (immunity boost).   Marland Kitchen omeprazole (PRILOSEC) 20 MG capsule Take 1 capsule (20 mg total) by mouth daily.  . simvastatin (ZOCOR) 40 MG tablet Take 1 tablet (40 mg total) by mouth daily. (Patient taking differently: Take 40 mg by mouth every evening. )  . vitamin B-12 (CYANOCOBALAMIN) 500 MCG tablet Take 500 mcg by mouth daily.   No current facility-administered medications for this visit. (Other)      REVIEW OF SYSTEMS: ROS  Positive for: Gastrointestinal, Eyes   Negative for: Constitutional, Neurological, Skin, Genitourinary, Musculoskeletal, HENT, Endocrine, Cardiovascular, Respiratory, Psychiatric, Allergic/Imm, Heme/Lymph   Last edited by Roselee Nova D, COT on 05/26/2019  9:15 AM. (History)       ALLERGIES No Known Allergies  PAST MEDICAL HISTORY Past Medical History:  Diagnosis Date  . Acid reflux 12/30/2006  . Adult hypothyroidism 01/13/2007  . Allergic rhinitis 09/07/2014  . Anxiety 12/10/2014  . Bronchitis 05/17/2015  . Cancer of cheek ([place]) 07/04/2007  . Depression, neurotic 12/30/2006  . Hyperlipidemia   . Prediabetes 08/11/2016  . RAD  (reactive airway disease) 09/07/2014  . Small bowel obstruction ([place]) 01/31/2017   Past Surgical History:  Procedure Laterality Date  . ABDOMINAL HYSTERECTOMY  1988   heavy, irregular menses; ovaries intact  . AUGMENTATION MAMMAPLASTY Bilateral    silicone  . BREAST SURGERY  1970   augmentation  . CATARACT EXTRACTION Bilateral 2013   Dr. Talbert Forest.  [place].  OD-Distance.  Marland Kitchen EYE SURGERY     cataract extraction  . EYE SURGERY  2007   LASIK  . MEMBRANE PEEL Left 05/25/2019   Procedure: MEMBRANE PEEL;  Surgeon: Bernarda Caffey, MD;  Location: [facility];  Service: Ophthalmology;  Laterality: Left;  . PARS PLANA VITRECTOMY Left 05/25/2019   Procedure: PARS PLANA VITRECTOMY WITH 25 GAUGE;  Surgeon: Bernarda Caffey, MD;  Location: [facility];  Service: Ophthalmology;  Laterality: Left;  . TONSILLECTOMY AND ADENOIDECTOMY  1950  . TUBAL LIGATION    . YAG LASER APPLICATION Left [id number]   Dr. Bernarda Caffey    FAMILY HISTORY Family History  Problem Relation Age of Onset  . Stroke Mother   . CAD Mother   . Heart attack Father   . Hyperthyroidism Sister   . Heart Problems Brother   . Cancer Brother        skin  . CAD Brother        CABG    SOCIAL HISTORY Social History   Tobacco Use  . Smoking status: Former Smoker    Packs/day: 1.00    Years: 20.00    Pack years: 20.00    Quit date: 05/12/2003    Years since quitting: 16.0  . Smokeless tobacco: Never Used  Substance Use Topics  . Alcohol use: No    Alcohol/week: 0.0 standard drinks  . Drug use: No         OPHTHALMIC EXAM:  Base Eye Exam    Visual Acuity (Snellen - Linear)      Right Left   Dist [place] 20/30 +2 CF at face   Dist ph [place] 20/25 +1 NI       Tonometry (Tonopen, 9:21 AM)      Right Left   Pressure 15 17       Pupils      Dark Light Shape React APD   Right 4 3 Round Slow None   Left 7 7 Round None None       Neuro/Psych    Oriented x3: Yes   Mood/Affect: Normal       Dilation    Left eye: 1.0% Mydriacyl, 2.5%  Phenylephrine @ 9:21 AM        Slit Lamp and Fundus Exam    Slit Lamp Exam      Right Left   Lids/Lashes Dermatochalasis - upper lid, mild Meibomian gland dysfunction Dermatochalasis - upper lid, mild Meibomian gland dysfunction   Conjunctiva/Sclera Trace nasal Pinguecula Trace nasal Pinguecula, sutures intact, trace Subconjunctival  hemorrhage IT   Cornea Arcus Arcus, 1+ Punctate epithelial erosions   Anterior Chamber Deep and quiet Deep, 1+ Cell   Iris Round and dilated Round and moderately dilated   Lens PC IOL in good position, trace, non-central Posterior capsular opacification PC IOL in good position with open PC   Vitreous Vitreous syneresis post vitrectomy, good gas fill       Fundus Exam      Right Left   Disc  trace pallor, Sharp rim   C/D Ratio 0.3 0.3   Macula  Flat under gas, ERM gone   Vessels  Vascular attenuation, Tortuous   Periphery  Attached, early laser changes 360 periphery          IMAGING AND PROCEDURES  Imaging and Procedures for @TODAY @           ASSESSMENT/PLAN:    ICD-10-CM   1. Epiretinal membrane (ERM) of both eyes  H35.373   2. Retinal edema  H35.81   3. Pseudophakia of both eyes  Z96.1   4. Bilateral posterior capsular opacification  H26.493     1,2. Epiretinal membrane, OU  - OD: mild ERM, greatest nasally  - OS: with pucker and PRF  - OD asymptomatic, no metamorphopsia  - no indication for surgery at this time -- monitor  - OS +metamorophopsia with reading  - now POD1 OS s/p 25g PPV/TissueBlue stain/MP/20% SF6, 01.14.21             - doing well this morning             - IOP okay at 17             - cont   PF 4x/day OS                          zymaxid QID OS                          Atropine qdaily OS    Brimonidine qdaily OS                         PSO ung QID OS              - cont face down positioning x3 days then can decrease positioning to 50% of time; avoid laying flat on back              - eye shield when sleeping               - post op drop and positioning instructions reviewed              - tylenol/ibuprofen for pain  - f/u 1 week  3. Pseudophakia OU  - s/p CE/IOL OU (Dr Talbert Forest, 2013) -- monovision with OS being near eye  - beautiful surgeries, doing well  - monitor  4. PCO OU  - OD: trace, non-central  - OS s/p YAG cap 11.13.20 - nice opening in place  - BCVA OS improved from 20/100 to 20/80  - stable, monitor   Ophthalmic Meds Ordered this visit:  No orders of the defined types were placed in this encounter.      Return in about 1 week (around 06/02/2019) for f/u ERM OS, POV, DFE.  There are no Patient Instructions on file for this visit.   Explained the diagnoses, plan, and follow up with  the patient and they expressed understanding.  Patient expressed understanding of the importance of proper follow up care.   This document serves as a record of services personally performed by Gardiner Sleeper, MD, PhD. It was created on their behalf by Ernest Mallick, OA, an ophthalmic assistant. The creation of this record is the provider's dictation and/or activities during the visit.    Electronically signed by: Ernest Mallick, OA 01.12.2021 2:23 PM  Gardiner Sleeper, M.D., Ph.D. Diseases & Surgery of the Retina and Vitreous [facility]  I have reviewed the above documentation for accuracy and completeness, and I agree with the above. Gardiner Sleeper, M.D., Ph.D. 05/26/19 2:23 PM   Abbreviations: M myopia (nearsighted); A astigmatism; H hyperopia (farsighted); P presbyopia; Mrx spectacle prescription;  CTL contact lenses; OD right eye; OS left eye; OU both eyes  XT exotropia; ET esotropia; PEK punctate epithelial keratitis; PEE punctate epithelial erosions; DES dry eye syndrome; MGD meibomian gland dysfunction; ATs artificial tears; PFAT's preservative free artificial tears; [place] nuclear sclerotic cataract; PSC posterior subcapsular cataract; ERM epi-retinal membrane; PVD  posterior vitreous detachment; RD retinal detachment; DM diabetes mellitus; DR diabetic retinopathy; NPDR non-proliferative diabetic retinopathy; PDR proliferative diabetic retinopathy; CSME clinically significant macular edema; DME diabetic macular edema; dbh dot blot hemorrhages; CWS cotton wool spot; POAG primary open angle glaucoma; C/D cup-to-disc ratio; HVF humphrey visual field; GVF goldmann visual field; OCT optical coherence tomography; IOP intraocular pressure; BRVO Branch retinal vein occlusion; CRVO central retinal vein occlusion; CRAO central retinal artery occlusion; BRAO branch retinal artery occlusion; RT retinal tear; SB scleral buckle; PPV pars plana vitrectomy; VH Vitreous hemorrhage; PRP panretinal laser photocoagulation; IVK intravitreal kenalog; VMT vitreomacular traction; MH Macular hole;  NVD neovascularization of the disc; NVE neovascularization elsewhere; AREDS age related eye disease study; ARMD age related macular degeneration; POAG primary open angle glaucoma; EBMD epithelial/anterior basement membrane dystrophy; ACIOL anterior chamber intraocular lens; IOL intraocular lens; PCIOL posterior chamber intraocular lens; Phaco/IOL phacoemulsification with intraocular lens placement; [place] photorefractive keratectomy; LASIK laser assisted in situ keratomileusis; HTN hypertension; DM diabetes mellitus; COPD chronic obstructive pulmonary disease

## 2019-05-24 ENCOUNTER — Telehealth: Payer: Self-pay

## 2019-05-24 LAB — CBC WITH DIFFERENTIAL/PLATELET
Basophils Absolute: 0 10*3/uL (ref 0.0–0.2)
Basos: 0 %
EOS (ABSOLUTE): 0.2 10*3/uL (ref 0.0–0.4)
Eos: 3 %
Hematocrit: 40.6 % (ref 34.0–46.6)
Hemoglobin: 13 g/dL (ref 11.1–15.9)
Immature Grans (Abs): 0 10*3/uL (ref 0.0–0.1)
Immature Granulocytes: 0 %
Lymphocytes Absolute: 2 10*3/uL (ref 0.7–3.1)
Lymphs: 35 %
MCH: 30.4 pg (ref 26.6–33.0)
MCHC: 32 g/dL (ref 31.5–35.7)
MCV: 95 fL (ref 79–97)
Monocytes Absolute: 0.5 10*3/uL (ref 0.1–0.9)
Monocytes: 9 %
Neutrophils Absolute: 3.1 10*3/uL (ref 1.4–7.0)
Neutrophils: 53 %
Platelets: 253 10*3/uL (ref 150–450)
RBC: 4.27 x10E6/uL (ref 3.77–5.28)
RDW: 12 % (ref 11.7–15.4)
WBC: 5.9 10*3/uL (ref 3.4–10.8)

## 2019-05-24 LAB — CERVICOVAGINAL ANCILLARY ONLY
Bacterial Vaginitis (gardnerella): NEGATIVE
Candida Glabrata: NEGATIVE
Candida Vaginitis: NEGATIVE
Chlamydia: NEGATIVE
Comment: NEGATIVE
Comment: NEGATIVE
Comment: NEGATIVE
Comment: NEGATIVE
Comment: NEGATIVE
Comment: NORMAL
Neisseria Gonorrhea: NEGATIVE
Trichomonas: NEGATIVE

## 2019-05-24 LAB — COMPREHENSIVE METABOLIC PANEL
ALT: 8 IU/L (ref 0–32)
AST: 18 IU/L (ref 0–40)
Albumin/Globulin Ratio: 2 (ref 1.2–2.2)
Albumin: 4.3 g/dL (ref 3.7–4.7)
Alkaline Phosphatase: 42 IU/L (ref 39–117)
BUN/Creatinine Ratio: 18 (ref 12–28)
BUN: 13 mg/dL (ref 8–27)
Bilirubin Total: <0.2 mg/dL (ref 0.0–1.2)
CO2: 23 mmol/L (ref 20–29)
Calcium: 9.5 mg/dL (ref 8.7–10.3)
Chloride: 102 mmol/L (ref 96–106)
Creatinine, Ser: 0.73 mg/dL (ref 0.57–1.00)
GFR calc Af Amer: 94 mL/min/{1.73_m2} (ref >59)
GFR calc non Af Amer: 82 mL/min/{1.73_m2} (ref >59)
Globulin, Total: 2.1 g/dL (ref 1.5–4.5)
Glucose: 87 mg/dL (ref 65–99)
Potassium: 4.1 mmol/L (ref 3.5–5.2)
Sodium: 138 mmol/L (ref 134–144)
Total Protein: 6.4 g/dL (ref 6.0–8.5)

## 2019-05-24 LAB — HEMOGLOBIN A1C
Est. average glucose Bld gHb Est-mCnc: 111 mg/dL
Hgb A1c MFr Bld: 5.5 % (ref 4.8–5.6)

## 2019-05-24 NOTE — Telephone Encounter (Signed)
Patient advised of lab results

## 2019-05-24 NOTE — Telephone Encounter (Signed)
Patient advised on lab results. 

## 2019-05-25 ENCOUNTER — Encounter (HOSPITAL_COMMUNITY)
Admission: RE | Disposition: A | Discharge status: Home / Self Care | Payer: Self-pay | Source: Home / Self Care | Attending: Ophthalmology

## 2019-05-25 ENCOUNTER — Ambulatory Visit (HOSPITAL_COMMUNITY): Payer: Medicare HMO | Admitting: Anesthesiology

## 2019-05-25 ENCOUNTER — Ambulatory Visit (HOSPITAL_COMMUNITY)
Admission: RE | Admit: 2019-05-25 | Discharge: 2019-05-25 | Disposition: A | Discharge status: Home / Self Care | Payer: Medicare HMO | Attending: Ophthalmology | Admitting: Ophthalmology

## 2019-05-25 ENCOUNTER — Other Ambulatory Visit: Payer: Self-pay

## 2019-05-25 ENCOUNTER — Encounter (HOSPITAL_COMMUNITY): Payer: Self-pay | Admitting: Ophthalmology

## 2019-05-25 DIAGNOSIS — H3589 Other specified retinal disorders: Secondary | ICD-10-CM | POA: Insufficient documentation

## 2019-05-25 DIAGNOSIS — H35372 Puckering of macula, left eye: Secondary | ICD-10-CM | POA: Diagnosis not present

## 2019-05-25 DIAGNOSIS — Z9842 Cataract extraction status, left eye: Secondary | ICD-10-CM | POA: Diagnosis not present

## 2019-05-25 DIAGNOSIS — Z90710 Acquired absence of both cervix and uterus: Secondary | ICD-10-CM | POA: Diagnosis not present

## 2019-05-25 DIAGNOSIS — E785 Hyperlipidemia, unspecified: Secondary | ICD-10-CM | POA: Insufficient documentation

## 2019-05-25 DIAGNOSIS — R69 Illness, unspecified: Secondary | ICD-10-CM | POA: Diagnosis not present

## 2019-05-25 DIAGNOSIS — Z8349 Family history of other endocrine, nutritional and metabolic diseases: Secondary | ICD-10-CM | POA: Insufficient documentation

## 2019-05-25 DIAGNOSIS — Z9841 Cataract extraction status, right eye: Secondary | ICD-10-CM | POA: Diagnosis not present

## 2019-05-25 DIAGNOSIS — E039 Hypothyroidism, unspecified: Secondary | ICD-10-CM | POA: Insufficient documentation

## 2019-05-25 DIAGNOSIS — Z87891 Personal history of nicotine dependence: Secondary | ICD-10-CM | POA: Diagnosis not present

## 2019-05-25 DIAGNOSIS — F419 Anxiety disorder, unspecified: Secondary | ICD-10-CM | POA: Insufficient documentation

## 2019-05-25 DIAGNOSIS — Z823 Family history of stroke: Secondary | ICD-10-CM | POA: Insufficient documentation

## 2019-05-25 DIAGNOSIS — J45909 Unspecified asthma, uncomplicated: Secondary | ICD-10-CM | POA: Insufficient documentation

## 2019-05-25 DIAGNOSIS — J309 Allergic rhinitis, unspecified: Secondary | ICD-10-CM | POA: Diagnosis not present

## 2019-05-25 DIAGNOSIS — K219 Gastro-esophageal reflux disease without esophagitis: Secondary | ICD-10-CM | POA: Insufficient documentation

## 2019-05-25 DIAGNOSIS — Z809 Family history of malignant neoplasm, unspecified: Secondary | ICD-10-CM | POA: Insufficient documentation

## 2019-05-25 DIAGNOSIS — Z8249 Family history of ischemic heart disease and other diseases of the circulatory system: Secondary | ICD-10-CM | POA: Insufficient documentation

## 2019-05-25 DIAGNOSIS — H43312 Vitreous membranes and strands, left eye: Secondary | ICD-10-CM | POA: Insufficient documentation

## 2019-05-25 DIAGNOSIS — F329 Major depressive disorder, single episode, unspecified: Secondary | ICD-10-CM | POA: Insufficient documentation

## 2019-05-25 HISTORY — PX: PARS PLANA VITRECTOMY: SHX2166

## 2019-05-25 HISTORY — PX: MEMBRANE PEEL: SHX5967

## 2019-05-25 LAB — CULTURE, URINE COMPREHENSIVE

## 2019-05-25 SURGERY — PARS PLANA VITRECTOMY WITH 25 GAUGE
Anesthesia: General | Site: Eye | Laterality: Left

## 2019-05-25 MED ORDER — BRIMONIDINE TARTRATE 0.2 % OP SOLN
OPHTHALMIC | Status: DC | PRN
  Administered 2019-05-25: 1 [drp] via OPHTHALMIC

## 2019-05-25 MED ORDER — CEFTAZIDIME 1 G IJ SOLR
INTRAMUSCULAR | Status: AC
  Filled 2019-05-25: qty 1

## 2019-05-25 MED ORDER — PROPARACAINE HCL 0.5 % OP SOLN
1.0000 [drp] | OPHTHALMIC | Status: AC | PRN
  Administered 2019-05-25 (×3): 1 [drp] via OPHTHALMIC
  Filled 2019-05-25: qty 15

## 2019-05-25 MED ORDER — CARBACHOL 0.01 % IO SOLN
INTRAOCULAR | Status: AC
  Filled 2019-05-25: qty 1.5

## 2019-05-25 MED ORDER — BRILLIANT BLUE G 0.025 % IO SOSY
0.5000 mL | PREFILLED_SYRINGE | INTRAOCULAR | Status: AC
  Administered 2019-05-25: .5 mL via INTRAVITREAL
  Filled 2019-05-25: qty 0.5

## 2019-05-25 MED ORDER — SODIUM CHLORIDE 0.9 % IV SOLN: INTRAVENOUS | Status: DC | PRN

## 2019-05-25 MED ORDER — ATROPINE SULFATE 1 % OP SOLN
OPHTHALMIC | Status: AC
  Filled 2019-05-25: qty 5

## 2019-05-25 MED ORDER — BSS IO SOLN
INTRAOCULAR | Status: DC | PRN
  Administered 2019-05-25: 15 mL via INTRAOCULAR

## 2019-05-25 MED ORDER — PREDNISOLONE ACETATE 1 % OP SUSP
OPHTHALMIC | Status: AC
  Filled 2019-05-25: qty 5

## 2019-05-25 MED ORDER — LIDOCAINE 2% (20 MG/ML) 5 ML SYRINGE
INTRAMUSCULAR | Status: DC | PRN
  Administered 2019-05-25: 40 mg via INTRAVENOUS

## 2019-05-25 MED ORDER — PHENYLEPHRINE 40 MCG/ML (10ML) SYRINGE FOR IV PUSH (FOR BLOOD PRESSURE SUPPORT)
PREFILLED_SYRINGE | INTRAVENOUS | Status: DC | PRN
  Administered 2019-05-25 (×3): 80 ug via INTRAVENOUS

## 2019-05-25 MED ORDER — PROPOFOL 10 MG/ML IV BOLUS
INTRAVENOUS | Status: AC
  Filled 2019-05-25: qty 20

## 2019-05-25 MED ORDER — DORZOLAMIDE HCL-TIMOLOL MAL 2-0.5 % OP SOLN
OPHTHALMIC | Status: DC | PRN
  Administered 2019-05-25: 1 [drp] via OPHTHALMIC

## 2019-05-25 MED ORDER — ONDANSETRON HCL 4 MG/2ML IJ SOLN
INTRAMUSCULAR | Status: DC | PRN
  Administered 2019-05-25: 4 mg via INTRAVENOUS

## 2019-05-25 MED ORDER — OXYCODONE HCL 5 MG PO TABS
ORAL_TABLET | ORAL | Status: AC
  Filled 2019-05-25: qty 1

## 2019-05-25 MED ORDER — GATIFLOXACIN 0.5 % OP SOLN
OPHTHALMIC | Status: AC
  Filled 2019-05-25: qty 2.5

## 2019-05-25 MED ORDER — GATIFLOXACIN 0.5 % OP SOLN OPTIME - NO CHARGE
OPHTHALMIC | Status: DC | PRN
  Administered 2019-05-25: 1 [drp] via OPHTHALMIC

## 2019-05-25 MED ORDER — TRIAMCINOLONE ACETONIDE 40 MG/ML IJ SUSP
INTRAMUSCULAR | Status: AC
  Filled 2019-05-25: qty 5

## 2019-05-25 MED ORDER — PHENYLEPHRINE 40 MCG/ML (10ML) SYRINGE FOR IV PUSH (FOR BLOOD PRESSURE SUPPORT)
PREFILLED_SYRINGE | INTRAVENOUS | Status: AC
  Filled 2019-05-25: qty 10

## 2019-05-25 MED ORDER — BUPIVACAINE HCL (PF) 0.75 % IJ SOLN
INTRAMUSCULAR | Status: AC
  Filled 2019-05-25: qty 10

## 2019-05-25 MED ORDER — TRIAMCINOLONE ACETONIDE 40 MG/ML IJ SUSP
INTRAMUSCULAR | Status: DC | PRN
  Administered 2019-05-25: 40 mg via INTRAMUSCULAR

## 2019-05-25 MED ORDER — LIDOCAINE HCL (PF) 2 % IJ SOLN
INTRAMUSCULAR | Status: AC
  Filled 2019-05-25: qty 10

## 2019-05-25 MED ORDER — NA CHONDROIT SULF-NA HYALURON 40-30 MG/ML IO SOLN
INTRAOCULAR | Status: AC
  Filled 2019-05-25: qty 1

## 2019-05-25 MED ORDER — EPINEPHRINE PF 1 MG/ML IJ SOLN
INTRAOCULAR | Status: DC | PRN
  Administered 2019-05-25: 500 mL

## 2019-05-25 MED ORDER — NA CHONDROIT SULF-NA HYALURON 40-30 MG/ML IO SOLN
INTRAOCULAR | Status: DC | PRN
  Administered 2019-05-25: 0.5 mL via INTRAOCULAR

## 2019-05-25 MED ORDER — ROCURONIUM BROMIDE 10 MG/ML (PF) SYRINGE
PREFILLED_SYRINGE | INTRAVENOUS | Status: AC
  Filled 2019-05-25: qty 10

## 2019-05-25 MED ORDER — SUGAMMADEX SODIUM 200 MG/2ML IV SOLN
INTRAVENOUS | Status: DC | PRN
  Administered 2019-05-25: 110 mg via INTRAVENOUS

## 2019-05-25 MED ORDER — SODIUM CHLORIDE 0.9 % IV SOLN: INTRAVENOUS | Status: DC

## 2019-05-25 MED ORDER — ONDANSETRON HCL 4 MG/2ML IJ SOLN
INTRAMUSCULAR | Status: AC
  Filled 2019-05-25: qty 2

## 2019-05-25 MED ORDER — SODIUM CHLORIDE (PF) 0.9 % IJ SOLN
INTRAMUSCULAR | Status: AC
  Filled 2019-05-25: qty 10

## 2019-05-25 MED ORDER — POLYMYXIN B SULFATE 500000 UNITS IJ SOLR
INTRAMUSCULAR | Status: AC
  Filled 2019-05-25: qty 500000

## 2019-05-25 MED ORDER — BSS PLUS IO SOLN
INTRAOCULAR | Status: AC
  Filled 2019-05-25: qty 500

## 2019-05-25 MED ORDER — FENTANYL CITRATE (PF) 250 MCG/5ML IJ SOLN
INTRAMUSCULAR | Status: AC
  Filled 2019-05-25: qty 5

## 2019-05-25 MED ORDER — FENTANYL CITRATE (PF) 100 MCG/2ML IJ SOLN: 25.0000 ug | INTRAMUSCULAR | Status: DC | PRN

## 2019-05-25 MED ORDER — FENTANYL CITRATE (PF) 250 MCG/5ML IJ SOLN
INTRAMUSCULAR | Status: DC | PRN
  Administered 2019-05-25 (×2): 50 ug via INTRAVENOUS
  Administered 2019-05-25: 100 ug via INTRAVENOUS
  Administered 2019-05-25: 50 ug via INTRAVENOUS

## 2019-05-25 MED ORDER — BRIMONIDINE TARTRATE 0.2 % OP SOLN
OPHTHALMIC | Status: AC
  Filled 2019-05-25: qty 5

## 2019-05-25 MED ORDER — DEXAMETHASONE SODIUM PHOSPHATE 10 MG/ML IJ SOLN
INTRAMUSCULAR | Status: AC
  Filled 2019-05-25: qty 1

## 2019-05-25 MED ORDER — PHENYLEPHRINE HCL-NACL 10-0.9 MG/250ML-% IV SOLN
INTRAVENOUS | Status: DC | PRN
  Administered 2019-05-25: 50 ug/min via INTRAVENOUS

## 2019-05-25 MED ORDER — ONDANSETRON HCL 4 MG/2ML IJ SOLN: 4.0000 mg | Freq: Four times a day (QID) | INTRAMUSCULAR | Status: DC | PRN

## 2019-05-25 MED ORDER — ACETAZOLAMIDE SODIUM 500 MG IJ SOLR
INTRAMUSCULAR | Status: AC
  Filled 2019-05-25: qty 500

## 2019-05-25 MED ORDER — OXYCODONE HCL 5 MG PO TABS
5.0000 mg | ORAL_TABLET | Freq: Once | ORAL | Status: AC | PRN
  Administered 2019-05-25: 5 mg via ORAL

## 2019-05-25 MED ORDER — TROPICAMIDE 1 % OP SOLN
1.0000 [drp] | OPHTHALMIC | Status: AC | PRN
  Administered 2019-05-25 (×3): 1 [drp] via OPHTHALMIC
  Filled 2019-05-25: qty 15

## 2019-05-25 MED ORDER — DORZOLAMIDE HCL-TIMOLOL MAL 2-0.5 % OP SOLN
OPHTHALMIC | Status: AC
  Filled 2019-05-25: qty 10

## 2019-05-25 MED ORDER — STERILE WATER FOR INJECTION IJ SOLN
INTRAMUSCULAR | Status: DC | PRN
  Administered 2019-05-25: 20 mL

## 2019-05-25 MED ORDER — PROPOFOL 10 MG/ML IV BOLUS
INTRAVENOUS | Status: DC | PRN
  Administered 2019-05-25: 100 mg via INTRAVENOUS

## 2019-05-25 MED ORDER — STERILE WATER FOR INJECTION IJ SOLN
INTRAMUSCULAR | Status: AC
  Filled 2019-05-25: qty 10

## 2019-05-25 MED ORDER — PHENYLEPHRINE HCL 10 % OP SOLN
1.0000 [drp] | OPHTHALMIC | Status: AC | PRN
  Administered 2019-05-25 (×3): 1 [drp] via OPHTHALMIC
  Filled 2019-05-25: qty 5

## 2019-05-25 MED ORDER — BSS IO SOLN
INTRAOCULAR | Status: AC
  Filled 2019-05-25: qty 15

## 2019-05-25 MED ORDER — INDOCYANINE GREEN 25 MG IV SOLR
INTRAVENOUS | Status: AC
  Filled 2019-05-25: qty 25

## 2019-05-25 MED ORDER — OXYCODONE HCL 5 MG/5ML PO SOLN: 5.0000 mg | Freq: Once | ORAL | Status: AC | PRN

## 2019-05-25 MED ORDER — ROCURONIUM BROMIDE 10 MG/ML (PF) SYRINGE
PREFILLED_SYRINGE | INTRAVENOUS | Status: DC | PRN
  Administered 2019-05-25 (×2): 10 mg via INTRAVENOUS
  Administered 2019-05-25: 40 mg via INTRAVENOUS
  Administered 2019-05-25: 10 mg via INTRAVENOUS

## 2019-05-25 MED ORDER — LIDOCAINE HCL (PF) 4 % IJ SOLN
INTRAMUSCULAR | Status: AC
  Filled 2019-05-25: qty 5

## 2019-05-25 MED ORDER — PREDNISOLONE ACETATE 1 % OP SUSP
OPHTHALMIC | Status: DC | PRN
  Administered 2019-05-25: 1 [drp] via OPHTHALMIC

## 2019-05-25 MED ORDER — DEXTROSE 5 % IV SOLN
INTRAVENOUS | Status: AC
  Filled 2019-05-25: qty 100

## 2019-05-25 MED ORDER — TOBRAMYCIN-DEXAMETHASONE 0.3-0.1 % OP OINT
TOPICAL_OINTMENT | OPHTHALMIC | Status: AC
  Filled 2019-05-25: qty 3.5

## 2019-05-25 MED ORDER — LABETALOL HCL 5 MG/ML IV SOLN
INTRAVENOUS | Status: DC | PRN
  Administered 2019-05-25: 5 mg via INTRAVENOUS

## 2019-05-25 MED ORDER — BACITRACIN-POLYMYXIN B 500-10000 UNIT/GM OP OINT
TOPICAL_OINTMENT | OPHTHALMIC | Status: AC
  Filled 2019-05-25: qty 3.5

## 2019-05-25 MED ORDER — ATROPINE SULFATE 1 % OP SOLN
1.0000 [drp] | OPHTHALMIC | Status: DC | PRN
  Administered 2019-05-25 (×2): 1 [drp] via OPHTHALMIC
  Filled 2019-05-25: qty 5

## 2019-05-25 MED ORDER — LIDOCAINE 2% (20 MG/ML) 5 ML SYRINGE
INTRAMUSCULAR | Status: AC
  Filled 2019-05-25: qty 5

## 2019-05-25 MED ORDER — EPINEPHRINE PF 1 MG/ML IJ SOLN
INTRAMUSCULAR | Status: AC
  Filled 2019-05-25: qty 1

## 2019-05-25 MED ORDER — BACITRACIN-POLYMYXIN B 500-10000 UNIT/GM OP OINT
TOPICAL_OINTMENT | OPHTHALMIC | Status: DC | PRN
  Administered 2019-05-25: 1 via OPHTHALMIC

## 2019-05-25 SURGICAL SUPPLY — 74 items
APL SWBSTK 6 STRL LF DISP (MISCELLANEOUS) ×4
APPLICATOR COTTON TIP 6 STRL (MISCELLANEOUS) ×4 IMPLANT
APPLICATOR COTTON TIP 6IN STRL (MISCELLANEOUS) ×8
BALL CTTN LRG ABS STRL LF (GAUZE/BANDAGES/DRESSINGS) ×3
BAND WRIST GAS GREEN (MISCELLANEOUS) IMPLANT
BLADE EYE CATARACT 19 1.4 BEAV (BLADE) IMPLANT
BNDG EYE OVAL (GAUZE/BANDAGES/DRESSINGS) ×2 IMPLANT
CABLE BIPOLOR RESECTION CORD (MISCELLANEOUS) ×2 IMPLANT
CANNULA ANT CHAM MAIN (OPHTHALMIC RELATED) IMPLANT
CANNULA FLEX TIP 25G (CANNULA) ×2 IMPLANT
CANNULA TROCAR 23 GA VLV (OPHTHALMIC) IMPLANT
CANNULA TROCAR 23G VLV (OPHTHALMIC) IMPLANT
CANNULA VLV SOFT TIP 25G (OPHTHALMIC) IMPLANT
CANNULA VLV SOFT TIP 25GA (OPHTHALMIC) IMPLANT
CLSR STERI-STRIP ANTIMIC 1/2X4 (GAUZE/BANDAGES/DRESSINGS) ×2 IMPLANT
COTTONBALL LRG STERILE PKG (GAUZE/BANDAGES/DRESSINGS) ×6 IMPLANT
COVER WAND RF STERILE (DRAPES) ×1 IMPLANT
DRAPE MICROSCOPE LEICA 46X105 (MISCELLANEOUS) ×2 IMPLANT
DRAPE OPHTHALMIC 77X100 STRL (CUSTOM PROCEDURE TRAY) ×2 IMPLANT
ERASER HMR WETFIELD 23G BP (MISCELLANEOUS) IMPLANT
FILTER BLUE MILLIPORE (MISCELLANEOUS) IMPLANT
FILTER STRAW FLUID ASPIR (MISCELLANEOUS) IMPLANT
FORCEPS GRIESHABER ILM 25G A (INSTRUMENTS) ×1 IMPLANT
FORCEPS GRIESHABER MAX 25G (MISCELLANEOUS) ×1 IMPLANT
GAS AUTO FILL CONSTEL (OPHTHALMIC) ×2
GAS AUTO FILL CONSTELLATION (OPHTHALMIC) IMPLANT
GAS WRIST BAND GREEN (MISCELLANEOUS)
GLOVE BIO SURGEON STRL SZ7.5 (GLOVE) ×4 IMPLANT
GLOVE BIOGEL M 7.0 STRL (GLOVE) ×2 IMPLANT
GOWN STRL REUS W/ TWL LRG LVL3 (GOWN DISPOSABLE) ×2 IMPLANT
GOWN STRL REUS W/ TWL XL LVL3 (GOWN DISPOSABLE) ×1 IMPLANT
GOWN STRL REUS W/TWL LRG LVL3 (GOWN DISPOSABLE) ×4
GOWN STRL REUS W/TWL XL LVL3 (GOWN DISPOSABLE) ×2
KIT BASIN OR (CUSTOM PROCEDURE TRAY) ×2 IMPLANT
KIT PERFLUORON PROCEDURE 5ML (MISCELLANEOUS) IMPLANT
LENS MACULAR ASPHERIC CONSTEL (OPHTHALMIC) IMPLANT
LENS VITRECTOMY FLAT OCLR DISP (MISCELLANEOUS) ×1 IMPLANT
LOOP FINESSE 25 GA (MISCELLANEOUS) IMPLANT
MICROPICK 25G (MISCELLANEOUS)
NDL 18GX1X1/2 (RX/OR ONLY) (NEEDLE) ×1 IMPLANT
NDL 25GX 5/8IN NON SAFETY (NEEDLE) ×3 IMPLANT
NDL HYPO 30X.5 LL (NEEDLE) ×2 IMPLANT
NDL PRECISIONGLIDE 27X1.5 (NEEDLE) IMPLANT
NEEDLE 18GX1X1/2 (RX/OR ONLY) (NEEDLE) ×6 IMPLANT
NEEDLE 25GX 5/8IN NON SAFETY (NEEDLE) ×4 IMPLANT
NEEDLE HYPO 30X.5 LL (NEEDLE) IMPLANT
NEEDLE PRECISIONGLIDE 27X1.5 (NEEDLE) IMPLANT
NS IRRIG 1000ML POUR BTL (IV SOLUTION) ×1 IMPLANT
PACK VITRECTOMY CUSTOM (CUSTOM PROCEDURE TRAY) ×2 IMPLANT
PAD ARMBOARD 7.5X6 YLW CONV (MISCELLANEOUS) ×4 IMPLANT
PAK PIK VITRECTOMY CVS 25GA (OPHTHALMIC) ×2 IMPLANT
PENCIL BIPOLAR 25GA STR DISP (OPHTHALMIC RELATED) ×2 IMPLANT
PICK MICROPICK 25G (MISCELLANEOUS) IMPLANT
PROBE DIATHERMY DSP 27GA (MISCELLANEOUS) IMPLANT
PROBE ENDO DIATHERMY 25G (MISCELLANEOUS) IMPLANT
PROBE LASER ILLUM FLEX CVD 25G (OPHTHALMIC) ×1 IMPLANT
REPL STRA BRUSH NDL (NEEDLE) IMPLANT
REPL STRA BRUSH NEEDLE (NEEDLE) IMPLANT
RESERVOIR BACK FLUSH (MISCELLANEOUS) IMPLANT
RETRACTOR IRIS FLEX 25G GRIESH (INSTRUMENTS) IMPLANT
SET INJECTOR OIL FLUID CONSTEL (OPHTHALMIC) IMPLANT
SOL ANTI FOG 6CC (MISCELLANEOUS) IMPLANT
SOLUTION ANTI FOG 6CC (MISCELLANEOUS) ×1
SPONGE SURGIFOAM ABS GEL 12-7 (HEMOSTASIS) IMPLANT
STOPCOCK 4 WAY LG BORE MALE ST (IV SETS) IMPLANT
SUT VICRYL 7 0 TG140 8 (SUTURE) ×2 IMPLANT
SYR 10ML LL (SYRINGE) ×4 IMPLANT
SYR 20ML LL LF (SYRINGE) ×1 IMPLANT
SYR 5ML LL (SYRINGE) ×2 IMPLANT
SYR BULB 3OZ (MISCELLANEOUS) ×1 IMPLANT
SYR TB 1ML LUER SLIP (SYRINGE) ×4 IMPLANT
TOWEL GREEN STERILE FF (TOWEL DISPOSABLE) ×2 IMPLANT
TUBING HIGH PRESS EXTEN 6IN (TUBING) ×1 IMPLANT
WATER STERILE IRR 1000ML POUR (IV SOLUTION) ×2 IMPLANT

## 2019-05-25 NOTE — Op Note (Signed)
Date of procedure:  05/15/2019   Surgeon: Bernarda Caffey, MD, PhD  Assistant: Ernest Mallick, Ophthalmic Assistant   Pre-operative Diagnoses:  Epiretinal membrane / Preretinal fibrosis, left eye   Post-operative diagnosis:  same   Anesthesia: General   Procedure:   1. 25 gauge pars plana vitrectomy, Left Eye 2. Tissue Blue staining, Left Eye 3. Epiretinal and inner limiting membrane peel, Left Eye 4. Endolaser, Left Eye  5. Fluid-Air Exchange, Left Eye 6. Injection 20% SF6, Left Eye    Indications for procedure: This is a 74 yo F with a history of decreased vision with distortion OS caused by an epiretinal membrane of the left eye. After discussing the risks, benefits, and alternatives to surgery, the patient electively decided to undergo surgical repair and informed consent was obtained. The surgery was an attempt to remove the epiretinal and inner limiting membranes causing her visual symptoms and to potentially improve the vision within the reasonable expectations of the surgeon.    Procedure in Detail:    The patient was met in the pre-operative holding area where their identification data was verified.  It was noted that there was a signed, informed consent in the chart and the Left Eye eye was verbally verified by the patient as the operative eye and was marked with a marking pen. The patient was then taken to the operating room and placed in the supine position. General endotracheal anesthesia was induced.  The eye was then prepped with 5% betadine and draped in the normal fashion for ophthalmic surgery. The microscope was draped and swung into position, and a secondary time-out was performed to identify the correct patient, eyes, procedures, and any allergies.   A 25 gauge trocar was inserted in a 30-45 degrees fashion into the inferotemporal quadrant 3.5 mm posterior to the limbus in this pseudophakic patient. Correct positioning within the vitreous was verified externally with the  light pipe. The infusion was then connected to the cannula and BSS infusion was commenced.  Additional ports were placed in the superonasal and superotemporal quadrants. Viscoat was placed on the cornea and a direct anterior vitrectomy was performed using the light pipe and cutter. The BIOM was then swung into place and used to visualize the posterior segment. The patient had a significant epiretinal membrane with some preretinal fibrotic elements and some retinal stria / puckering.  A posterior vitreous detachment was confirmed via suction over the optic disc. Kenalog was also used to aid in confirming the PVD and identifying residual cortical vitreous. At this time, a thorough core and peripheral vitrectomy was performed under BIOM widefield visualization using the light pipe and cutter.  Next, Tissue Blue was used to stain the ILM and counter stain the central ERM. A macular contact lens was placed on the eye. End-grasping ILM forceps, MaxGrip forceps, and the vitrectomy probe were used to meticulously peel and remove the ILM and overlying ERM en bloc from the entire surface of the macula as was deemed safe.  The wide angle viewing system was brought back into position to complete a peripheral shave of the vitreous base with scleral depression. Of note, scleral depression revealed some scattered patches of lattice with atrophic holes in the far periphery, greatest inferiorly. Thus, prophylactic endolaser retinopexy was applied 360 just posterior to the ora. A fluid air exchange was then performed.    The superotemporal port was then removed and sutured with 7-0 vicryl, there was no leakage. 20% SF6 gas was connected to the infusion line and  gas was injected into the posterior segment while venting air through the superonasal trocar using the extrusion cannula. Once a full, 40cc of gas was vented through the eye, the infusion port and venting ports were removed and they were sutured with 7-0 vicryl.  There was  no leakage from the sclerotomy sites.   Subconjunctival injections of kefzol + bacitracin + polymixin b and kenalog were then administered, and antibiotic and steroid drops as well as antibiotic ointment were placed in the eye.  The drapes were removed and the eye was patched and shielded.  A green gas bracelet was placed on the patient's wrist. The patient was then taken to the post-operative area for recovery having tolerated the procedure well.  She was instructed to perform face down positioning postoperatively and to follow up in clinic the following morning as scheduled.   Estimate blood lost: none Complications: None

## 2019-05-25 NOTE — Transfer of Care (Signed)
Immediate Anesthesia Transfer of Care Note  Patient: Hailey Cain  Procedure(s) Performed: PARS PLANA VITRECTOMY WITH 25 GAUGE (Left Eye) MEMBRANE PEEL (Left Eye)  Patient Location: PACU  Anesthesia Type:General  Level of Consciousness: awake, alert  and oriented  Airway & Oxygen Therapy: Patient Spontanous Breathing and Patient connected to face mask oxygen  Post-op Assessment: Report given to RN, Post -op Vital signs reviewed and stable and Patient moving all extremities  Post vital signs: Reviewed and stable  Last Vitals:  Vitals Value Taken Time  BP 108/67 05/25/19 1345  Temp 37.6 C 05/25/19 1345  Pulse 64 05/25/19 1349  Resp 19 05/25/19 1349  SpO2 86 % 05/25/19 1349  Vitals shown include unvalidated device data.  Last Pain:  Vitals:   05/25/19 1345  PainSc: Asleep         Complications: No apparent anesthesia complications

## 2019-05-25 NOTE — Anesthesia Procedure Notes (Signed)
Procedure Name: Intubation Date/Time: 05/25/2019 11:33 AM Performed by: Amadeo Garnet, CRNA Pre-anesthesia Checklist: Patient identified, Emergency Drugs available, Suction available and Patient being monitored Patient Re-evaluated:Patient Re-evaluated prior to induction Oxygen Delivery Method: Circle system utilized Preoxygenation: Pre-oxygenation with 100% oxygen Induction Type: IV induction Ventilation: Mask ventilation without difficulty Laryngoscope Size: Mac and 3 Grade View: Grade I Tube type: Oral Tube size: 6.5 mm Number of attempts: 1 Airway Equipment and Method: Stylet Placement Confirmation: ETT inserted through vocal cords under direct vision,  positive ETCO2 and breath sounds checked- equal and bilateral Secured at: 22 cm Tube secured with: Tape Dental Injury: Teeth and Oropharynx as per pre-operative assessment

## 2019-05-25 NOTE — Discharge Instructions (Signed)
POSTOPERATIVE INSTRUCTIONS  Your doctor has performed vitreoretinal surgery on you at [facility]. [place] eye patched and shielded until seen by Dr. Coralyn Pear at 915 or as early as 8 AM tomorrow in clinic - Do not use drops until return - [place] - Sleep with belly down or on right side, avoid laying flat on back.    - No strenuous bending, stooping or lifting.  - You may not drive until further notice.  - If your doctor used a gas bubble in your eye during the procedure he will advise you on postoperative positioning. If you have a gas bubble you will be wearing a green bracelet that was applied in the operating room. The green bracelet should stay on as long as the gas bubble is in your eye. While the gas bubble is present you should not fly in an airplane. If you require general anesthesia while the gas bubble is present you must notify your anesthesiologist that an intraocular gas bubble is present so he can take the appropriate precautions.  - Tylenol or any other over-the-counter pain reliever can be used according to your doctor. If more pain medicine is required, your doctor will have a prescription for you.  - You may read, go up and down stairs, and watch television.     Bernarda Caffey, M.D., Ph.D.

## 2019-05-25 NOTE — Anesthesia Preprocedure Evaluation (Signed)
Anesthesia Evaluation  Patient identified by MRN, date of birth, ID band Patient awake    Reviewed: Allergy & Precautions, H&P , NPO status , Patient's Chart, lab work & pertinent test results  Airway Mallampati: II   Neck ROM: full    Dental   Pulmonary former smoker,    breath sounds clear to auscultation       Cardiovascular negative cardio ROS   Rhythm:regular Rate:Normal     Neuro/Psych PSYCHIATRIC DISORDERS Anxiety Depression    GI/Hepatic GERD  ,  Endo/Other  Hypothyroidism   Renal/GU      Musculoskeletal   Abdominal   Peds  Hematology   Anesthesia Other Findings   Reproductive/Obstetrics                             Anesthesia Physical Anesthesia Plan  ASA: III  Anesthesia Plan: General   Post-op Pain Management:    Induction: Intravenous  PONV Risk Score and Plan: 3 and Ondansetron, Dexamethasone and Treatment may vary due to age or medical condition  Airway Management Planned: Oral ETT  Additional Equipment:   Intra-op Plan:   Post-operative Plan: Extubation in OR  Informed Consent: I have reviewed the patients History and Physical, chart, labs and discussed the procedure including the risks, benefits and alternatives for the proposed anesthesia with the patient or authorized representative who has indicated his/her understanding and acceptance.       Plan Discussed with: CRNA, Anesthesiologist and Surgeon  Anesthesia Plan Comments:         Anesthesia Quick Evaluation

## 2019-05-25 NOTE — Interval H&P Note (Signed)
History and Physical Interval Note:  05/25/2019 10:50 AM  Hailey Cain  has presented today for surgery, with the diagnosis of Epiretinal membrane, left eye.  The various methods of treatment have been discussed with the patient and family. After consideration of risks, benefits and other options for treatment, the patient has consented to  Procedure(s): PARS PLANA VITRECTOMY WITH 25 GAUGE (Left) MEMBRANE PEEL (Left) as a surgical intervention.  The patient's history has been reviewed, patient examined, no change in status, stable for surgery.  I have reviewed the patient's chart and labs.  Questions were answered to the patient's satisfaction.     Bernarda Caffey

## 2019-05-25 NOTE — Brief Op Note (Signed)
05/25/2019  1:36 PM  PATIENT:  Hailey Cain  74 y.o. female  PRE-OPERATIVE DIAGNOSIS:  Epiretinal membrane, left eye  POST-OPERATIVE DIAGNOSIS:  Epiretinal membrane, left eye  PROCEDURE:  Procedure(s): PARS PLANA VITRECTOMY WITH 25 GAUGE (Left) MEMBRANE PEEL (Left)  SURGEON:  Surgeon(s) and Role:    Bernarda Caffey, MD - Primary  ASSISTANTS: Ernest Mallick, Ophthalmic Assistant  ANESTHESIA:   general  EBL:  minimal   BLOOD ADMINISTERED:none  DRAINS: none   LOCAL MEDICATIONS USED:  NONE  SPECIMEN:  No Specimen  DISPOSITION OF SPECIMEN:  N/A  COUNTS:  YES  TOURNIQUET:  * No tourniquets in log *  DICTATION: .Note written in EPIC  PLAN OF CARE: Discharge to home after PACU  PATIENT DISPOSITION:  PACU - hemodynamically stable.   Delay start of Pharmacological VTE agent (>24hrs) due to surgical blood loss or risk of bleeding: not applicable

## 2019-05-26 ENCOUNTER — Ambulatory Visit (INDEPENDENT_AMBULATORY_CARE_PROVIDER_SITE_OTHER): Payer: Medicare HMO | Admitting: Ophthalmology

## 2019-05-26 ENCOUNTER — Encounter (INDEPENDENT_AMBULATORY_CARE_PROVIDER_SITE_OTHER): Payer: Self-pay | Admitting: Ophthalmology

## 2019-05-26 ENCOUNTER — Telehealth: Payer: Self-pay

## 2019-05-26 DIAGNOSIS — H26493 Other secondary cataract, bilateral: Secondary | ICD-10-CM

## 2019-05-26 DIAGNOSIS — H3581 Retinal edema: Secondary | ICD-10-CM

## 2019-05-26 DIAGNOSIS — H35373 Puckering of macula, bilateral: Secondary | ICD-10-CM

## 2019-05-26 DIAGNOSIS — Z961 Presence of intraocular lens: Secondary | ICD-10-CM

## 2019-05-26 NOTE — Telephone Encounter (Signed)
Left voicemail for patient to return call and if patient returns call it is okay or PEC to advise patient.

## 2019-05-26 NOTE — Anesthesia Postprocedure Evaluation (Signed)
Anesthesia Post Note  Patient: Hailey Cain  Procedure(s) Performed: PARS PLANA VITRECTOMY WITH 25 GAUGE (Left Eye) MEMBRANE PEEL (Left Eye)     Patient location during evaluation: PACU Anesthesia Type: General Level of consciousness: awake and alert Pain management: pain level controlled Vital Signs Assessment: post-procedure vital signs reviewed and stable Respiratory status: spontaneous breathing, nonlabored ventilation, respiratory function stable and patient connected to nasal cannula oxygen Cardiovascular status: blood pressure returned to baseline and stable Postop Assessment: no apparent nausea or vomiting Anesthetic complications: no    Last Vitals:  Vitals:   05/25/19 1500 05/25/19 1510  BP: (!) 92/54 (!) 99/50  Pulse: 71 73  Resp: (!) 25   Temp:  37.2 C  SpO2: 94% 94%    Last Pain:  Vitals:   05/25/19 1415  PainSc: 5                  Nakyah Erdmann COKER

## 2019-05-26 NOTE — Telephone Encounter (Signed)
-----   Message from Trinna Post, [place] sent at 05/26/2019  8:24 AM EST ----- Urine culture did not grow any bacteria.   Best, Carles Collet, PA-C

## 2019-05-29 NOTE — Telephone Encounter (Signed)
Patient was advised.  

## 2019-05-31 NOTE — Progress Notes (Signed)
[facility] Note  06/02/2019     CHIEF COMPLAINT Patient presents for Post-op Follow-up   HISTORY OF PRESENT ILLNESS: Hailey Cain is a 74 y.o. female who presents to the clinic today for:   HPI    Post-op Follow-up    In left eye.  Discomfort includes none.  Vision is improved.  I, the attending physician,  performed the HPI with the patient and updated documentation appropriately.          Comments    Pt states her vision is slowly improving OS.  Patient denies eye pain or discomfort and denies any new or worsening floaters or fol OU.       Last edited by Bernarda Caffey, MD on 06/02/2019  9:21 AM. (History)      Referring physician: Trinna Post, PA-C [address] [place],  [place] [address]  HISTORICAL INFORMATION:   Selected notes from the MEDICAL RECORD NUMBER    CURRENT MEDICATIONS: Current Outpatient Medications (Ophthalmic Drugs)  Medication Sig  . Polyethyl Glycol-Propyl Glycol (LUBRICANT EYE DROPS) 0.4-0.3 % SOLN Place 1 drop into both eyes 2 (two) times daily.  . prednisoLONE acetate (PRED FORTE) 1 % ophthalmic suspension Place 1 drop into the left eye 4 (four) times daily.   No current facility-administered medications for this visit. (Ophthalmic Drugs)   Current Outpatient Medications (Other)  Medication Sig  . alendronate (FOSAMAX) 70 MG tablet Take 1 tablet (70 mg total) by mouth every 7 (seven) days. Take with a full glass of water on an empty stomach. (Patient taking differently: Take 70 mg by mouth every Wednesday. Take with a full glass of water on an empty stomach.)  . aspirin EC 325 MG tablet Take 325 mg by mouth daily.  . Calcium Carbonate (CALCIUM 600 PO) Take 600 mg by mouth 2 (two) times a week.   . Cholecalciferol (VITAMIN D-3) 25 MCG (1000 UT) CAPS Take 1,000 Units by mouth daily.  Marland Kitchen FLUoxetine (PROZAC) 20 MG capsule TAKE 2 CAPSULES (40 MG TOTAL) BY MOUTH ONCE DAILY (Patient taking differently:  Take 20 mg by mouth 2 (two) times daily. TAKE 2 CAPSULES (40 MG TOTAL) BY MOUTH ONCE DAILY)  . levothyroxine (SYNTHROID) 88 MCG tablet TAKE 1 TABLET BY MOUTH ONCE DAILY BEFORE BREAKFAST (Patient taking differently: Take 88 mcg by mouth daily before breakfast. )  . loratadine (CLARITIN) 10 MG tablet Take 1 tablet (10 mg total) by mouth daily. Reported on 05/17/2015 (Patient taking differently: Take 10 mg by mouth daily as needed for allergies. Reported on 05/17/2015)  . Multiple Vitamins-Minerals (EMERGEN-C IMMUNE PO) Take 1 tablet by mouth daily as needed (immunity boost).   Marland Kitchen omeprazole (PRILOSEC) 20 MG capsule Take 1 capsule (20 mg total) by mouth daily.  . simvastatin (ZOCOR) 40 MG tablet Take 1 tablet (40 mg total) by mouth daily. (Patient taking differently: Take 40 mg by mouth every evening. )  . vitamin B-12 (CYANOCOBALAMIN) 500 MCG tablet Take 500 mcg by mouth daily.   No current facility-administered medications for this visit. (Other)      REVIEW OF SYSTEMS: ROS    Positive for: Gastrointestinal, Eyes   Negative for: Constitutional, Neurological, Skin, Genitourinary, Musculoskeletal, HENT, Endocrine, Cardiovascular, Respiratory, Psychiatric, Allergic/Imm, Heme/Lymph   Last edited by Doneen Poisson on 06/02/2019  9:00 AM. (History)       ALLERGIES No Known Allergies  PAST MEDICAL HISTORY Past Medical History:  Diagnosis Date  . Acid reflux  12/30/2006  . Adult hypothyroidism 01/13/2007  . Allergic rhinitis 09/07/2014  . Anxiety 12/10/2014  . Bronchitis 05/17/2015  . Cancer of cheek ([place]) 07/04/2007  . Depression, neurotic 12/30/2006  . Hyperlipidemia   . Prediabetes 08/11/2016  . RAD (reactive airway disease) 09/07/2014  . Small bowel obstruction ([place]) 01/31/2017   Past Surgical History:  Procedure Laterality Date  . ABDOMINAL HYSTERECTOMY  1988   heavy, irregular menses; ovaries intact  . AUGMENTATION MAMMAPLASTY Bilateral    silicone  . BREAST SURGERY  1970   augmentation  .  CATARACT EXTRACTION Bilateral 2013   Dr. Talbert Forest.  [place].  OD-Distance.  Marland Kitchen EYE SURGERY     cataract extraction  . EYE SURGERY  2007   LASIK  . MEMBRANE PEEL Left 05/25/2019   Procedure: MEMBRANE PEEL;  Surgeon: Bernarda Caffey, MD;  Location: [facility];  Service: Ophthalmology;  Laterality: Left;  . PARS PLANA VITRECTOMY Left 05/25/2019   Procedure: PARS PLANA VITRECTOMY WITH 25 GAUGE;  Surgeon: Bernarda Caffey, MD;  Location: [facility];  Service: Ophthalmology;  Laterality: Left;  . TONSILLECTOMY AND ADENOIDECTOMY  1950  . TUBAL LIGATION    . YAG LASER APPLICATION Left [id number]   Dr. Bernarda Caffey    FAMILY HISTORY Family History  Problem Relation Age of Onset  . Stroke Mother   . CAD Mother   . Heart attack Father   . Hyperthyroidism Sister   . Heart Problems Brother   . Cancer Brother        skin  . CAD Brother        CABG    SOCIAL HISTORY Social History   Tobacco Use  . Smoking status: Former Smoker    Packs/day: 1.00    Years: 20.00    Pack years: 20.00    Quit date: 05/12/2003    Years since quitting: 16.0  . Smokeless tobacco: Never Used  Substance Use Topics  . Alcohol use: No    Alcohol/week: 0.0 standard drinks  . Drug use: No         OPHTHALMIC EXAM:  Base Eye Exam    Visual Acuity (Snellen - Linear)      Right Left   Dist [place] 20/25 -2 20/150   Dist ph [place] NI NI       Tonometry (Tonopen, 9:04 AM)      Right Left   Pressure 13 07       Pupils      Dark Light Shape React APD   Right 4 3 Round Brisk 0   Left 8 8 Round Dilated 0       Visual Fields      Left Right     Full   Restrictions Partial outer superior temporal, inferior temporal, superior nasal, inferior nasal deficiencies        Extraocular Movement      Right Left    Full Full       Neuro/Psych    Oriented x3: Yes   Mood/Affect: Normal       Dilation    Left eye: 1.0% Mydriacyl, 2.5% Phenylephrine @ 9:04 AM        Slit Lamp and Fundus Exam    Slit Lamp Exam      Right Left    Lids/Lashes Dermatochalasis - upper lid, mild Meibomian gland dysfunction Dermatochalasis - upper lid   Conjunctiva/Sclera Trace nasal Pinguecula sutures intact, white and quiet   Cornea Arcus 1+ PEE   Anterior Chamber Deep and quiet  deep and clear   Iris Round and dilated round and dilated   Lens PC IOL in good position, trace, non-central Posterior capsular opacification PC IOL   Vitreous Vitreous syneresis post-vitrectomy, gas bubble 50-55%       Fundus Exam      Right Left   Disc  trace pallor, sharp rim   C/D Ratio 0.3 0.3   Macula  flat, ERM and PRF gone   Vessels  attenuation, tortuous   Periphery  attached, good laser changes 360          IMAGING AND PROCEDURES  Imaging and Procedures for @TODAY @  OCT, Retina - OU - Both Eyes       Right Eye Quality was good. Central Foveal Thickness: 271. Progression has been stable. Findings include abnormal foveal contour, no IRF, retinal drusen , no SRF, epiretinal membrane (Mild ERM greatest nasally).   Left Eye Quality was good. Central Foveal Thickness: 400. Progression has improved. Findings include abnormal foveal contour, no IRF, no SRF, retinal drusen  (ERM and PRF gone).   Notes *Images captured and stored on drive  Diagnosis / Impression:  Non-exu ARMD OU,  Mild nasal ERM OD ERM, pucker and PRF gone OS  Clinical management:  See below  Abbreviations: NFP - Normal foveal profile. CME - cystoid macular edema. PED - pigment epithelial detachment. IRF - intraretinal fluid. SRF - subretinal fluid. EZ - ellipsoid zone. ERM - epiretinal membrane. ORA - outer retinal atrophy. ORT - outer retinal tubulation. SRHM - subretinal hyper-reflective material                 ASSESSMENT/PLAN:    ICD-10-CM   1. Epiretinal membrane (ERM) of both eyes  H35.373 prednisoLONE acetate (PRED FORTE) 1 % ophthalmic suspension  2. Retinal edema  H35.81 OCT, Retina - OU - Both Eyes  3. Pseudophakia of both eyes  Z96.1   4.  Bilateral posterior capsular opacification  H26.493     1,2. Epiretinal membrane, OU  - OD: mild ERM, greatest nasally  - OS: with pucker and PRF  - OD asymptomatic, no metamorphopsia  - no indication for surgery at this time -- monitor  - OS +metamorophopsia with reading  - now POW1 OS s/p 25g PPV/TissueBlue stain/MP/20% SF6, 01.14.21             - doing well this morning  - gas bubble already down to 50-55%             - IOP okay at 7 mmHg             - cont   PF 4x/day OS                          zymaxid QID OS stop on 01.24.21                         Atropine stop   Brimonidine stop                         PSO ung qhs and as needed OS             - cont face down positioning x using face down positioning 50% of time; avoid laying flat on back              - eye shield when sleeping              -  post op drop and positioning instructions reviewed              - tylenol/ibuprofen for pain  - f/u 2 weeks  3. Pseudophakia OU  - s/p CE/IOL OU (Dr Talbert Forest, 2013) -- monovision with OS being near eye  - beautiful surgeries, doing well  - monitor  4. PCO OU  - OD: trace, non-central  - OS s/p YAG cap 11.13.20 - nice opening in place  - stable, monitor   Ophthalmic Meds Ordered this visit:  Meds ordered this encounter  Medications  . prednisoLONE acetate (PRED FORTE) 1 % ophthalmic suspension    Sig: Place 1 drop into the left eye 4 (four) times daily.    Dispense:  15 mL    Refill:  0       Return in about 2 weeks (around 06/16/2019) for DFE, OCT OS.  There are no Patient Instructions on file for this visit.   Explained the diagnoses, plan, and follow up with the patient and they expressed understanding.  Patient expressed understanding of the importance of proper follow up care.   This document serves as a record of services personally performed by Gardiner Sleeper, MD, PhD. It was created on their behalf by Roselee Nova, COMT. The creation of this record is the  provider's dictation and/or activities during the visit.  Electronically signed by: Roselee Nova, COMT 06/04/19 10:03 PM    Gardiner Sleeper, M.D., Ph.D. Diseases & Surgery of the Retina and Vitreous [facility]  I have reviewed the above documentation for accuracy and completeness, and I agree with the above. Gardiner Sleeper, M.D., Ph.D. 06/04/19 10:05 PM    Abbreviations: M myopia (nearsighted); A astigmatism; H hyperopia (farsighted); P presbyopia; Mrx spectacle prescription;  CTL contact lenses; OD right eye; OS left eye; OU both eyes  XT exotropia; ET esotropia; PEK punctate epithelial keratitis; PEE punctate epithelial erosions; DES dry eye syndrome; MGD meibomian gland dysfunction; ATs artificial tears; PFAT's preservative free artificial tears; [place] nuclear sclerotic cataract; PSC posterior subcapsular cataract; ERM epi-retinal membrane; PVD posterior vitreous detachment; RD retinal detachment; DM diabetes mellitus; DR diabetic retinopathy; NPDR non-proliferative diabetic retinopathy; PDR proliferative diabetic retinopathy; CSME clinically significant macular edema; DME diabetic macular edema; dbh dot blot hemorrhages; CWS cotton wool spot; POAG primary open angle glaucoma; C/D cup-to-disc ratio; HVF humphrey visual field; GVF goldmann visual field; OCT optical coherence tomography; IOP intraocular pressure; BRVO Branch retinal vein occlusion; CRVO central retinal vein occlusion; CRAO central retinal artery occlusion; BRAO branch retinal artery occlusion; RT retinal tear; SB scleral buckle; PPV pars plana vitrectomy; VH Vitreous hemorrhage; PRP panretinal laser photocoagulation; IVK intravitreal kenalog; VMT vitreomacular traction; MH Macular hole;  NVD neovascularization of the disc; NVE neovascularization elsewhere; AREDS age related eye disease study; ARMD age related macular degeneration; POAG primary open angle glaucoma; EBMD epithelial/anterior basement membrane  dystrophy; ACIOL anterior chamber intraocular lens; IOL intraocular lens; PCIOL posterior chamber intraocular lens; Phaco/IOL phacoemulsification with intraocular lens placement; [place] photorefractive keratectomy; LASIK laser assisted in situ keratomileusis; HTN hypertension; DM diabetes mellitus; COPD chronic obstructive pulmonary disease

## 2019-06-02 ENCOUNTER — Ambulatory Visit (INDEPENDENT_AMBULATORY_CARE_PROVIDER_SITE_OTHER): Payer: Medicare HMO | Admitting: Ophthalmology

## 2019-06-02 ENCOUNTER — Encounter (INDEPENDENT_AMBULATORY_CARE_PROVIDER_SITE_OTHER): Payer: Self-pay | Admitting: Ophthalmology

## 2019-06-02 ENCOUNTER — Other Ambulatory Visit: Payer: Self-pay

## 2019-06-02 DIAGNOSIS — H3581 Retinal edema: Secondary | ICD-10-CM

## 2019-06-02 DIAGNOSIS — Z961 Presence of intraocular lens: Secondary | ICD-10-CM

## 2019-06-02 DIAGNOSIS — H26493 Other secondary cataract, bilateral: Secondary | ICD-10-CM

## 2019-06-02 DIAGNOSIS — H35373 Puckering of macula, bilateral: Secondary | ICD-10-CM

## 2019-06-02 MED ORDER — PREDNISOLONE ACETATE 1 % OP SUSP: 1.0000 [drp] | Freq: Four times a day (QID) | OPHTHALMIC | 0 refills | Status: DC

## 2019-06-14 NOTE — Progress Notes (Addendum)
[facility] Note  06/16/2019     CHIEF COMPLAINT Patient presents for Post-op Follow-up   HISTORY OF PRESENT ILLNESS: Hailey Cain is a 74 y.o. female who presents to the clinic today for:   HPI    Post-op Follow-up    In left eye.  Discomfort includes itching and foreign body sensation.  Negative for pain, tearing, discharge, floaters and none.  Vision is improved.  I, the attending physician,  performed the HPI with the patient and updated documentation appropriately.          Comments    Patient states vision improving OS. Occasional scratchy sensation OS. Using PF qid OS and polysporin ung qhs and prn throughout the day.        Last edited by Bernarda Caffey, MD on 06/16/2019  2:58 PM. (History)    pt states the gas bubble disappeard at the beginning of the week, she states she is using PF QID and PSO Ung as needed or before bed  Referring physician: Trinna Post, PA-C [address],  [place] [address]  HISTORICAL INFORMATION:   Selected notes from the MEDICAL RECORD NUMBER    CURRENT MEDICATIONS: Current Outpatient Medications (Ophthalmic Drugs)  Medication Sig  . bacitracin-polymyxin b (POLYSPORIN) ophthalmic ointment 1 application at bedtime as needed. At bedtime and as needed throughout the day  . Polyethyl Glycol-Propyl Glycol (LUBRICANT EYE DROPS) 0.4-0.3 % SOLN Place 1 drop into both eyes 2 (two) times daily.  . prednisoLONE acetate (PRED FORTE) 1 % ophthalmic suspension Place 1 drop into the left eye 4 (four) times daily.   No current facility-administered medications for this visit. (Ophthalmic Drugs)   Current Outpatient Medications (Other)  Medication Sig  . alendronate (FOSAMAX) 70 MG tablet Take 1 tablet (70 mg total) by mouth every 7 (seven) days. Take with a full glass of water on an empty stomach. (Patient taking differently: Take 70 mg by mouth every Wednesday. Take with a full glass of water on an  empty stomach.)  . aspirin EC 325 MG tablet Take 325 mg by mouth daily.  . Calcium Carbonate (CALCIUM 600 PO) Take 600 mg by mouth 2 (two) times a week.   . Cholecalciferol (VITAMIN D-3) 25 MCG (1000 UT) CAPS Take 1,000 Units by mouth daily.  Marland Kitchen FLUoxetine (PROZAC) 20 MG capsule TAKE 2 CAPSULES (40 MG TOTAL) BY MOUTH ONCE DAILY (Patient taking differently: Take 20 mg by mouth 2 (two) times daily. TAKE 2 CAPSULES (40 MG TOTAL) BY MOUTH ONCE DAILY)  . levothyroxine (SYNTHROID) 88 MCG tablet TAKE 1 TABLET BY MOUTH ONCE DAILY BEFORE BREAKFAST (Patient taking differently: Take 88 mcg by mouth daily before breakfast. )  . loratadine (CLARITIN) 10 MG tablet Take 1 tablet (10 mg total) by mouth daily. Reported on 05/17/2015 (Patient taking differently: Take 10 mg by mouth daily as needed for allergies. Reported on 05/17/2015)  . Multiple Vitamins-Minerals (EMERGEN-C IMMUNE PO) Take 1 tablet by mouth daily as needed (immunity boost).   Marland Kitchen omeprazole (PRILOSEC) 20 MG capsule Take 1 capsule (20 mg total) by mouth daily.  . simvastatin (ZOCOR) 40 MG tablet Take 1 tablet (40 mg total) by mouth daily. (Patient taking differently: Take 40 mg by mouth every evening. )  . vitamin B-12 (CYANOCOBALAMIN) 500 MCG tablet Take 500 mcg by mouth daily.   No current facility-administered medications for this visit. (Other)      REVIEW OF SYSTEMS: ROS  Positive for: Gastrointestinal, Eyes   Negative for: Constitutional, Neurological, Skin, Genitourinary, Musculoskeletal, HENT, Endocrine, Cardiovascular, Respiratory, Psychiatric, Allergic/Imm, Heme/Lymph   Last edited by Roselee Nova D, COT on 06/16/2019  2:13 PM. (History)       ALLERGIES No Known Allergies  PAST MEDICAL HISTORY Past Medical History:  Diagnosis Date  . Acid reflux 12/30/2006  . Adult hypothyroidism 01/13/2007  . Allergic rhinitis 09/07/2014  . Anxiety 12/10/2014  . Bronchitis 05/17/2015  . Cancer of cheek ([place]) 07/04/2007  . Depression, neurotic  12/30/2006  . Hyperlipidemia   . Prediabetes 08/11/2016  . RAD (reactive airway disease) 09/07/2014  . Small bowel obstruction ([place]) 01/31/2017   Past Surgical History:  Procedure Laterality Date  . ABDOMINAL HYSTERECTOMY  1988   heavy, irregular menses; ovaries intact  . AUGMENTATION MAMMAPLASTY Bilateral    silicone  . BREAST SURGERY  1970   augmentation  . CATARACT EXTRACTION Bilateral 2013   Dr. Talbert Forest.  [place].  OD-Distance.  Marland Kitchen EYE SURGERY     cataract extraction  . EYE SURGERY  2007   LASIK  . MEMBRANE PEEL Left 05/25/2019   Procedure: MEMBRANE PEEL;  Surgeon: Bernarda Caffey, MD;  Location: [facility];  Service: Ophthalmology;  Laterality: Left;  . PARS PLANA VITRECTOMY Left 05/25/2019   Procedure: PARS PLANA VITRECTOMY WITH 25 GAUGE;  Surgeon: Bernarda Caffey, MD;  Location: [facility];  Service: Ophthalmology;  Laterality: Left;  . TONSILLECTOMY AND ADENOIDECTOMY  1950  . TUBAL LIGATION    . YAG LASER APPLICATION Left [id number]   Dr. Bernarda Caffey    FAMILY HISTORY Family History  Problem Relation Age of Onset  . Stroke Mother   . CAD Mother   . Heart attack Father   . Hyperthyroidism Sister   . Heart Problems Brother   . Cancer Brother        skin  . CAD Brother        CABG    SOCIAL HISTORY Social History   Tobacco Use  . Smoking status: Former Smoker    Packs/day: 1.00    Years: 20.00    Pack years: 20.00    Quit date: 05/12/2003    Years since quitting: 16.1  . Smokeless tobacco: Never Used  Substance Use Topics  . Alcohol use: No    Alcohol/week: 0.0 standard drinks  . Drug use: No         OPHTHALMIC EXAM:  Base Eye Exam    Visual Acuity (Snellen - Linear)      Right Left   Dist [place] 20/20 -1 20/200 +1   Dist ph [place]  20/70 +2       Tonometry (Tonopen, 2:24 PM)      Right Left   Pressure 18 13       Pupils      Dark Light Shape React APD   Right 5 3 Round Brisk None   Left 6 6 Round Minimal None       Visual Fields (Counting fingers)      Left  Right    Full Full       Extraocular Movement      Right Left    Full, Ortho Full, Ortho       Neuro/Psych    Oriented x3: Yes   Mood/Affect: Normal       Dilation    Both eyes: 1.0% Mydriacyl, 2.5% Phenylephrine @ 2:25 PM        Slit Lamp and Fundus Exam  Slit Lamp Exam      Right Left   Lids/Lashes Dermatochalasis - upper lid, mild Meibomian gland dysfunction Dermatochalasis - upper lid   Conjunctiva/Sclera Trace nasal Pinguecula sutures dissolving, white and quiet   Cornea Arcus 2-3+ PEE, decreased TBUT   Anterior Chamber Deep and quiet deep and clear   Iris Round and dilated round and dilated   Lens PC IOL in good position, trace, non-central Posterior capsular opacification PC IOL   Vitreous Vitreous syneresis post-vitrectomy, gas bubble gone       Fundus Exam      Right Left   Disc Pink and Sharp Mild pallor, sharp rim, temporal PPA   C/D Ratio 0.3 0.3   Macula Flat, Good foveal reflex, parafoveal Drusen flat, ERM and PRF gone, Blunted foveal reflex, Retinal pigment epithelial mottling, central thickening improving   Vessels Vascular attenuation, Tortuous attenuation, tortuous   Periphery Attached, No heme  attached, good peripheral laser changes 360        Refraction    Manifest Refraction      Sphere Cylinder Axis Dist VA   Right       Left -3.25 +1.25 172 20/70-2+2          IMAGING AND PROCEDURES  Imaging and Procedures for @TODAY @  OCT, Retina - OU - Both Eyes       Right Eye Quality was good. Central Foveal Thickness: 271. Progression has been stable. Findings include abnormal foveal contour, no IRF, retinal drusen , no SRF, epiretinal membrane (Mild ERM greatest nasally).   Left Eye Quality was good. Central Foveal Thickness: 382. Progression has improved. Findings include abnormal foveal contour, no IRF, no SRF, retinal drusen  (ERM and PRF gone; interval improvement in central retinal thickening, patchy ORA).   Notes *Images captured  and stored on drive  Diagnosis / Impression:  Non-exu ARMD OU OD: Mild nasal ERM OD OS: ERM and PRF gone; interval improvement in central retinal thickening, patchy ORA  Clinical management:  See below  Abbreviations: NFP - Normal foveal profile. CME - cystoid macular edema. PED - pigment epithelial detachment. IRF - intraretinal fluid. SRF - subretinal fluid. EZ - ellipsoid zone. ERM - epiretinal membrane. ORA - outer retinal atrophy. ORT - outer retinal tubulation. SRHM - subretinal hyper-reflective material                 ASSESSMENT/PLAN:    ICD-10-CM   1. Epiretinal membrane (ERM) of both eyes  H35.373   2. Retinal edema  H35.81 OCT, Retina - OU - Both Eyes  3. Pseudophakia of both eyes  Z96.1   4. Bilateral posterior capsular opacification  H26.493     1,2. Epiretinal membrane, OU  - OD: mild ERM, greatest nasally  - OS: with pucker and PRF  - OD asymptomatic, no metamorphopsia  - no indication for surgery at this time -- monitor  - OS +metamorophopsia with reading  - now POW3 OS s/p 25g PPV/TissueBlue stain/MP/20% SF6, 01.14.21             - doing well   - gas bubble gone today             - IOP okay at 13 mmHg             - cont   PF 4x/day OS -- decrease to TID                         [facility]  ung qhs and as needed OS -- okay to stop  - add AT's QID and ointment at bedtime             - post op drops reviewed              - tylenol/ibuprofen for pain  - f/u 4 weeks  3. Pseudophakia OU  - s/p CE/IOL OU (Dr Talbert Forest, 2013) -- monovision with OS being near eye  - beautiful surgeries, doing well  - monitor  4. PCO OU  - OD: trace, non-central  - OS s/p YAG cap 11.13.20 - nice opening in place  - stable, monitor   Ophthalmic Meds Ordered this visit:  No orders of the defined types were placed in this encounter.      Return in about 4 weeks (around 07/14/2019) for f/u ERM OS, DFE, OCT.  There are no Patient Instructions on file for this visit.   Explained  the diagnoses, plan, and follow up with the patient and they expressed understanding.  Patient expressed understanding of the importance of proper follow up care.   This document serves as a record of services personally performed by Gardiner Sleeper, MD, PhD. It was created on their behalf by Ernest Mallick, OA, an ophthalmic assistant. The creation of this record is the provider's dictation and/or activities during the visit.    Electronically signed by: Ernest Mallick, OA 02.03.2021 4:30 PM  Gardiner Sleeper, M.D., Ph.D. Diseases & Surgery of the Retina and Vitreous [facility]  I have reviewed the above documentation for accuracy and completeness, and I agree with the above. Gardiner Sleeper, M.D., Ph.D. 06/16/19 4:30 PM   Abbreviations: M myopia (nearsighted); A astigmatism; H hyperopia (farsighted); P presbyopia; Mrx spectacle prescription;  CTL contact lenses; OD right eye; OS left eye; OU both eyes  XT exotropia; ET esotropia; PEK punctate epithelial keratitis; PEE punctate epithelial erosions; DES dry eye syndrome; MGD meibomian gland dysfunction; ATs artificial tears; PFAT's preservative free artificial tears; [place] nuclear sclerotic cataract; PSC posterior subcapsular cataract; ERM epi-retinal membrane; PVD posterior vitreous detachment; RD retinal detachment; DM diabetes mellitus; DR diabetic retinopathy; NPDR non-proliferative diabetic retinopathy; PDR proliferative diabetic retinopathy; CSME clinically significant macular edema; DME diabetic macular edema; dbh dot blot hemorrhages; CWS cotton wool spot; POAG primary open angle glaucoma; C/D cup-to-disc ratio; HVF humphrey visual field; GVF goldmann visual field; OCT optical coherence tomography; IOP intraocular pressure; BRVO Branch retinal vein occlusion; CRVO central retinal vein occlusion; CRAO central retinal artery occlusion; BRAO branch retinal artery occlusion; RT retinal tear; SB scleral buckle; PPV pars plana  vitrectomy; VH Vitreous hemorrhage; PRP panretinal laser photocoagulation; IVK intravitreal kenalog; VMT vitreomacular traction; MH Macular hole;  NVD neovascularization of the disc; NVE neovascularization elsewhere; AREDS age related eye disease study; ARMD age related macular degeneration; POAG primary open angle glaucoma; EBMD epithelial/anterior basement membrane dystrophy; ACIOL anterior chamber intraocular lens; IOL intraocular lens; PCIOL posterior chamber intraocular lens; Phaco/IOL phacoemulsification with intraocular lens placement; [place] photorefractive keratectomy; LASIK laser assisted in situ keratomileusis; HTN hypertension; DM diabetes mellitus; COPD chronic obstructive pulmonary disease

## 2019-06-16 ENCOUNTER — Ambulatory Visit (INDEPENDENT_AMBULATORY_CARE_PROVIDER_SITE_OTHER): Payer: Medicare HMO | Admitting: Ophthalmology

## 2019-06-16 ENCOUNTER — Encounter (INDEPENDENT_AMBULATORY_CARE_PROVIDER_SITE_OTHER): Payer: Self-pay | Admitting: Ophthalmology

## 2019-06-16 DIAGNOSIS — H3581 Retinal edema: Secondary | ICD-10-CM | POA: Diagnosis not present

## 2019-06-16 DIAGNOSIS — H35373 Puckering of macula, bilateral: Secondary | ICD-10-CM

## 2019-06-16 DIAGNOSIS — H26493 Other secondary cataract, bilateral: Secondary | ICD-10-CM

## 2019-06-16 DIAGNOSIS — Z961 Presence of intraocular lens: Secondary | ICD-10-CM

## 2019-06-26 ENCOUNTER — Telehealth: Payer: Self-pay | Admitting: Physician Assistant

## 2019-06-26 DIAGNOSIS — E7800 Pure hypercholesterolemia, unspecified: Secondary | ICD-10-CM

## 2019-06-26 NOTE — Telephone Encounter (Signed)
Medication Refill - Medication:  simvastatin (ZOCOR) 40 MG tablet   Has the patient contacted their pharmacy? Yes.   (Agent: If no, request that the patient contact the pharmacy for the refill.) (Agent: If yes, when and what did the pharmacy advise?)  Preferred Pharmacy (with phone number or street name):   [facility] [address] [facility]) [place] [address]  Phone: [phone] Fax: [phone]     Agent: Please be advised that RX refills may take up to 3 business days. We ask that you follow-up with your pharmacy.

## 2019-06-27 MED ORDER — SIMVASTATIN 40 MG PO TABS: 40.0000 mg | ORAL_TABLET | Freq: Every day | ORAL | 0 refills | Status: DC

## 2019-06-27 MED ORDER — SIMVASTATIN 40 MG PO TABS: 40.0000 mg | ORAL_TABLET | Freq: Every day | ORAL | 1 refills | Status: DC

## 2019-06-27 NOTE — Telephone Encounter (Signed)
Patient has not been seen for an appointment for her cholesterol since 09/2017, sent in one month supply.

## 2019-07-14 ENCOUNTER — Encounter (INDEPENDENT_AMBULATORY_CARE_PROVIDER_SITE_OTHER): Payer: Medicare HMO | Admitting: Ophthalmology

## 2019-07-26 NOTE — Progress Notes (Signed)
[facility] Note  07/28/2019     CHIEF COMPLAINT Patient presents for Post-op Follow-up   HISTORY OF PRESENT ILLNESS: Hailey Cain is a 74 y.o. female who presents to the clinic today for:   HPI    Post-op Follow-up    In left eye.  Discomfort includes Negative for pain, itching, foreign body sensation, tearing, discharge, floaters and none.  Vision is improved.  I, the attending physician,  performed the HPI with the patient and updated documentation appropriately.          Comments    Patient states vision improving OS, especially for reading. No eye pain. Using Pred Forte bid OS.        Last edited by Bernarda Caffey, MD on 07/28/2019 12:14 PM. (History)    pt states she feels like her vision is getting better   Referring physician: Trinna Post, PA-C [address] [place],  [place] [id number]  HISTORICAL INFORMATION:   Selected notes from the MEDICAL RECORD NUMBER    CURRENT MEDICATIONS: Current Outpatient Medications (Ophthalmic Drugs)  Medication Sig  . bacitracin-polymyxin b (POLYSPORIN) ophthalmic ointment 1 application at bedtime as needed. At bedtime and as needed throughout the day  . Polyethyl Glycol-Propyl Glycol (LUBRICANT EYE DROPS) 0.4-0.3 % SOLN Place 1 drop into both eyes 2 (two) times daily.  . prednisoLONE acetate (PRED FORTE) 1 % ophthalmic suspension Place 1 drop into the left eye 4 (four) times daily.   No current facility-administered medications for this visit. (Ophthalmic Drugs)   Current Outpatient Medications (Other)  Medication Sig  . alendronate (FOSAMAX) 70 MG tablet Take 1 tablet (70 mg total) by mouth every 7 (seven) days. Take with a full glass of water on an empty stomach. (Patient taking differently: Take 70 mg by mouth every Wednesday. Take with a full glass of water on an empty stomach.)  . aspirin EC 325 MG tablet Take 325 mg by mouth daily.  . Calcium Carbonate (CALCIUM 600 PO) Take  600 mg by mouth 2 (two) times a week.   . Cholecalciferol (VITAMIN D-3) 25 MCG (1000 UT) CAPS Take 1,000 Units by mouth daily.  Marland Kitchen FLUoxetine (PROZAC) 20 MG capsule TAKE 2 CAPSULES (40 MG TOTAL) BY MOUTH ONCE DAILY (Patient taking differently: Take 20 mg by mouth 2 (two) times daily. TAKE 2 CAPSULES (40 MG TOTAL) BY MOUTH ONCE DAILY)  . levothyroxine (SYNTHROID) 88 MCG tablet TAKE 1 TABLET BY MOUTH ONCE DAILY BEFORE BREAKFAST (Patient taking differently: Take 88 mcg by mouth daily before breakfast. )  . loratadine (CLARITIN) 10 MG tablet Take 1 tablet (10 mg total) by mouth daily. Reported on 05/17/2015 (Patient taking differently: Take 10 mg by mouth daily as needed for allergies. Reported on 05/17/2015)  . Multiple Vitamins-Minerals (EMERGEN-C IMMUNE PO) Take 1 tablet by mouth daily as needed (immunity boost).   Marland Kitchen omeprazole (PRILOSEC) 20 MG capsule Take 1 capsule (20 mg total) by mouth daily.  . simvastatin (ZOCOR) 40 MG tablet Take 1 tablet (40 mg total) by mouth daily.  . vitamin B-12 (CYANOCOBALAMIN) 500 MCG tablet Take 500 mcg by mouth daily.   No current facility-administered medications for this visit. (Other)      REVIEW OF SYSTEMS: ROS    Positive for: Gastrointestinal, Eyes   Negative for: Constitutional, Neurological, Skin, Genitourinary, Musculoskeletal, HENT, Endocrine, Cardiovascular, Respiratory, Psychiatric, Allergic/Imm, Heme/Lymph   Last edited by Roselee Nova D, COT on 07/28/2019  9:53 AM. (History)  ALLERGIES No Known Allergies  PAST MEDICAL HISTORY Past Medical History:  Diagnosis Date  . Acid reflux 12/30/2006  . Adult hypothyroidism 01/13/2007  . Allergic rhinitis 09/07/2014  . Anxiety 12/10/2014  . Bronchitis 05/17/2015  . Cancer of cheek ([place]) 07/04/2007  . Depression, neurotic 12/30/2006  . Hyperlipidemia   . Prediabetes 08/11/2016  . RAD (reactive airway disease) 09/07/2014  . Small bowel obstruction ([place]) 01/31/2017   Past Surgical History:  Procedure  Laterality Date  . ABDOMINAL HYSTERECTOMY  1988   heavy, irregular menses; ovaries intact  . AUGMENTATION MAMMAPLASTY Bilateral    silicone  . BREAST SURGERY  1970   augmentation  . CATARACT EXTRACTION Bilateral 2013   Dr. Talbert Forest.  [place].  OD-Distance.  Marland Kitchen EYE SURGERY     cataract extraction  . EYE SURGERY  2007   LASIK  . MEMBRANE PEEL Left 05/25/2019   Procedure: MEMBRANE PEEL;  Surgeon: Bernarda Caffey, MD;  Location: [facility];  Service: Ophthalmology;  Laterality: Left;  . PARS PLANA VITRECTOMY Left 05/25/2019   Procedure: PARS PLANA VITRECTOMY WITH 25 GAUGE;  Surgeon: Bernarda Caffey, MD;  Location: [facility];  Service: Ophthalmology;  Laterality: Left;  . TONSILLECTOMY AND ADENOIDECTOMY  1950  . TUBAL LIGATION    . YAG LASER APPLICATION Left [id number]   Dr. Bernarda Caffey    FAMILY HISTORY Family History  Problem Relation Age of Onset  . Stroke Mother   . CAD Mother   . Heart attack Father   . Hyperthyroidism Sister   . Heart Problems Brother   . Cancer Brother        skin  . CAD Brother        CABG    SOCIAL HISTORY Social History   Tobacco Use  . Smoking status: Former Smoker    Packs/day: 1.00    Years: 20.00    Pack years: 20.00    Quit date: 05/12/2003    Years since quitting: 16.2  . Smokeless tobacco: Never Used  Substance Use Topics  . Alcohol use: No    Alcohol/week: 0.0 standard drinks  . Drug use: No         OPHTHALMIC EXAM:  Base Eye Exam    Visual Acuity (Snellen - Linear)      Right Left   Dist [place] 20/25 +2 20/50 -2   Dist ph [place] 20/20 -2 NI       Tonometry (Tonopen, 10:03 AM)      Right Left   Pressure 15 15       Pupils      Dark Light Shape React APD   Right 5 4 Round Brisk None   Left 6 6 Round None None       Visual Fields (Counting fingers)      Left Right    Full Full       Extraocular Movement      Right Left    Full, Ortho Full, Ortho       Neuro/Psych    Oriented x3: Yes   Mood/Affect: Normal       Dilation    Both  eyes: 1.0% Mydriacyl, 2.5% Phenylephrine @ 10:03 AM        Slit Lamp and Fundus Exam    Slit Lamp Exam      Right Left   Lids/Lashes Dermatochalasis - upper lid, mild Meibomian gland dysfunction Dermatochalasis - upper lid   Conjunctiva/Sclera Trace nasal Pinguecula sutures dissolving, white and quiet   Cornea Arcus  Trace PEE, mild Debris in tear film   Anterior Chamber Deep and quiet deep and clear   Iris Round and dilated round and dilated   Lens PC IOL in good position, trace, non-central Posterior capsular opacification PC IOL in good position with open PC   Vitreous Vitreous syneresis post-vitrectomy       Fundus Exam      Right Left   Disc Pink and Sharp Mild pallor, sharp rim, temporal PPA   C/D Ratio 0.2 0.4   Macula Flat, Good foveal reflex, parafoveal Drusen, mild Retinal pigment epithelial mottling flat, ERM and PRF gone, Blunted foveal reflex, Retinal pigment epithelial mottling, central thickening improving   Vessels Vascular attenuation, Tortuous attenuation, tortuous   Periphery Attached, No heme  attached, good peripheral laser changes 360        Refraction    Manifest Refraction      Sphere Cylinder Axis Dist VA   Right +0.25 +1.00 005 20/20-1   Left -3.25 +1.25 005 20/50+1          IMAGING AND PROCEDURES  Imaging and Procedures for @TODAY @  OCT, Retina - OU - Both Eyes       Right Eye Quality was good. Central Foveal Thickness: 276. Progression has been stable. Findings include abnormal foveal contour, no IRF, retinal drusen , no SRF, epiretinal membrane (Mild ERM greatest nasally).   Left Eye Quality was good. Central Foveal Thickness: 374. Progression has improved. Findings include abnormal foveal contour, no IRF, no SRF, retinal drusen  (ERM and PRF gone; interval improvement in central retinal thickening, patchy ORA).   Notes *Images captured and stored on drive  Diagnosis / Impression:  Non-exu ARMD OU OD: Mild nasal ERM OD OS: ERM and PRF  gone; interval improvement in central retinal thickening, patchy ORA  Clinical management:  See below  Abbreviations: NFP - Normal foveal profile. CME - cystoid macular edema. PED - pigment epithelial detachment. IRF - intraretinal fluid. SRF - subretinal fluid. EZ - ellipsoid zone. ERM - epiretinal membrane. ORA - outer retinal atrophy. ORT - outer retinal tubulation. SRHM - subretinal hyper-reflective material                 ASSESSMENT/PLAN:    ICD-10-CM   1. Epiretinal membrane (ERM) of both eyes  H35.373   2. Retinal edema  H35.81 OCT, Retina - OU - Both Eyes  3. Pseudophakia of both eyes  Z96.1   4. Bilateral posterior capsular opacification  H26.493     1,2. Epiretinal membrane, OU  - OD: mild ERM, greatest nasally  - OS: with pucker and PRF  - OD asymptomatic, no metamorphopsia  - no indication for surgery at this time -- monitor  - OS +metamorophopsia with reading  - now POM2 OS s/p 25g PPV/TissueBlue stain/MP/20% SF6, 01.14.21             - doing well              - IOP okay at 15 mmHg             - taper PF -- decrease to BID for 2 weeks, then Qdaily for 2 weeks, then stop  - cont AT's QID and ointment at bedtime             - post op drops reviewed   - f/u 6-8 weeks -- DFE/OCT  3. Pseudophakia OU  - s/p CE/IOL OU (Dr Talbert Forest, 2013) -- monovision with OS being near eye  -  beautiful surgeries, doing well  - monitor  4. PCO OU  - OD: trace, non-central  - OS s/p YAG cap 11.13.20 - nice opening in place  - stable, monitor   Ophthalmic Meds Ordered this visit:  No orders of the defined types were placed in this encounter.      Return for f/u 6-8 weeks FTMH OD, DFE, OCT.  There are no Patient Instructions on file for this visit.   Explained the diagnoses, plan, and follow up with the patient and they expressed understanding.  Patient expressed understanding of the importance of proper follow up care.   This document serves as a record of services  personally performed by Gardiner Sleeper, MD, PhD. It was created on their behalf by Ernest Mallick, OA, an ophthalmic assistant. The creation of this record is the provider's dictation and/or activities during the visit.    Electronically signed by: Ernest Mallick, OA 03.17.2021 12:16 PM  Gardiner Sleeper, M.D., Ph.D. Diseases & Surgery of the Retina and Vitreous [facility]  I have reviewed the above documentation for accuracy and completeness, and I agree with the above. Gardiner Sleeper, M.D., Ph.D. 07/28/19 12:16 PM    Abbreviations: M myopia (nearsighted); A astigmatism; H hyperopia (farsighted); P presbyopia; Mrx spectacle prescription;  CTL contact lenses; OD right eye; OS left eye; OU both eyes  XT exotropia; ET esotropia; PEK punctate epithelial keratitis; PEE punctate epithelial erosions; DES dry eye syndrome; MGD meibomian gland dysfunction; ATs artificial tears; PFAT's preservative free artificial tears; [place] nuclear sclerotic cataract; PSC posterior subcapsular cataract; ERM epi-retinal membrane; PVD posterior vitreous detachment; RD retinal detachment; DM diabetes mellitus; DR diabetic retinopathy; NPDR non-proliferative diabetic retinopathy; PDR proliferative diabetic retinopathy; CSME clinically significant macular edema; DME diabetic macular edema; dbh dot blot hemorrhages; CWS cotton wool spot; POAG primary open angle glaucoma; C/D cup-to-disc ratio; HVF humphrey visual field; GVF goldmann visual field; OCT optical coherence tomography; IOP intraocular pressure; BRVO Branch retinal vein occlusion; CRVO central retinal vein occlusion; CRAO central retinal artery occlusion; BRAO branch retinal artery occlusion; RT retinal tear; SB scleral buckle; PPV pars plana vitrectomy; VH Vitreous hemorrhage; PRP panretinal laser photocoagulation; IVK intravitreal kenalog; VMT vitreomacular traction; MH Macular hole;  NVD neovascularization of the disc; NVE neovascularization  elsewhere; AREDS age related eye disease study; ARMD age related macular degeneration; POAG primary open angle glaucoma; EBMD epithelial/anterior basement membrane dystrophy; ACIOL anterior chamber intraocular lens; IOL intraocular lens; PCIOL posterior chamber intraocular lens; Phaco/IOL phacoemulsification with intraocular lens placement; [place] photorefractive keratectomy; LASIK laser assisted in situ keratomileusis; HTN hypertension; DM diabetes mellitus; COPD chronic obstructive pulmonary disease

## 2019-07-28 ENCOUNTER — Ambulatory Visit (INDEPENDENT_AMBULATORY_CARE_PROVIDER_SITE_OTHER): Payer: Medicare HMO | Admitting: Ophthalmology

## 2019-07-28 ENCOUNTER — Other Ambulatory Visit: Payer: Self-pay

## 2019-07-28 ENCOUNTER — Encounter (INDEPENDENT_AMBULATORY_CARE_PROVIDER_SITE_OTHER): Payer: Self-pay | Admitting: Ophthalmology

## 2019-07-28 DIAGNOSIS — Z961 Presence of intraocular lens: Secondary | ICD-10-CM

## 2019-07-28 DIAGNOSIS — H3581 Retinal edema: Secondary | ICD-10-CM

## 2019-07-28 DIAGNOSIS — H26493 Other secondary cataract, bilateral: Secondary | ICD-10-CM

## 2019-07-28 DIAGNOSIS — H35373 Puckering of macula, bilateral: Secondary | ICD-10-CM

## 2019-07-31 ENCOUNTER — Other Ambulatory Visit: Payer: Self-pay | Admitting: Physician Assistant

## 2019-07-31 MED ORDER — OMEPRAZOLE 20 MG PO CPDR: 20.0000 mg | DELAYED_RELEASE_CAPSULE | Freq: Every day | ORAL | 0 refills | Status: DC

## 2019-07-31 NOTE — Telephone Encounter (Signed)
Medication Refill - Medication: omeprazole  Has the patient contacted their pharmacy? Yes.   (Agent: If no, request that the patient contact the pharmacy for the refill.) (Agent: If yes, when and what did the pharmacy advise?)  Preferred Pharmacy (with phone number or street name):  [facility] ([facility] [place] [facility] [place] [facility] [address]  Phone: [phone] Fax: [phone]  Not a 24 hour pharmacy; exact hours not known.     Agent: Please be advised that RX refills may take up to 3 business days. We ask that you follow-up with your pharmacy.

## 2019-08-02 ENCOUNTER — Other Ambulatory Visit: Payer: Self-pay | Admitting: Physician Assistant

## 2019-08-15 ENCOUNTER — Other Ambulatory Visit: Payer: Self-pay | Admitting: Physician Assistant

## 2019-08-15 DIAGNOSIS — Z1231 Encounter for screening mammogram for malignant neoplasm of breast: Secondary | ICD-10-CM

## 2019-08-28 ENCOUNTER — Ambulatory Visit
Admission: RE | Admit: 2019-08-28 | Discharge: 2019-08-28 | Disposition: A | Discharge status: Home / Self Care | Payer: Medicare HMO | Source: Ambulatory Visit | Attending: Physician Assistant | Admitting: Physician Assistant

## 2019-08-28 ENCOUNTER — Other Ambulatory Visit: Payer: Self-pay

## 2019-08-28 DIAGNOSIS — Z1231 Encounter for screening mammogram for malignant neoplasm of breast: Secondary | ICD-10-CM

## 2019-09-05 NOTE — Progress Notes (Signed)
[facility] Note  09/08/2019     CHIEF COMPLAINT Patient presents for Retina Follow Up   HISTORY OF PRESENT ILLNESS: Hailey Cain is a 74 y.o. female who presents to the clinic today for:   HPI    Retina Follow Up    Patient presents with  Other.  In both eyes.  This started months ago.  Severity is moderate.  Duration of 6 weeks.  Since onset it is stable.  I, the attending physician,  performed the HPI with the patient and updated documentation appropriately.          Comments    74 y/o female pt here for 6 wk f/u for ERM OU.  S/p PPV/MP OS 01.14.21.  No change in [place] OU noticed.  Denies pain, FOL, floaters.  AT QID and ung QHS OS.  LUL often feels "heavy."       Last edited by Bernarda Caffey, MD on 09/08/2019 10:49 AM. (History)    pt states she sees floaters OS occasionally, she completed PF taper and is not on any drops right now   Referring physician: Trinna Post, PA-C [address] [place],  [place] [address]  HISTORICAL INFORMATION:   Selected notes from the MEDICAL RECORD NUMBER Referred by Dr. Orion Modest for ERM   CURRENT MEDICATIONS: Current Outpatient Medications (Ophthalmic Drugs)  Medication Sig  . bacitracin-polymyxin b (POLYSPORIN) ophthalmic ointment 1 application at bedtime as needed. At bedtime and as needed throughout the day  . Polyethyl Glycol-Propyl Glycol (LUBRICANT EYE DROPS) 0.4-0.3 % SOLN Place 1 drop into both eyes 2 (two) times daily.  . prednisoLONE acetate (PRED FORTE) 1 % ophthalmic suspension Place 1 drop into the left eye 4 (four) times daily.   No current facility-administered medications for this visit. (Ophthalmic Drugs)   Current Outpatient Medications (Other)  Medication Sig  . alendronate (FOSAMAX) 70 MG tablet Take 1 tablet (70 mg total) by mouth every 7 (seven) days. Take with a full glass of water on an empty stomach. (Patient taking differently: Take 70 mg by mouth every  Wednesday. Take with a full glass of water on an empty stomach.)  . aspirin EC 325 MG tablet Take 325 mg by mouth daily.  . Calcium Carbonate (CALCIUM 600 PO) Take 600 mg by mouth 2 (two) times a week.   . Cholecalciferol (VITAMIN D-3) 25 MCG (1000 UT) CAPS Take 1,000 Units by mouth daily.  Marland Kitchen FLUoxetine (PROZAC) 20 MG capsule TAKE 2 CAPSULES (40 MG TOTAL) BY MOUTH ONCE DAILY (Patient taking differently: Take 20 mg by mouth 2 (two) times daily. TAKE 2 CAPSULES (40 MG TOTAL) BY MOUTH ONCE DAILY)  . levothyroxine (SYNTHROID) 88 MCG tablet TAKE 1 TABLET BY MOUTH ONCE DAILY BEFORE BREAKFAST (Patient taking differently: Take 88 mcg by mouth daily before breakfast. )  . loratadine (CLARITIN) 10 MG tablet Take 1 tablet (10 mg total) by mouth daily. Reported on 05/17/2015 (Patient taking differently: Take 10 mg by mouth daily as needed for allergies. Reported on 05/17/2015)  . Multiple Vitamins-Minerals (EMERGEN-C IMMUNE PO) Take 1 tablet by mouth daily as needed (immunity boost).   Marland Kitchen omeprazole (PRILOSEC) 20 MG capsule Take 1 capsule (20 mg total) by mouth daily.  . simvastatin (ZOCOR) 40 MG tablet Take 1 tablet (40 mg total) by mouth daily.  . vitamin B-12 (CYANOCOBALAMIN) 500 MCG tablet Take 500 mcg by mouth daily.   No current facility-administered medications for this visit. (Other)  REVIEW OF SYSTEMS: ROS    Positive for: Gastrointestinal, Musculoskeletal, Eyes, Respiratory   Negative for: Constitutional, Neurological, Skin, Genitourinary, HENT, Endocrine, Cardiovascular, Psychiatric, Allergic/Imm, Heme/Lymph   Last edited by Matthew Folks, COA on 09/08/2019  9:30 AM. (History)       ALLERGIES No Known Allergies  PAST MEDICAL HISTORY Past Medical History:  Diagnosis Date  . Acid reflux 12/30/2006  . Adult hypothyroidism 01/13/2007  . Allergic rhinitis 09/07/2014  . Anxiety 12/10/2014  . Bronchitis 05/17/2015  . Cancer of cheek ([place]) 07/04/2007  . Depression, neurotic 12/30/2006  .  Hyperlipidemia   . Prediabetes 08/11/2016  . RAD (reactive airway disease) 09/07/2014  . Small bowel obstruction ([place]) 01/31/2017   Past Surgical History:  Procedure Laterality Date  . ABDOMINAL HYSTERECTOMY  1988   heavy, irregular menses; ovaries intact  . AUGMENTATION MAMMAPLASTY Bilateral    silicone  . BREAST SURGERY  1970   augmentation  . CATARACT EXTRACTION Bilateral 2013   Dr. Talbert Forest.  [place].  OD-Distance.  Marland Kitchen EYE SURGERY     cataract extraction  . EYE SURGERY  2007   LASIK  . MEMBRANE PEEL Left 05/25/2019   Procedure: MEMBRANE PEEL;  Surgeon: Bernarda Caffey, MD;  Location: [facility];  Service: Ophthalmology;  Laterality: Left;  . PARS PLANA VITRECTOMY Left 05/25/2019   Procedure: PARS PLANA VITRECTOMY WITH 25 GAUGE;  Surgeon: Bernarda Caffey, MD;  Location: [facility] [facility];  Service: Ophthalmology;  Laterality: Left;  . TONSILLECTOMY AND ADENOIDECTOMY  1950  . TUBAL LIGATION    . YAG LASER APPLICATION Left [id number]   Dr. Bernarda Caffey    FAMILY HISTORY Family History  Problem Relation Age of Onset  . Stroke Mother   . CAD Mother   . Heart attack Father   . Hyperthyroidism Sister   . Heart Problems Brother   . Cancer Brother        skin  . CAD Brother        CABG    SOCIAL HISTORY Social History   Tobacco Use  . Smoking status: Former Smoker    Packs/day: 1.00    Years: 20.00    Pack years: 20.00    Quit date: 05/12/2003    Years since quitting: 16.3  . Smokeless tobacco: Never Used  Substance Use Topics  . Alcohol use: No    Alcohol/week: 0.0 standard drinks  . Drug use: No         OPHTHALMIC EXAM:  Base Eye Exam    Visual Acuity (Snellen - Linear)      Right Left   Dist [place] 20/20 -2 20/80 +2   Dist ph [place]  20/60 -2       Tonometry (Tonopen, 9:32 AM)      Right Left   Pressure 12 14       Pupils      Dark Light Shape React APD   Right 3 2 Round Brisk None   Left 4 3 Round Minimal None       Visual Fields (Counting fingers)      Left Right    Full Full        Extraocular Movement      Right Left    Full, Ortho Full, Ortho       Neuro/Psych    Oriented x3: Yes   Mood/Affect: Normal       Dilation    Both eyes: 1.0% Mydriacyl, 2.5% Phenylephrine @ 9:32 AM  Slit Lamp and Fundus Exam    Slit Lamp Exam      Right Left   Lids/Lashes Dermatochalasis - upper lid, mild Meibomian gland dysfunction Dermatochalasis - upper lid, mild Ptosis   Conjunctiva/Sclera Trace nasal Pinguecula sutures dissolving, white and quiet   Cornea Arcus, mild Debris in tear film Trace PEE, mild Debris in tear film   Anterior Chamber Deep and quiet deep and clear   Iris Round and dilated round and dilated   Lens PC IOL in good position, trace, non-central Posterior capsular opacification PC IOL in good position with open PC   Vitreous Vitreous syneresis post-vitrectomy       Fundus Exam      Right Left   Disc Pink and Sharp Mild pallor, sharp rim, temporal PPA   C/D Ratio 0.2 0.4   Macula Flat, Good foveal reflex, parafoveal Drusen, mild Retinal pigment epithelial mottling flat, ERM and PRF gone, Blunted foveal reflex, Retinal pigment epithelial mottling, central thickening stable   Vessels Vascular attenuation, mild Tortuousity attenuation, mild tortuousity   Periphery Attached, No heme  attached, good peripheral laser changes 360          IMAGING AND PROCEDURES  Imaging and Procedures for @TODAY @  OCT, Retina - OU - Both Eyes       Right Eye Quality was good. Central Foveal Thickness: 272. Progression has been stable. Findings include abnormal foveal contour, no IRF, retinal drusen , no SRF, epiretinal membrane (Mild ERM greatest nasally).   Left Eye Quality was good. Central Foveal Thickness: 371. Progression has improved. Findings include abnormal foveal contour, no IRF, no SRF, retinal drusen  (ERM gone; ellipsoid signal improving, irregular inner retinal surface).   Notes *Images captured and stored on drive  Diagnosis /  Impression:  Non-exu ARMD OU OD: Mild nasal ERM OD -- stable OS: ERM and PRF gone; ellipsoid signal improving, irregular inner retinal surface  Clinical management:  See below  Abbreviations: NFP - Normal foveal profile. CME - cystoid macular edema. PED - pigment epithelial detachment. IRF - intraretinal fluid. SRF - subretinal fluid. EZ - ellipsoid zone. ERM - epiretinal membrane. ORA - outer retinal atrophy. ORT - outer retinal tubulation. SRHM - subretinal hyper-reflective material                 ASSESSMENT/PLAN:    ICD-10-CM   1. Epiretinal membrane (ERM) of both eyes  H35.373   2. Retinal edema  H35.81 OCT, Retina - OU - Both Eyes  3. Pseudophakia of both eyes  Z96.1   4. Bilateral posterior capsular opacification  H26.493   5. Ptosis of eyelid, left  H02.402     1,2. Epiretinal membrane, OU  - OD: mild ERM, greatest nasally  - OS: with pucker and PRF  - OD asymptomatic, no metamorphopsia -- no indication for surgery at this time -- monitor  - OS +metamorophopsia with reading  - now POM3 OS s/p 25g PPV/TissueBlue stain/MP/20% SF6, 01.14.21             - doing well -- ERM/PRF gone and ellipsoid signal improving  - BCVA             - IOP okay at 14 mmHg             - completed taper PF  - cont AT's QID prn  - f/u 4-6 months -- DFE/OCT  3. Pseudophakia OU  - s/p CE/IOL OU (Dr Talbert Forest, 2013) -- monovision with OS being near eye  -  beautiful surgeries, doing well  - monitor  4. PCO OU  - OD: trace, non-central  - OS s/p YAG cap 11.13.20 - nice opening in place  - stable, monitor  5. Ptosis - LUL  - post PPV as above  - will refer to Dr. Sabino Dick at [facility] for eval and management   Ophthalmic Meds Ordered this visit:  No orders of the defined types were placed in this encounter.      Return for f/u 4-6 months, ERM OU, DFE, OCT.  There are no Patient Instructions on file for this visit.   Explained the diagnoses, plan, and follow up with the  patient and they expressed understanding.  Patient expressed understanding of the importance of proper follow up care.   This document serves as a record of services personally performed by Gardiner Sleeper, MD, PhD. It was created on their behalf by Ernest Mallick, OA, an ophthalmic assistant. The creation of this record is the provider's dictation and/or activities during the visit.    Electronically signed by: Ernest Mallick, OA 04.27.2021 1:03 PM  Gardiner Sleeper, M.D., Ph.D. Diseases & Surgery of the Retina and Vitreous [facility]  I have reviewed the above documentation for accuracy and completeness, and I agree with the above. Gardiner Sleeper, M.D., Ph.D. 09/08/19 1:03 PM   Abbreviations: M myopia (nearsighted); A astigmatism; H hyperopia (farsighted); P presbyopia; Mrx spectacle prescription;  CTL contact lenses; OD right eye; OS left eye; OU both eyes  XT exotropia; ET esotropia; PEK punctate epithelial keratitis; PEE punctate epithelial erosions; DES dry eye syndrome; MGD meibomian gland dysfunction; ATs artificial tears; PFAT's preservative free artificial tears; [place] nuclear sclerotic cataract; PSC posterior subcapsular cataract; ERM epi-retinal membrane; PVD posterior vitreous detachment; RD retinal detachment; DM diabetes mellitus; DR diabetic retinopathy; NPDR non-proliferative diabetic retinopathy; PDR proliferative diabetic retinopathy; CSME clinically significant macular edema; DME diabetic macular edema; dbh dot blot hemorrhages; CWS cotton wool spot; POAG primary open angle glaucoma; C/D cup-to-disc ratio; HVF humphrey visual field; GVF goldmann visual field; OCT optical coherence tomography; IOP intraocular pressure; BRVO Branch retinal vein occlusion; CRVO central retinal vein occlusion; CRAO central retinal artery occlusion; BRAO branch retinal artery occlusion; RT retinal tear; SB scleral buckle; PPV pars plana vitrectomy; VH Vitreous hemorrhage; PRP panretinal  laser photocoagulation; IVK intravitreal kenalog; VMT vitreomacular traction; MH Macular hole;  NVD neovascularization of the disc; NVE neovascularization elsewhere; AREDS age related eye disease study; ARMD age related macular degeneration; POAG primary open angle glaucoma; EBMD epithelial/anterior basement membrane dystrophy; ACIOL anterior chamber intraocular lens; IOL intraocular lens; PCIOL posterior chamber intraocular lens; Phaco/IOL phacoemulsification with intraocular lens placement; [place] photorefractive keratectomy; LASIK laser assisted in situ keratomileusis; HTN hypertension; DM diabetes mellitus; COPD chronic obstructive pulmonary disease

## 2019-09-08 ENCOUNTER — Other Ambulatory Visit: Payer: Self-pay

## 2019-09-08 ENCOUNTER — Ambulatory Visit (INDEPENDENT_AMBULATORY_CARE_PROVIDER_SITE_OTHER): Payer: Medicare HMO | Admitting: Ophthalmology

## 2019-09-08 ENCOUNTER — Encounter (INDEPENDENT_AMBULATORY_CARE_PROVIDER_SITE_OTHER): Payer: Self-pay | Admitting: Ophthalmology

## 2019-09-08 DIAGNOSIS — H26493 Other secondary cataract, bilateral: Secondary | ICD-10-CM

## 2019-09-08 DIAGNOSIS — H02402 Unspecified ptosis of left eyelid: Secondary | ICD-10-CM

## 2019-09-08 DIAGNOSIS — H35373 Puckering of macula, bilateral: Secondary | ICD-10-CM

## 2019-09-08 DIAGNOSIS — Z961 Presence of intraocular lens: Secondary | ICD-10-CM

## 2019-09-08 DIAGNOSIS — H3581 Retinal edema: Secondary | ICD-10-CM

## 2019-09-12 DIAGNOSIS — R69 Illness, unspecified: Secondary | ICD-10-CM | POA: Diagnosis not present

## 2019-09-18 ENCOUNTER — Other Ambulatory Visit: Payer: Self-pay | Admitting: Physician Assistant

## 2019-09-18 DIAGNOSIS — E039 Hypothyroidism, unspecified: Secondary | ICD-10-CM

## 2019-09-18 DIAGNOSIS — F329 Major depressive disorder, single episode, unspecified: Secondary | ICD-10-CM

## 2019-09-18 DIAGNOSIS — F32A Depression, unspecified: Secondary | ICD-10-CM

## 2019-09-18 MED ORDER — OMEPRAZOLE 20 MG PO CPDR: 20.0000 mg | DELAYED_RELEASE_CAPSULE | Freq: Every day | ORAL | 1 refills | Status: DC

## 2019-09-18 MED ORDER — FLUOXETINE HCL 20 MG PO CAPS: ORAL_CAPSULE | ORAL | 0 refills | Status: DC

## 2019-09-18 MED ORDER — LEVOTHYROXINE SODIUM 88 MCG PO TABS: ORAL_TABLET | ORAL | 1 refills | Status: DC

## 2019-09-18 NOTE — Telephone Encounter (Signed)
Medication Refill - Medication: levothyroxine (SYNTHROID) 88 MCG tablet  omeprazole (PRILOSEC) 20 MG capsule    Medication reaction: FLUoxetine (PROZAC) 20 MG capsule  Gives patient indigestion, patient is requesting an alternative   Has the patient contacted their pharmacy? Yes.   (Agent: If no, request that the patient contact the pharmacy for the refill.) (Agent: If yes, when and what did the pharmacy advise?)  Preferred Pharmacy (with phone number or street name):  [facility] (N), [place]  [address]  Phone: [phone] Fax: [phone]     Agent: Please be advised that RX refills may take up to 3 business days. We ask that you follow-up with your pharmacy.

## 2019-09-18 NOTE — Telephone Encounter (Signed)
See note below re: Prozac

## 2019-09-18 NOTE — Telephone Encounter (Signed)
L.O.V. was on 11/17/2018 and upcoming appointment on 10/10/2019. Medication send into pharmacy.

## 2019-09-18 NOTE — Telephone Encounter (Signed)
Requested medication (s) are due for refill today: yes  Requested medication (s) are on the active medication list: es  Last refill:  levothyroxine: 01/02/19    Omeprazole: 07/31/19  Future visit scheduled: yes  Notes to clinic:  overdue labs (levothyroxine) omeprazole: prescription expired 10/29/19   Requested Prescriptions  Pending Prescriptions Disp Refills   levothyroxine (SYNTHROID) 88 MCG tablet 90 tablet 1      Endocrinology:  Hypothyroid Agents Failed - 09/18/2019 10:53 AM      Failed - TSH needs to be rechecked within 3 months after an abnormal result. Refill until TSH is due.      Failed - TSH in normal range and within 360 days    TSH  Date Value Ref Range Status  10/05/2018 7.510 (H) 0.450 - 4.500 uIU/mL Final          Passed - Valid encounter within last 12 months    Recent Outpatient Visits           3 months ago Pre-operative clearance   [facility] Carles Collet M, [place]   10 months ago Adult hypothyroidism   [place], [place], [place]   1 year ago Viral URI with cough   [facility] Trinna Post, [place]   1 year ago Prediabetes   [place], [place], [place]   1 year ago Other osteoporosis, unspecified pathological fracture presence   [facility] Trinna Post, [place]       Future Appointments             In 3 weeks  [facility], [place]   In 3 weeks Pollak, Adriana M, PA-C [facility], [facility]              omeprazole (PRILOSEC) 20 MG capsule 90 capsule     Sig: Take 1 capsule (20 mg total) by mouth daily.      Gastroenterology: Proton Pump Inhibitors Passed - 09/18/2019 10:53 AM      Passed - Valid encounter within last 12 months    Recent Outpatient Visits           3 months ago Pre-operative clearance   [facility] Carles Collet M, [place]   10 months ago Adult hypothyroidism   [facility]  [place], Wendee Beavers, [place]   1 year ago Viral URI with cough   [facility] Trinna Post, [place]   1 year ago Prediabetes   [place], Wendee Beavers, [place]   1 year ago Other osteoporosis, unspecified pathological fracture presence   [facility] Trinna Post, PA-C       Future Appointments             In 3 weeks  [facility], [place]   In 3 weeks Pollak, Adriana M, PA-C [facility], [facility]             Signed Prescriptions Disp Refills   FLUoxetine (PROZAC) 20 MG capsule 180 capsule 0    Sig: TAKE 2 CAPSULES (40 MG TOTAL) BY MOUTH ONCE DAILY      Psychiatry:  Antidepressants - SSRI Passed - 09/18/2019 10:53 AM      Passed - Completed PHQ-2 or PHQ-9 in the last 360 days.      Passed - Valid encounter within last 6 months    Recent Outpatient Visits           3 months  ago Pre-operative clearance   [facility] Carles Collet M, [place]   10 months ago Adult hypothyroidism   [facility] Carles Collet M, [place]   1 year ago Viral URI with cough   [facility] Trinna Post, [place]   1 year ago Prediabetes   [place], [place], [place]   1 year ago Other osteoporosis, unspecified pathological fracture presence   [place], PA-C       Future Appointments             In 3 weeks  [facility], [place]   In 3 weeks Trinna Post, PA-C [facility], [facility]

## 2019-09-18 NOTE — Telephone Encounter (Signed)
Requested Prescriptions  Pending Prescriptions Disp Refills  . levothyroxine (SYNTHROID) 88 MCG tablet 90 tablet 1     Endocrinology:  Hypothyroid Agents Failed - 09/18/2019 10:53 AM      Failed - TSH needs to be rechecked within 3 months after an abnormal result. Refill until TSH is due.      Failed - TSH in normal range and within 360 days    TSH  Date Value Ref Range Status  10/05/2018 7.510 (H) 0.450 - 4.500 uIU/mL Final         Passed - Valid encounter within last 12 months    Recent Outpatient Visits          3 months ago Pre-operative clearance   [facility] Carles Collet M, [place]   10 months ago Adult hypothyroidism   [place], [place], [place]   1 year ago Viral URI with cough   [facility] Trinna Post, [place]   1 year ago Prediabetes   [place], [place], [place]   1 year ago Other osteoporosis, unspecified pathological fracture presence   [facility] Trinna Post, PA-C      Future Appointments            In 3 weeks  [facility], [place]   In 3 weeks Trinna Post, PA-C [facility], [facility]           . omeprazole (PRILOSEC) 20 MG capsule 90 capsule     Sig: Take 1 capsule (20 mg total) by mouth daily.     Gastroenterology: Proton Pump Inhibitors Passed - 09/18/2019 10:53 AM      Passed - Valid encounter within last 12 months    Recent Outpatient Visits          3 months ago Pre-operative clearance   [facility] Carles Collet M, [place]   10 months ago Adult hypothyroidism   [place], Wendee Beavers, [place]   1 year ago Viral URI with cough   [facility] Trinna Post, [place]   1 year ago Prediabetes   [place], Wendee Beavers, [place]   1 year ago Other osteoporosis, unspecified pathological fracture presence   [facility] Trinna Post, PA-C      Future Appointments            In 3 weeks  [facility], [place]   In 3 weeks Trinna Post, PA-C [facility], [facility]           . FLUoxetine (PROZAC) 20 MG capsule 180 capsule 0    Sig: TAKE 2 CAPSULES (40 MG TOTAL) BY MOUTH ONCE DAILY     Psychiatry:  Antidepressants - SSRI Passed - 09/18/2019 10:53 AM      Passed - Completed PHQ-2 or PHQ-9 in the last 360 days.      Passed - Valid encounter within last 6 months    Recent Outpatient Visits          3 months ago Pre-operative clearance   [facility] Carles Collet M, [place]   10 months ago Adult hypothyroidism   [place], Wendee Beavers, [place]   1 year ago Viral URI with cough   [facility] Trinna Post, [place]   1 year ago Prediabetes   [place], [place], [place]   1 year ago Other osteoporosis, unspecified pathological fracture  presence   [facility] [place], Wendee Beavers, [place]      Future Appointments            In 3 weeks  [facility], [place]   In 3 weeks Trinna Post, PA-C [facility], [place]

## 2019-10-04 NOTE — Progress Notes (Signed)
Subjective:   Hailey Cain is a 74 y.o. female who presents for Medicare Annual (Subsequent) preventive examination.  Review of Systems:  N/A  Cardiac Risk Factors include: advanced age (>3men, >23 women);dyslipidemia     Objective:     Vitals: BP 130/68 (BP Location: Right Arm)   Pulse 63   Temp 97.7 F (36.5 C) (Temporal)   Ht 5\' 2"  (1.575 m)   Wt 117 lb 9.6 oz (53.3 kg)   BMI 21.51 kg/m   Body mass index is 21.51 kg/m.  Advanced Directives 10/10/2019 05/25/2019 10/05/2018 09/28/2017 01/31/2017 01/31/2017 08/11/2016  Does Patient Have a Medical Advance Directive? No No Yes Yes No No Yes  Type of Advance Directive - Public librarian;Living will [place]  Does patient want to make changes to medical advance directive? - - - - - - Yes (ED - Information included in AVS)  Copy of [place] in Chart? - - No - copy requested No - copy requested - - No - copy requested  Would patient like information on creating a medical advance directive? No - Patient declined Yes (Inpatient - patient requests chaplain consult to create a medical advance directive) - - No - Patient declined - -    Tobacco Social History   Tobacco Use  Smoking Status Former Smoker  . Packs/day: 1.00  . Years: 20.00  . Pack years: 20.00  . Quit date: 05/12/2003  . Years since quitting: 16.4  Smokeless Tobacco Never Used     Counseling given: Not Answered   Clinical Intake:  Pre-visit preparation completed: Yes  Pain : No/denies pain Pain Score: 0-No pain     Nutritional Status: BMI of 19-24  Normal Nutritional Risks: None Diabetes: No  How often do you need to have someone help you when you read instructions, pamphlets, or other written materials from your doctor or pharmacy?: 1 - Never  Interpreter Needed?: No  Information entered by :: [facility], LPN  Past Medical History:  Diagnosis Date  . Acid  reflux 12/30/2006  . Adult hypothyroidism 01/13/2007  . Allergic rhinitis 09/07/2014  . Anxiety 12/10/2014  . Bronchitis 05/17/2015  . Cancer of cheek ([place]) 07/04/2007  . Depression, neurotic 12/30/2006  . Hyperlipidemia   . Prediabetes 08/11/2016  . RAD (reactive airway disease) 09/07/2014  . Small bowel obstruction ([place]) 01/31/2017   Past Surgical History:  Procedure Laterality Date  . ABDOMINAL HYSTERECTOMY  1988   heavy, irregular menses; ovaries intact  . AUGMENTATION MAMMAPLASTY Bilateral    silicone  . BREAST SURGERY  1970   augmentation  . CATARACT EXTRACTION Bilateral 2013   Dr. Talbert Forest.  [place].  OD-Distance.  Marland Kitchen EYE SURGERY     cataract extraction  . EYE SURGERY  2007   LASIK  . MEMBRANE PEEL Left 05/25/2019   Procedure: MEMBRANE PEEL;  Surgeon: Bernarda Caffey, MD;  Location: [facility];  Service: Ophthalmology;  Laterality: Left;  . PARS PLANA VITRECTOMY Left 05/25/2019   Procedure: PARS PLANA VITRECTOMY WITH 25 GAUGE;  Surgeon: Bernarda Caffey, MD;  Location: [facility];  Service: Ophthalmology;  Laterality: Left;  . TONSILLECTOMY AND ADENOIDECTOMY  1950  . TUBAL LIGATION    . YAG LASER APPLICATION Left [id number]   Dr. Bernarda Caffey   Family History  Problem Relation Age of Onset  . Stroke Mother   . CAD Mother   . Heart attack Father   . Hyperthyroidism Sister   .  Heart Problems Brother   . Cancer Brother        skin  . CAD Brother        CABG   Social History   Socioeconomic History  . Marital status: Widowed    Spouse name: Laban Emperor  . Number of children: 1  . Years of education: H/S  . Highest education level: 12th grade  Occupational History  . Occupation: retired  Tobacco Use  . Smoking status: Former Smoker    Packs/day: 1.00    Years: 20.00    Pack years: 20.00    Quit date: 05/12/2003    Years since quitting: 16.4  . Smokeless tobacco: Never Used  Substance and Sexual Activity  . Alcohol use: No    Alcohol/week: 0.0 standard drinks  . Drug use: No  . Sexual  activity: Not on file  Other Topics Concern  . Not on file  Social History Narrative  . Not on file   Social Determinants of Health   Financial Resource Strain: Low Risk   . Difficulty of Paying Living Expenses: Not hard at all  Food Insecurity: No Food Insecurity  . Worried About Charity fundraiser in the Last Year: Never true  . Ran Out of Food in the Last Year: Never true  Transportation Needs: No Transportation Needs  . Lack of Transportation (Medical): No  . Lack of Transportation (Non-Medical): No  Physical Activity: Insufficiently Active  . Days of Exercise per Week: 2 days  . Minutes of Exercise per Session: 10 min  Stress: No Stress Concern Present  . Feeling of Stress : Only a little  Social Connections: Moderately Isolated  . Frequency of Communication with Friends and Family: More than three times a week  . Frequency of Social Gatherings with Friends and Family: More than three times a week  . Attends Religious Services: Never  . Active Member of Clubs or Organizations: No  . Attends Archivist Meetings: Never  . Marital Status: Widowed    Outpatient Encounter Medications as of 10/10/2019  Medication Sig  . alendronate (FOSAMAX) 70 MG tablet Take 1 tablet (70 mg total) by mouth every 7 (seven) days. Take with a full glass of water on an empty stomach. (Patient taking differently: Take 70 mg by mouth every Wednesday. Take with a full glass of water on an empty stomach.)  . aspirin EC 325 MG tablet Take 325 mg by mouth daily.  . bacitracin-polymyxin b (POLYSPORIN) ophthalmic ointment 1 application at bedtime as needed. At bedtime and as needed throughout the day  . Calcium Carbonate (CALCIUM 600 PO) Take 600 mg by mouth 2 (two) times a week.   . Cholecalciferol (VITAMIN D-3) 25 MCG (1000 UT) CAPS Take 1,000 Units by mouth daily.  Marland Kitchen FLUoxetine (PROZAC) 20 MG capsule TAKE 2 CAPSULES (40 MG TOTAL) BY MOUTH ONCE DAILY  . levothyroxine (SYNTHROID) 88 MCG tablet  TAKE 1 TABLET BY MOUTH ONCE DAILY BEFORE BREAKFAST  . loratadine (CLARITIN) 10 MG tablet Take 1 tablet (10 mg total) by mouth daily. Reported on 05/17/2015 (Patient taking differently: Take 10 mg by mouth daily as needed for allergies. Reported on 05/17/2015)  . Multiple Vitamins-Minerals (EMERGEN-C IMMUNE PO) Take 1 tablet by mouth daily as needed (immunity boost).   Marland Kitchen omeprazole (PRILOSEC) 20 MG capsule Take 1 capsule (20 mg total) by mouth daily.  Vladimir Faster Glycol-Propyl Glycol (LUBRICANT EYE DROPS) 0.4-0.3 % SOLN Place 1 drop into both eyes 2 (two) times daily.  Marland Kitchen  simvastatin (ZOCOR) 40 MG tablet Take 1 tablet (40 mg total) by mouth daily.  . vitamin B-12 (CYANOCOBALAMIN) 500 MCG tablet Take 500 mcg by mouth daily.  . prednisoLONE acetate (PRED FORTE) 1 % ophthalmic suspension Place 1 drop into the left eye 4 (four) times daily. (Patient not taking: Reported on 10/10/2019)   No facility-administered encounter medications on file as of 10/10/2019.    Activities of Daily Living In your present state of health, do you have any difficulty performing the following activities: 10/10/2019 05/25/2019  Hearing? Tempie Donning  Comment Wears bilateral hearing aids however needs new batteries for them. Advised pt to check with insurance company for coverage. -  Vision? N N  Difficulty concentrating or making decisions? N N  Walking or climbing stairs? N N  Dressing or bathing? N N  Doing errands, shopping? N -  Preparing Food and eating ? N -  Using the Toilet? N -  In the past six months, have you accidently leaked urine? N -  Do you have problems with loss of bowel control? N -  Managing your Medications? N -  Managing your Finances? N -  Housekeeping or managing your Housekeeping? N -  Some recent data might be hidden    Patient Care Team: Paulene Floor as PCP - General (Physician Assistant)    Assessment:   This is a routine wellness examination for [place].  Exercise Activities and Dietary  recommendations Current Exercise Habits: Home exercise routine, Type of exercise: strength training/weights;Other - see comments(running), Time (Minutes): 20, Frequency (Times/Week): 2, Weekly Exercise (Minutes/Week): 40, Intensity: Mild, Exercise limited by: None identified  Goals    . Exercise      Recommend starting back exercising at the gym 3 days a week for 1 hour.     . Exercise 3x per week (30 min per time)     Recommend to start back exercising 3 days a week for 30 minutes at a time. (Pt to start a silver sneakers class)       Fall Risk: Fall Risk  10/10/2019 10/05/2018 09/28/2017 09/09/2017 08/11/2016  Falls in the past year? 0 0 Yes No Yes  Number falls in past yr: 0 - 1 - 1  Comment - - - - tripped   Injury with Fall? 0 - No - No  Follow up - - Falls prevention discussed - Falls prevention discussed    FALL RISK PREVENTION PERTAINING TO THE HOME:  Any stairs in or around the home? Yes  If so, are there any without handrails? No   Home free of loose throw rugs in walkways, pet beds, electrical cords, etc? Yes  Adequate lighting in your home to reduce risk of falls? Yes   ASSISTIVE DEVICES UTILIZED TO PREVENT FALLS:  Life alert? No  Use of a cane, walker or w/c? No  Grab bars in the bathroom? No  Shower chair or bench in shower? No  Elevated toilet seat or a handicapped toilet? No    TIMED UP AND GO:  Was the test performed? No .    Depression Screen PHQ 2/9 Scores 10/10/2019 10/05/2018 03/29/2018 09/28/2017  PHQ - 2 Score 1 0 0 2  PHQ- 9 Score - - 0 7     Cognitive Function     6CIT Screen 10/10/2019 08/11/2016  What Year? 0 points 0 points  What month? 0 points 0 points  What time? 0 points 0 points  Count back from 20 0 points 0  points  Months in reverse 2 points 0 points  Repeat phrase 0 points 4 points  Total Score 2 4    Immunization History  Administered Date(s) Administered  . Influenza, High Dose Seasonal PF 02/13/2015, 02/08/2017, 02/04/2018  .  Influenza-Unspecified 02/06/2019  . Pneumococcal Conjugate-13 02/13/2015  . Pneumococcal Polysaccharide-23 05/01/2017  . Td 10/16/2010, 02/28/2018  . Tdap 10/16/2010, 02/28/2018  . Zoster 10/27/2014    Qualifies for Shingles Vaccine? Yes  Zostavax completed 10/27/14. Due for Shingrix. Pt has been advised to call insurance company to determine out of pocket expense. Advised may also receive vaccine at local pharmacy or Health Dept. Verbalized acceptance and understanding.  Tdap: Up to date  Flu Vaccine: Up to date  Pneumococcal Vaccine: Completed series  Screening Tests Health Maintenance  Topic Date Due  . COVID-19 Vaccine (1) Never done  . DEXA SCAN  12/01/2019  . INFLUENZA VACCINE  12/10/2019  . MAMMOGRAM  08/27/2021  . COLONOSCOPY  05/27/2022  . TETANUS/TDAP  02/29/2028  . Hepatitis C Screening  Completed  . PNA vac Low Risk Adult  Completed    Cancer Screenings:  Colorectal Screening: Completed 09/07/14. Repeat every 10 years.   Mammogram: Completed 08/28/19. Repeat every 1-2 years as advised.   Bone Density: Completed 11/30/17. Results reflect OSTEOPOROSIS. Repeat every 2 years.   Lung Cancer Screening: (Low Dose CT Chest recommended if Age 40-80 years, 30 pack-year currently smoking OR have quit w/in 15years.) does not qualify.   Additional Screening:  Hepatitis C Screening: Up to date  Vision Screening: Recommended annual ophthalmology exams for early detection of glaucoma and other disorders of the eye.  Dental Screening: Recommended annual dental exams for proper oral hygiene  Community Resource Referral:  CRR required this visit? No     Plan:  I have personally reviewed and addressed the Medicare Annual Wellness questionnaire and have noted the following in the patient's chart:  A. Medical and social history B. Use of alcohol, tobacco or illicit drugs  C. Current medications and supplements D. Functional ability and status E.  Nutritional status F.    Physical activity G. Advance directives H. List of other physicians I.  Hospitalizations, surgeries, and ER visits in previous 12 months J.  [place] such as hearing and vision if needed, cognitive and depression L. Referrals and appointments   In addition, I have reviewed and discussed with patient certain preventive protocols, quality metrics, and best practice recommendations. A written personalized care plan for preventive services as well as general preventive health recommendations were provided to patient. Nurse Health Advisor  Signed,    Shrey Boike [place], [place]  [id number] Nurse Health Advisor   Nurse Notes: None.

## 2019-10-10 ENCOUNTER — Ambulatory Visit (INDEPENDENT_AMBULATORY_CARE_PROVIDER_SITE_OTHER): Payer: Medicare HMO

## 2019-10-10 ENCOUNTER — Other Ambulatory Visit: Payer: Self-pay

## 2019-10-10 ENCOUNTER — Ambulatory Visit (INDEPENDENT_AMBULATORY_CARE_PROVIDER_SITE_OTHER): Payer: Medicare HMO | Admitting: Physician Assistant

## 2019-10-10 ENCOUNTER — Encounter: Payer: Self-pay | Admitting: Physician Assistant

## 2019-10-10 VITALS — BP 130/68 | HR 63 | Temp 97.7°F | Ht 62.0 in | Wt 117.6 lb

## 2019-10-10 DIAGNOSIS — R252 Cramp and spasm: Secondary | ICD-10-CM | POA: Diagnosis not present

## 2019-10-10 DIAGNOSIS — Z0000 Encounter for general adult medical examination without abnormal findings: Secondary | ICD-10-CM

## 2019-10-10 DIAGNOSIS — R69 Illness, unspecified: Secondary | ICD-10-CM | POA: Diagnosis not present

## 2019-10-10 DIAGNOSIS — F32A Depression, unspecified: Secondary | ICD-10-CM

## 2019-10-10 DIAGNOSIS — F329 Major depressive disorder, single episode, unspecified: Secondary | ICD-10-CM | POA: Diagnosis not present

## 2019-10-10 DIAGNOSIS — C760 Malignant neoplasm of head, face and neck: Secondary | ICD-10-CM

## 2019-10-10 DIAGNOSIS — E7800 Pure hypercholesterolemia, unspecified: Secondary | ICD-10-CM

## 2019-10-10 DIAGNOSIS — E039 Hypothyroidism, unspecified: Secondary | ICD-10-CM | POA: Diagnosis not present

## 2019-10-10 MED ORDER — SIMVASTATIN 40 MG PO TABS: 40.0000 mg | ORAL_TABLET | Freq: Every day | ORAL | 3 refills | Status: DC

## 2019-10-10 MED ORDER — FLUOXETINE HCL 20 MG PO CAPS: ORAL_CAPSULE | ORAL | 3 refills | Status: DC

## 2019-10-10 NOTE — Progress Notes (Signed)
Complete physical exam   Patient: Hailey Cain   DOB: 03-02-1946   74 y.o. Female  MRN: [id number] Visit Date: 10/10/2019  Today's healthcare provider: Trinna Post, PA-C   Chief Complaint  Patient presents with  . Annual Exam  . Hypothyroidism  . Hyperlipidemia  . Depression  I,Hailey Cain,acting as a scribe for Trinna Post, PA-C.,have documented all relevant documentation on the behalf of Trinna Post, PA-C,as directed by  Trinna Post, PA-C while in the presence of Trinna Post, PA-C.  Subjective    Hailey Cain is a 74 y.o. female who presents today for a complete physical exam.  She reports consuming a low fat and low sodium diet. walking and jogging. She generally feels well. She reports sleeping well. She does have additional problems to discuss today. Patient reports cough, congestion, wheezing and SOB for about 3 weeks now. Patient reports she had her last COVID vaccine on 10/04/2019. She has been taking OTC cough/decongestion medication and reports that it has not really helped with symptoms. She takes Claritin for allergies and states some relieves.  HPI  AWE w/ [facility] was today @ 8:40 AM.  Hypothyroid, follow-up  Lab Results  Component Value Date   TSH 7.510 (H) 10/05/2018   TSH 4.240 09/09/2017   TSH 2.760 08/11/2016   T4TOTAL 4.5 02/11/2015   Wt Readings from Last 3 Encounters:  10/10/19 117 lb 9.6 oz (53.3 kg)  10/10/19 117 lb 9.6 oz (53.3 kg)  05/25/19 123 lb (55.8 kg)    She was last seen for hypothyroid 10 months ago.  Management since that visit includes increased Synthroid to 88 mcg QD. She reports good compliance with treatment. She is not having side effects.   Symptoms: No change in energy level No constipation  No diarrhea No heat / cold intolerance  No nervousness No palpitations  No weight changes     -----------------------------------------------------------------------------------------  Lipid/Cholesterol, Follow-up  Last lipid panel Other pertinent labs  Lab Results  Component Value Date   CHOL 186 03/29/2018   HDL 69 03/29/2018   LDLCALC 93 03/29/2018   TRIG 122 03/29/2018   CHOLHDL 2.7 03/29/2018   Lab Results  Component Value Date   ALT 8 05/23/2019   AST 18 05/23/2019   PLT 253 05/23/2019   TSH 7.510 (H) 10/05/2018     She was last seen for this 1 years ago.  Management since that visit includes no changes.  She reports good compliance with treatment. She is not having side effects.   Symptoms: No chest pain No chest pressure/discomfort  No dyspnea No lower extremity edema  No numbness or tingling of extremity No orthopnea  No palpitations No paroxysmal nocturnal dyspnea  No speech difficulty No syncope   Current diet: low fat/ cholesterol, low salt Current exercise: running/ jogging and walking  The 10-year ASCVD risk score Mikey Bussing DC Jr., et al., 2013) is: 13.1%  ---------------------------------------------------------------------------------------------------  Depression, Follow-up  She  was last seen for this 1 years ago. Changes made at last visit include no changes.   She reports good compliance with treatment. She is not having side effects.   She reports good tolerance of treatment. Current symptoms include: none She feels she is Improved since last visit.  Depression screen [facility] 2/9 10/10/2019 10/05/2018 03/29/2018  Decreased Interest 1 0 0  Down, Depressed, Hopeless 0 0 0  PHQ - 2 Score 1 0 0  Altered sleeping - - 0  Tired, decreased energy - - 0  Change in appetite - - 0  Feeling bad or failure about yourself  - - 0  Trouble concentrating - - 0  Moving slowly or fidgety/restless - - 0  Suicidal thoughts - - 0  PHQ-9 Score - - 0  Difficult doing work/chores - - Not difficult at all    Patient reports occasional muscle cramps in  feet at night. Previous trial of requip was not tolerated.  -----------------------------------------------------------------------------------------   Past Medical History:  Diagnosis Date  . Acid reflux 12/30/2006  . Adult hypothyroidism 01/13/2007  . Allergic rhinitis 09/07/2014  . Anxiety 12/10/2014  . Bronchitis 05/17/2015  . Cancer of cheek ([place]) 07/04/2007  . Depression, neurotic 12/30/2006  . Hyperlipidemia   . Prediabetes 08/11/2016  . RAD (reactive airway disease) 09/07/2014  . Small bowel obstruction ([place]) 01/31/2017   Past Surgical History:  Procedure Laterality Date  . ABDOMINAL HYSTERECTOMY  1988   heavy, irregular menses; ovaries intact  . AUGMENTATION MAMMAPLASTY Bilateral    silicone  . BREAST SURGERY  1970   augmentation  . CATARACT EXTRACTION Bilateral 2013   Dr. Talbert Forest.  [place].  OD-Distance.  Marland Kitchen EYE SURGERY     cataract extraction  . EYE SURGERY  2007   LASIK  . MEMBRANE PEEL Left 05/25/2019   Procedure: MEMBRANE PEEL;  Surgeon: Bernarda Caffey, MD;  Location: [facility];  Service: Ophthalmology;  Laterality: Left;  . PARS PLANA VITRECTOMY Left 05/25/2019   Procedure: PARS PLANA VITRECTOMY WITH 25 GAUGE;  Surgeon: Bernarda Caffey, MD;  Location: [place];  Service: Ophthalmology;  Laterality: Left;  . TONSILLECTOMY AND ADENOIDECTOMY  1950  . TUBAL LIGATION    . YAG LASER APPLICATION Left [id number]   Dr. Bernarda Caffey   Social History   Socioeconomic History  . Marital status: Widowed    Spouse name: Laban Emperor  . Number of children: 1  . Years of education: H/S  . Highest education level: 12th grade  Occupational History  . Occupation: retired  Tobacco Use  . Smoking status: Former Smoker    Packs/day: 1.00    Years: 20.00    Pack years: 20.00    Quit date: 05/12/2003    Years since quitting: 16.4  . Smokeless tobacco: Never Used  Substance and Sexual Activity  . Alcohol use: No    Alcohol/week: 0.0 standard drinks  . Drug use: No  . Sexual activity: Not on file   Other Topics Concern  . Not on file  Social History Narrative  . Not on file   Social Determinants of Health   Financial Resource Strain: Low Risk   . Difficulty of Paying Living Expenses: Not hard at all  Food Insecurity: No Food Insecurity  . Worried About Charity fundraiser in the Last Year: Never true  . Ran Out of Food in the Last Year: Never true  Transportation Needs: No Transportation Needs  . Lack of Transportation (Medical): No  . Lack of Transportation (Non-Medical): No  Physical Activity: Insufficiently Active  . Days of Exercise per Week: 2 days  . Minutes of Exercise per Session: 10 min  Stress: No Stress Concern Present  . Feeling of Stress : Only a little  Social Connections: Moderately Isolated  . Frequency of Communication with Friends and Family: More than three times a week  . Frequency of Social Gatherings with Friends and Family: More than three times a week  . Attends Religious Services: Never  . Active  Member of Clubs or Organizations: No  . Attends Archivist Meetings: Never  . Marital Status: Widowed  Intimate Partner Violence: Not At Risk  . Fear of Current or Ex-Partner: No  . Emotionally Abused: No  . Physically Abused: No  . Sexually Abused: No   Family Status  Relation Name Status  . Mother  Deceased at age 27  . Father  Deceased at age 80       Heart Attack  . Sister  Alive  . Brother  Alive  . Brother  Alive   Family History  Problem Relation Age of Onset  . Stroke Mother   . CAD Mother   . Heart attack Father   . Hyperthyroidism Sister   . Heart Problems Brother   . Cancer Brother        skin  . CAD Brother        CABG   No Known Allergies  Patient Care Team: Paulene Floor as PCP - General (Physician Assistant)   Medications: Outpatient Medications Prior to Visit  Medication Sig  . alendronate (FOSAMAX) 70 MG tablet Take 1 tablet (70 mg total) by mouth every 7 (seven) days. Take with a full glass of  water on an empty stomach. (Patient taking differently: Take 70 mg by mouth every Wednesday. Take with a full glass of water on an empty stomach.)  . aspirin EC 325 MG tablet Take 325 mg by mouth daily.  . bacitracin-polymyxin b (POLYSPORIN) ophthalmic ointment 1 application at bedtime as needed. At bedtime and as needed throughout the day  . Calcium Carbonate (CALCIUM 600 PO) Take 600 mg by mouth 2 (two) times a week.   . Cholecalciferol (VITAMIN D-3) 25 MCG (1000 UT) CAPS Take 1,000 Units by mouth daily.  Marland Kitchen levothyroxine (SYNTHROID) 88 MCG tablet TAKE 1 TABLET BY MOUTH ONCE DAILY BEFORE BREAKFAST  . loratadine (CLARITIN) 10 MG tablet Take 1 tablet (10 mg total) by mouth daily. Reported on 05/17/2015 (Patient taking differently: Take 10 mg by mouth daily as needed for allergies. Reported on 05/17/2015)  . Multiple Vitamins-Minerals (EMERGEN-C IMMUNE PO) Take 1 tablet by mouth daily as needed (immunity boost).   Marland Kitchen omeprazole (PRILOSEC) 20 MG capsule Take 1 capsule (20 mg total) by mouth daily.  Vladimir Faster Glycol-Propyl Glycol (LUBRICANT EYE DROPS) 0.4-0.3 % SOLN Place 1 drop into both eyes 2 (two) times daily.  . vitamin B-12 (CYANOCOBALAMIN) 500 MCG tablet Take 500 mcg by mouth daily.  . [DISCONTINUED] FLUoxetine (PROZAC) 20 MG capsule TAKE 2 CAPSULES (40 MG TOTAL) BY MOUTH ONCE DAILY  . [DISCONTINUED] simvastatin (ZOCOR) 40 MG tablet Take 1 tablet (40 mg total) by mouth daily.  . prednisoLONE acetate (PRED FORTE) 1 % ophthalmic suspension Place 1 drop into the left eye 4 (four) times daily. (Patient not taking: Reported on 10/10/2019)   No facility-administered medications prior to visit.    Review of Systems  Constitutional: Negative.   HENT: Positive for congestion.   Eyes: Negative.   Respiratory: Positive for cough, shortness of breath and wheezing.   Cardiovascular: Negative.   Gastrointestinal: Negative.   Endocrine: Negative.   Genitourinary: Negative.   Musculoskeletal: Negative.    Skin: Negative.   Allergic/Immunologic: Negative.   Neurological: Negative.   Hematological: Negative.   Psychiatric/Behavioral: Negative.       Objective    BP 130/68 (BP Location: Right Arm, Patient Position: Sitting, Cuff Size: Normal)   Pulse 63   Temp 97.7 F (36.5  C) (Temporal)   Ht 5\' 2"  (1.575 m)   Wt 117 lb 9.6 oz (53.3 kg)   SpO2 97%   BMI 21.51 kg/m    Physical Exam Constitutional:      Appearance: Normal appearance.  HENT:     Right Ear: Tympanic membrane, ear canal and external ear normal.     Left Ear: Tympanic membrane, ear canal and external ear normal.  Cardiovascular:     Rate and Rhythm: Normal rate and regular rhythm.     Pulses: Normal pulses.     Heart sounds: Normal heart sounds.  Pulmonary:     Effort: Pulmonary effort is normal.     Breath sounds: Normal breath sounds.  Abdominal:     General: Abdomen is flat. Bowel sounds are normal.     Palpations: Abdomen is soft.  Skin:    General: Skin is warm and dry.  Neurological:     General: No focal deficit present.     Mental Status: She is alert and oriented to person, place, and time.  Psychiatric:        Mood and Affect: Mood normal.        Behavior: Behavior normal.       Depression Screen  PHQ 2/9 Scores 10/10/2019 10/05/2018 03/29/2018  PHQ - 2 Score 1 0 0  PHQ- 9 Score - - 0    No results found for any visits on 10/10/19.  Assessment & Plan    1. Annual physical exam  - TSH - Lipid panel - Comprehensive metabolic panel - CBC with Differential/Platelet  2. Hypercholesterolemia without hypertriglyceridemia Previously well controlled Continue statin Repeat FLP and CMP Goal LDL < 100 - Lipid panel - Comprehensive metabolic panel - CBC with Differential/Platelet - simvastatin (ZOCOR) 40 MG tablet; Take 1 tablet (40 mg total) by mouth daily.  Dispense: 90 tablet; Refill: 3  3. Adult hypothyroidism Continue Synthroid at current dose  Recheck TSH and adjust Synthroid as  indicated  - TSH  4. Depression, unspecified depression type Medication refilled for 1 year. - FLUoxetine (PROZAC) 20 MG capsule; TAKE 2 CAPSULES (40 MG TOTAL) BY MOUTH ONCE DAILY  Dispense: 180 capsule; Refill: 3  5. Muscle cramps - Magnesium   Routine Health Maintenance and Physical Exam  Exercise Activities and Dietary recommendations Goals    . Exercise      Recommend starting back exercising at the gym 3 days a week for 1 hour.     . Exercise 3x per week (30 min per time)     Recommend to start back exercising 3 days a week for 30 minutes at a time. (Pt to start a silver sneakers class)       Immunization History  Administered Date(s) Administered  . Influenza, High Dose Seasonal PF 02/13/2015, 02/08/2017, 02/04/2018  . Influenza-Unspecified 02/06/2019  . Pneumococcal Conjugate-13 02/13/2015  . Pneumococcal Polysaccharide-23 05/01/2017  . Td 10/16/2010, 02/28/2018  . Tdap 10/16/2010, 02/28/2018  . Zoster 10/27/2014    Health Maintenance  Topic Date Due  . COVID-19 Vaccine (1) Never done  . DEXA SCAN  12/01/2019  . INFLUENZA VACCINE  12/10/2019  . MAMMOGRAM  08/27/2021  . COLONOSCOPY  05/27/2022  . TETANUS/TDAP  02/29/2028  . Hepatitis C Screening  Completed  . PNA vac Low Risk Adult  Completed    Discussed health benefits of physical activity, and encouraged her to engage in regular exercise appropriate for her age and condition.    Return in about 1 year (around  10/09/2020) for AWE&CPE.     ITrinna Post, PA-C, have reviewed all documentation for this visit. The documentation on 10/10/19 for the exam, diagnosis, procedures, and orders are all accurate and complete.    Paulene Floor  [facility] [phone] (phone) [phone] (fax)  [place]

## 2019-10-10 NOTE — Patient Instructions (Signed)
Ms. Hailey Cain , Thank you for taking time to come for your Medicare Wellness Visit. I appreciate your ongoing commitment to your health goals. Please review the following plan we discussed and let me know if I can assist you in the future.   Screening recommendations/referrals: Colonoscopy: Up to date, due 08/2024 Mammogram: Up to date, due 08/2021 Bone Density: Up to date, due 11/2019 Recommended yearly ophthalmology/optometry visit for glaucoma screening and checkup Recommended yearly dental visit for hygiene and checkup  Vaccinations: Influenza vaccine: Up to date Pneumococcal vaccine: Completed series Tdap vaccine: Up to date, due 02/2028 Shingles vaccine: Pt declines today.     Advanced directives: Advance directive discussed with you today. Even though you declined this today please call our office should you change your mind and we can give you the proper paperwork for you to fill out.  Conditions/risks identified: Recommend to increase exercising to 3 days a week for at least 30 minutes at a time.   Next appointment: 9:20 AM today with [place] 47 Years and Older, Female Preventive care refers to lifestyle choices and visits with your health care provider that can promote health and wellness. What does preventive care include?  A yearly physical exam. This is also called an annual well check.  Dental exams once or twice a year.  Routine eye exams. Ask your health care provider how often you should have your eyes checked.  Personal lifestyle choices, including:  Daily care of your teeth and gums.  Regular physical activity.  Eating a healthy diet.  Avoiding tobacco and drug use.  Limiting alcohol use.  Practicing safe sex.  Taking low-dose aspirin every day.  Taking vitamin and mineral supplements as recommended by your health care provider. What happens during an annual well check? The services and screenings done by your health care  provider during your annual well check will depend on your age, overall health, lifestyle risk factors, and family history of disease. Counseling  Your health care provider may ask you questions about your:  Alcohol use.  Tobacco use.  Drug use.  Emotional well-being.  Home and relationship well-being.  Sexual activity.  Eating habits.  History of falls.  Memory and ability to understand (cognition).  Work and work Statistician.  Reproductive health. Screening  You may have the following tests or measurements:  Height, weight, and BMI.  Blood pressure.  Lipid and cholesterol levels. These may be checked every 5 years, or more frequently if you are over 60 years old.  Skin check.  Lung cancer screening. You may have this screening every year starting at age 36 if you have a 30-pack-year history of smoking and currently smoke or have quit within the past 15 years.  Fecal occult blood test (FOBT) of the stool. You may have this test every year starting at age 55.  Flexible sigmoidoscopy or colonoscopy. You may have a sigmoidoscopy every 5 years or a colonoscopy every 10 years starting at age 66.  Hepatitis C blood test.  Hepatitis B blood test.  Sexually transmitted disease (STD) testing.  Diabetes screening. This is done by checking your blood sugar (glucose) after you have not eaten for a while (fasting). You may have this done every 1-3 years.  Bone density scan. This is done to screen for osteoporosis. You may have this done starting at age 79.  Mammogram. This may be done every 1-2 years. Talk to your health care provider about how often you should  have regular mammograms. Talk with your health care provider about your test results, treatment options, and if necessary, the need for more tests. Vaccines  Your health care provider may recommend certain vaccines, such as:  Influenza vaccine. This is recommended every year.  Tetanus, diphtheria, and acellular  pertussis (Tdap, Td) vaccine. You may need a Td booster every 10 years.  Zoster vaccine. You may need this after age 53.  Pneumococcal 13-valent conjugate (PCV13) vaccine. One dose is recommended after age 53.  Pneumococcal polysaccharide (PPSV23) vaccine. One dose is recommended after age 89. Talk to your health care provider about which screenings and vaccines you need and how often you need them. This information is not intended to replace advice given to you by your health care provider. Make sure you discuss any questions you have with your health care provider. Document Released: 05/24/2015 Document Revised: 01/15/2016 Document Reviewed: 02/26/2015 Elsevier Interactive Patient Education  2017 [place] Prevention in the Home Falls can cause injuries. They can happen to people of all ages. There are many things you can do to make your home safe and to help prevent falls. What can I do on the outside of my home?  Regularly fix the edges of walkways and driveways and fix any cracks.  Remove anything that might make you trip as you walk through a door, such as a raised step or threshold.  Trim any bushes or trees on the path to your home.  Use bright outdoor lighting.  Clear any walking paths of anything that might make someone trip, such as rocks or tools.  Regularly check to see if handrails are loose or broken. Make sure that both sides of any steps have handrails.  Any raised decks and porches should have guardrails on the edges.  Have any leaves, snow, or ice cleared regularly.  Use sand or salt on walking paths during winter.  Clean up any spills in your garage right away. This includes oil or grease spills. What can I do in the bathroom?  Use night lights.  Install grab bars by the toilet and in the tub and shower. Do not use towel bars as grab bars.  Use non-skid mats or decals in the tub or shower.  If you need to sit down in the shower, use a plastic,  non-slip stool.  Keep the floor dry. Clean up any water that spills on the floor as soon as it happens.  Remove soap buildup in the tub or shower regularly.  Attach bath mats securely with double-sided non-slip rug tape.  Do not have throw rugs and other things on the floor that can make you trip. What can I do in the bedroom?  Use night lights.  Make sure that you have a light by your bed that is easy to reach.  Do not use any sheets or blankets that are too big for your bed. They should not hang down onto the floor.  Have a firm chair that has side arms. You can use this for support while you get dressed.  Do not have throw rugs and other things on the floor that can make you trip. What can I do in the kitchen?  Clean up any spills right away.  Avoid walking on wet floors.  Keep items that you use a lot in easy-to-reach places.  If you need to reach something above you, use a strong step stool that has a grab bar.  Keep electrical cords out of  the way.  Do not use floor polish or wax that makes floors slippery. If you must use wax, use non-skid floor wax.  Do not have throw rugs and other things on the floor that can make you trip. What can I do with my stairs?  Do not leave any items on the stairs.  Make sure that there are handrails on both sides of the stairs and use them. Fix handrails that are broken or loose. Make sure that handrails are as long as the stairways.  Check any carpeting to make sure that it is firmly attached to the stairs. Fix any carpet that is loose or worn.  Avoid having throw rugs at the top or bottom of the stairs. If you do have throw rugs, attach them to the floor with carpet tape.  Make sure that you have a light switch at the top of the stairs and the bottom of the stairs. If you do not have them, ask someone to add them for you. What else can I do to help prevent falls?  Wear shoes that:  Do not have high heels.  Have rubber  bottoms.  Are comfortable and fit you well.  Are closed at the toe. Do not wear sandals.  If you use a stepladder:  Make sure that it is fully opened. Do not climb a closed stepladder.  Make sure that both sides of the stepladder are locked into place.  Ask someone to hold it for you, if possible.  Clearly mark and make sure that you can see:  Any grab bars or handrails.  First and last steps.  Where the edge of each step is.  Use tools that help you move around (mobility aids) if they are needed. These include:  Canes.  Walkers.  Scooters.  Crutches.  Turn on the lights when you go into a dark area. Replace any light bulbs as soon as they burn out.  Set up your furniture so you have a clear path. Avoid moving your furniture around.  If any of your floors are uneven, fix them.  If there are any pets around you, be aware of where they are.  Review your medicines with your doctor. Some medicines can make you feel dizzy. This can increase your chance of falling. Ask your doctor what other things that you can do to help prevent falls. This information is not intended to replace advice given to you by your health care provider. Make sure you discuss any questions you have with your health care provider. Document Released: 02/21/2009 Document Revised: 10/03/2015 Document Reviewed: 06/01/2014 Elsevier Interactive Patient Education  2017 [employer].

## 2019-10-10 NOTE — Patient Instructions (Signed)
Take Mucinex DM.

## 2019-10-11 ENCOUNTER — Telehealth: Payer: Self-pay

## 2019-10-11 DIAGNOSIS — E7800 Pure hypercholesterolemia, unspecified: Secondary | ICD-10-CM

## 2019-10-11 LAB — COMPREHENSIVE METABOLIC PANEL
ALT: 11 IU/L (ref 0–32)
AST: 20 IU/L (ref 0–40)
Albumin/Globulin Ratio: 2 (ref 1.2–2.2)
Albumin: 4 g/dL (ref 3.7–4.7)
Alkaline Phosphatase: 43 IU/L — ABNORMAL LOW (ref 48–121)
BUN/Creatinine Ratio: 23 (ref 12–28)
BUN: 15 mg/dL (ref 8–27)
Bilirubin Total: <0.2 mg/dL (ref 0.0–1.2)
CO2: 24 mmol/L (ref 20–29)
Calcium: 8.5 mg/dL — ABNORMAL LOW (ref 8.7–10.3)
Chloride: 104 mmol/L (ref 96–106)
Creatinine, Ser: 0.64 mg/dL (ref 0.57–1.00)
GFR calc Af Amer: 102 mL/min/{1.73_m2} (ref >59)
GFR calc non Af Amer: 89 mL/min/{1.73_m2} (ref >59)
Globulin, Total: 2 g/dL (ref 1.5–4.5)
Glucose: 83 mg/dL (ref 65–99)
Potassium: 4.3 mmol/L (ref 3.5–5.2)
Sodium: 140 mmol/L (ref 134–144)
Total Protein: 6 g/dL (ref 6.0–8.5)

## 2019-10-11 LAB — CBC WITH DIFFERENTIAL/PLATELET
Basophils Absolute: 0 10*3/uL (ref 0.0–0.2)
Basos: 0 %
EOS (ABSOLUTE): 0.1 10*3/uL (ref 0.0–0.4)
Eos: 3 %
Hematocrit: 40.4 % (ref 34.0–46.6)
Hemoglobin: 12.5 g/dL (ref 11.1–15.9)
Immature Grans (Abs): 0 10*3/uL (ref 0.0–0.1)
Immature Granulocytes: 0 %
Lymphocytes Absolute: 1.5 10*3/uL (ref 0.7–3.1)
Lymphs: 33 %
MCH: 30 pg (ref 26.6–33.0)
MCHC: 30.9 g/dL — ABNORMAL LOW (ref 31.5–35.7)
MCV: 97 fL (ref 79–97)
Monocytes Absolute: 0.4 10*3/uL (ref 0.1–0.9)
Monocytes: 8 %
Neutrophils Absolute: 2.6 10*3/uL (ref 1.4–7.0)
Neutrophils: 56 %
Platelets: 277 10*3/uL (ref 150–450)
RBC: 4.16 x10E6/uL (ref 3.77–5.28)
RDW: 13 % (ref 11.7–15.4)
WBC: 4.6 10*3/uL (ref 3.4–10.8)

## 2019-10-11 LAB — LIPID PANEL
Chol/HDL Ratio: 3.1 ratio (ref 0.0–4.4)
Cholesterol, Total: 190 mg/dL (ref 100–199)
HDL: 62 mg/dL (ref >39)
LDL Chol Calc (NIH): 114 mg/dL — ABNORMAL HIGH (ref 0–99)
Triglycerides: 79 mg/dL (ref 0–149)
VLDL Cholesterol Cal: 14 mg/dL (ref 5–40)

## 2019-10-11 LAB — MAGNESIUM: Magnesium: 2.1 mg/dL (ref 1.6–2.3)

## 2019-10-11 LAB — TSH: TSH: 3.45 u[IU]/mL (ref 0.450–4.500)

## 2019-10-11 MED ORDER — ATORVASTATIN CALCIUM 20 MG PO TABS: 20.0000 mg | ORAL_TABLET | Freq: Every day | ORAL | 3 refills | Status: DC

## 2019-10-11 NOTE — Telephone Encounter (Signed)
-----   Message from Trinna Post, [place] sent at 10/11/2019  8:55 AM EDT ----- Thyroid levels have come back into normal range. Remaining labs normal. Cholesterol just a touch high, can consider switching simvastatin 40 mg to lipitor 20 mg for better control. If agreeable, can send in Lipitor 20 QHS #90 with 3 refills. We can also refill thyroid medication one pill daily #90 with 3 refills.

## 2019-10-11 NOTE — Telephone Encounter (Signed)
Patient was advised and agreed with switching Simvastatin to Lipitor. Medication was send into pharmacy.

## 2019-10-18 ENCOUNTER — Other Ambulatory Visit: Payer: Self-pay | Admitting: Physician Assistant

## 2019-10-18 DIAGNOSIS — M810 Age-related osteoporosis without current pathological fracture: Secondary | ICD-10-CM

## 2019-10-18 MED ORDER — ALENDRONATE SODIUM 70 MG PO TABS: 70.0000 mg | ORAL_TABLET | ORAL | 1 refills | Status: DC

## 2019-10-18 NOTE — Telephone Encounter (Signed)
L.O.V. was on 10/10/2019, please advised.

## 2019-10-18 NOTE — Telephone Encounter (Signed)
Hailey Cain faxed refill request for the following medications:  alendronate (FOSAMAX) 70 MG tablet Pt is out today.  Needs it filled asap. She takes this once a week on Wednesday's    Please advise.  Thanks, [employer]

## 2019-10-20 ENCOUNTER — Encounter (INDEPENDENT_AMBULATORY_CARE_PROVIDER_SITE_OTHER): Payer: Medicare HMO | Admitting: Ophthalmology

## 2019-10-23 DIAGNOSIS — Z008 Encounter for other general examination: Secondary | ICD-10-CM | POA: Diagnosis not present

## 2019-10-23 DIAGNOSIS — E785 Hyperlipidemia, unspecified: Secondary | ICD-10-CM | POA: Diagnosis not present

## 2019-10-23 DIAGNOSIS — K219 Gastro-esophageal reflux disease without esophagitis: Secondary | ICD-10-CM | POA: Diagnosis not present

## 2019-10-23 DIAGNOSIS — R030 Elevated blood-pressure reading, without diagnosis of hypertension: Secondary | ICD-10-CM | POA: Diagnosis not present

## 2019-10-23 DIAGNOSIS — Z823 Family history of stroke: Secondary | ICD-10-CM | POA: Diagnosis not present

## 2019-10-23 DIAGNOSIS — M810 Age-related osteoporosis without current pathological fracture: Secondary | ICD-10-CM | POA: Diagnosis not present

## 2019-10-23 DIAGNOSIS — E039 Hypothyroidism, unspecified: Secondary | ICD-10-CM | POA: Diagnosis not present

## 2019-10-23 DIAGNOSIS — J309 Allergic rhinitis, unspecified: Secondary | ICD-10-CM | POA: Diagnosis not present

## 2019-10-23 DIAGNOSIS — Z7983 Long term (current) use of bisphosphonates: Secondary | ICD-10-CM | POA: Diagnosis not present

## 2019-10-23 DIAGNOSIS — K5900 Constipation, unspecified: Secondary | ICD-10-CM | POA: Diagnosis not present

## 2019-10-23 DIAGNOSIS — R69 Illness, unspecified: Secondary | ICD-10-CM | POA: Diagnosis not present

## 2019-11-02 DIAGNOSIS — H57813 Brow ptosis, bilateral: Secondary | ICD-10-CM | POA: Diagnosis not present

## 2019-11-02 DIAGNOSIS — H53482 Generalized contraction of visual field, left eye: Secondary | ICD-10-CM | POA: Diagnosis not present

## 2019-11-02 DIAGNOSIS — H02413 Mechanical ptosis of bilateral eyelids: Secondary | ICD-10-CM | POA: Diagnosis not present

## 2019-11-02 DIAGNOSIS — H02834 Dermatochalasis of left upper eyelid: Secondary | ICD-10-CM | POA: Diagnosis not present

## 2019-11-02 DIAGNOSIS — H0279 Other degenerative disorders of eyelid and periocular area: Secondary | ICD-10-CM | POA: Diagnosis not present

## 2019-11-02 DIAGNOSIS — H02423 Myogenic ptosis of bilateral eyelids: Secondary | ICD-10-CM | POA: Diagnosis not present

## 2019-11-02 DIAGNOSIS — H02831 Dermatochalasis of right upper eyelid: Secondary | ICD-10-CM | POA: Diagnosis not present

## 2019-11-06 DIAGNOSIS — H53483 Generalized contraction of visual field, bilateral: Secondary | ICD-10-CM | POA: Diagnosis not present

## 2019-12-12 DIAGNOSIS — H02831 Dermatochalasis of right upper eyelid: Secondary | ICD-10-CM | POA: Diagnosis not present

## 2019-12-12 DIAGNOSIS — H02834 Dermatochalasis of left upper eyelid: Secondary | ICD-10-CM | POA: Diagnosis not present

## 2019-12-12 DIAGNOSIS — H02423 Myogenic ptosis of bilateral eyelids: Secondary | ICD-10-CM | POA: Diagnosis not present

## 2019-12-12 DIAGNOSIS — H57813 Brow ptosis, bilateral: Secondary | ICD-10-CM | POA: Diagnosis not present

## 2019-12-12 DIAGNOSIS — H53482 Generalized contraction of visual field, left eye: Secondary | ICD-10-CM | POA: Diagnosis not present

## 2019-12-12 DIAGNOSIS — H02413 Mechanical ptosis of bilateral eyelids: Secondary | ICD-10-CM | POA: Diagnosis not present

## 2019-12-12 DIAGNOSIS — H0279 Other degenerative disorders of eyelid and periocular area: Secondary | ICD-10-CM | POA: Diagnosis not present

## 2020-01-02 ENCOUNTER — Encounter (INDEPENDENT_AMBULATORY_CARE_PROVIDER_SITE_OTHER): Payer: Medicare HMO | Admitting: Ophthalmology

## 2020-01-02 ENCOUNTER — Encounter (INDEPENDENT_AMBULATORY_CARE_PROVIDER_SITE_OTHER): Payer: Self-pay

## 2020-01-02 DIAGNOSIS — Z961 Presence of intraocular lens: Secondary | ICD-10-CM

## 2020-01-02 DIAGNOSIS — H3581 Retinal edema: Secondary | ICD-10-CM

## 2020-01-02 DIAGNOSIS — H26493 Other secondary cataract, bilateral: Secondary | ICD-10-CM

## 2020-01-02 DIAGNOSIS — H35373 Puckering of macula, bilateral: Secondary | ICD-10-CM

## 2020-01-02 DIAGNOSIS — H02402 Unspecified ptosis of left eyelid: Secondary | ICD-10-CM

## 2020-01-25 ENCOUNTER — Other Ambulatory Visit: Payer: Self-pay | Admitting: Physician Assistant

## 2020-01-25 DIAGNOSIS — F32A Depression, unspecified: Secondary | ICD-10-CM

## 2020-01-25 MED ORDER — FLUOXETINE HCL 20 MG PO CAPS: ORAL_CAPSULE | ORAL | 1 refills | Status: DC

## 2020-01-25 NOTE — Telephone Encounter (Signed)
Medication Refill - Medication: Generic Prozac 20 bid  Has the patient contacted their pharmacy? No. (Agent: If no, request that the patient contact the pharmacy for the refill.) (Agent: If yes, when and what did the pharmacy advise?)  Preferred Pharmacy (with phone number or street name): [address]  Agent: Please be advised that RX refills may take up to 3 business days. We ask that you follow-up with your pharmacy.

## 2020-01-25 NOTE — Telephone Encounter (Signed)
Approved per protocol.  

## 2020-01-26 NOTE — Progress Notes (Signed)
[facility] Note  01/31/2020     CHIEF COMPLAINT Patient presents for Retina Follow Up   HISTORY OF PRESENT ILLNESS: Hailey Cain is a 74 y.o. female who presents to the clinic today for:   HPI    Retina Follow Up    Patient presents with  Other.  In both eyes.  I, the attending physician,  performed the HPI with the patient and updated documentation appropriately.          Comments    5 month follow up ERM OU- Doing well.  For about a week OS has been dilated.  Denies any new medications or make up, anything different.  Vision stable OU. She had lid sx with Dr. Sabino Dick 12/12/19 LUL.  She had been using a OTC gel and ATs during the day when her eye feels dry.        Last edited by Bernarda Caffey, MD on 01/31/2020  9:18 AM. (History)    pt states she had eyelid sx with Dr. Sabino Dick in August, she states her vision has improved since then, her eyes are very dry now, so she is using AT's during the day and gel at night  Referring physician: Anell Barr, [place],  [place] [address]  HISTORICAL INFORMATION:   Selected notes from the [facility] Referred by Dr. Orion Modest for ERM   CURRENT MEDICATIONS: Current Outpatient Medications (Ophthalmic Drugs)  Medication Sig  . Polyethyl Glycol-Propyl Glycol (LUBRICANT EYE DROPS) 0.4-0.3 % SOLN Place 1 drop into both eyes 2 (two) times daily.  . bacitracin-polymyxin b (POLYSPORIN) ophthalmic ointment 1 application at bedtime as needed. At bedtime and as needed throughout the day (Patient not taking: Reported on 01/31/2020)  . prednisoLONE acetate (PRED FORTE) 1 % ophthalmic suspension Place 1 drop into the left eye 4 (four) times daily. (Patient not taking: Reported on 10/10/2019)   No current facility-administered medications for this visit. (Ophthalmic Drugs)   Current Outpatient Medications (Other)  Medication Sig  . alendronate (FOSAMAX) 70 MG tablet Take 1 tablet  (70 mg total) by mouth every Wednesday. Take with a full glass of water on an empty stomach.  Marland Kitchen aspirin EC 325 MG tablet Take 325 mg by mouth daily.  Marland Kitchen atorvastatin (LIPITOR) 20 MG tablet Take 1 tablet (20 mg total) by mouth at bedtime.  . Calcium Carbonate (CALCIUM 600 PO) Take 600 mg by mouth 2 (two) times a week.   . Cholecalciferol (VITAMIN D-3) 25 MCG (1000 UT) CAPS Take 1,000 Units by mouth daily.  Marland Kitchen FLUoxetine (PROZAC) 20 MG capsule TAKE 2 CAPSULES (40 MG TOTAL) BY MOUTH ONCE DAILY  . levothyroxine (SYNTHROID) 88 MCG tablet TAKE 1 TABLET BY MOUTH ONCE DAILY BEFORE BREAKFAST  . loratadine (CLARITIN) 10 MG tablet Take 1 tablet (10 mg total) by mouth daily. Reported on 05/17/2015 (Patient taking differently: Take 10 mg by mouth daily as needed for allergies. Reported on 05/17/2015)  . Multiple Vitamins-Minerals (EMERGEN-C IMMUNE PO) Take 1 tablet by mouth daily as needed (immunity boost).   . vitamin B-12 (CYANOCOBALAMIN) 500 MCG tablet Take 500 mcg by mouth daily.  Marland Kitchen omeprazole (PRILOSEC) 20 MG capsule Take 1 capsule (20 mg total) by mouth daily.   No current facility-administered medications for this visit. (Other)      REVIEW OF SYSTEMS: ROS    Positive for: Gastrointestinal, Musculoskeletal, Eyes, Respiratory   Negative for: Constitutional, Neurological, Skin, Genitourinary, HENT, Endocrine, Cardiovascular,  Psychiatric, Allergic/Imm, Heme/Lymph   Last edited by Leonie Douglas, COA on 01/31/2020  8:52 AM. (History)       ALLERGIES No Known Allergies  PAST MEDICAL HISTORY Past Medical History:  Diagnosis Date  . Acid reflux 12/30/2006  . Adult hypothyroidism 01/13/2007  . Allergic rhinitis 09/07/2014  . Anxiety 12/10/2014  . Bronchitis 05/17/2015  . Cancer of cheek ([place]) 07/04/2007  . Depression, neurotic 12/30/2006  . Hyperlipidemia   . Prediabetes 08/11/2016  . RAD (reactive airway disease) 09/07/2014  . Small bowel obstruction ([place]) 01/31/2017   Past Surgical History:  Procedure  Laterality Date  . ABDOMINAL HYSTERECTOMY  1988   heavy, irregular menses; ovaries intact  . AUGMENTATION MAMMAPLASTY Bilateral    silicone  . BREAST SURGERY  1970   augmentation  . CATARACT EXTRACTION Bilateral 2013   Dr. Talbert Forest.  [place].  OD-Distance.  Marland Kitchen EYE SURGERY     cataract extraction  . EYE SURGERY  2007   LASIK  . MEMBRANE PEEL Left 05/25/2019   Procedure: MEMBRANE PEEL;  Surgeon: Bernarda Caffey, MD;  Location: [facility];  Service: Ophthalmology;  Laterality: Left;  . PARS PLANA VITRECTOMY Left 05/25/2019   Procedure: PARS PLANA VITRECTOMY WITH 25 GAUGE;  Surgeon: Bernarda Caffey, MD;  Location: [facility];  Service: Ophthalmology;  Laterality: Left;  . TONSILLECTOMY AND ADENOIDECTOMY  1950  . TUBAL LIGATION    . YAG LASER APPLICATION Left 44/96/7591   Dr. Bernarda Caffey    FAMILY HISTORY Family History  Problem Relation Age of Onset  . Stroke Mother   . CAD Mother   . Heart attack Father   . Hyperthyroidism Sister   . Heart Problems Brother   . Cancer Brother        skin  . CAD Brother        CABG    SOCIAL HISTORY Social History   Tobacco Use  . Smoking status: Former Smoker    Packs/day: 1.00    Years: 20.00    Pack years: 20.00    Quit date: 05/12/2003    Years since quitting: 16.7  . Smokeless tobacco: Never Used  Vaping Use  . Vaping Use: Never used  Substance Use Topics  . Alcohol use: No    Alcohol/week: 0.0 standard drinks  . Drug use: No         OPHTHALMIC EXAM:  Base Eye Exam    Visual Acuity (Snellen - Linear)      Right Left   Dist [place] 20/20 20/60 +2   Dist ph [place]  NI       Tonometry (Tonopen, 8:58 AM)      Right Left   Pressure 14 15       Pupils      Dark Light Shape React APD   Right 4 3 Round Brisk None   Left 5 4 Round Minimal None       Visual Fields (Counting fingers)      Left Right    Full Full       Extraocular Movement      Right Left    Full Full       Neuro/Psych    Oriented x3: Yes   Mood/Affect: Normal        Dilation    Both eyes: 1.0% Mydriacyl, 2.5% Phenylephrine @ 8:58 AM        Slit Lamp and Fundus Exam    Slit Lamp Exam      Right Left  Lids/Lashes Dermatochalasis - upper lid, mild Meibomian gland dysfunction Dermatochalasis - upper lid, mild Ptosis   Conjunctiva/Sclera Trace nasal Pinguecula white and quiet   Cornea Arcus, 2-3+ Punctate epithelial erosions Trace PEE, 1+ Punctate epithelial erosions   Anterior Chamber Deep and quiet deep and clear   Iris Round and dilated Dilated, mildly vertical ovoid    Lens PC IOL in good position, trace, non-central Posterior capsular opacification PC IOL in good position with open PC   Vitreous Vitreous syneresis, Posterior vitreous detachment post-vitrectomy       Fundus Exam      Right Left   Disc Pink and Sharp Mild pallor, sharp rim, temporal PPA   C/D Ratio 0.2 0.4   Macula Flat, Good foveal reflex, parafoveal Drusen, mild Retinal pigment epithelial mottling flat, ERM and PRF gone, Blunted foveal reflex, Retinal pigment epithelial mottling, central thickening stable   Vessels Vascular attenuation, mild Tortuousity attenuation, mild tortuousity, AV crossing changes   Periphery Attached, No heme  attached, good peripheral laser changes 360          IMAGING AND PROCEDURES  Imaging and Procedures for @TODAY @  OCT, Retina - OU - Both Eyes       Right Eye Quality was good. Central Foveal Thickness: 274. Progression has been stable. Findings include abnormal foveal contour, no IRF, retinal drusen , no SRF, epiretinal membrane (Mild ERM greatest nasally).   Left Eye Quality was good. Central Foveal Thickness: 373. Progression has been stable. Findings include abnormal foveal contour, no IRF, no SRF, retinal drusen  (ERM gone; ellipsoid signal improving, irregular inner retinal surface).   Notes *Images captured and stored on drive  Diagnosis / Impression:  Non-exu ARMD OU OD: Mild nasal ERM OD -- stable OS: ERM and PRF gone;  ellipsoid signal improving, irregular inner retinal surface -- stable from prior  Clinical management:  See below  Abbreviations: NFP - Normal foveal profile. CME - cystoid macular edema. PED - pigment epithelial detachment. IRF - intraretinal fluid. SRF - subretinal fluid. EZ - ellipsoid zone. ERM - epiretinal membrane. ORA - outer retinal atrophy. ORT - outer retinal tubulation. SRHM - subretinal hyper-reflective material                 ASSESSMENT/PLAN:    ICD-10-CM   1. Epiretinal membrane (ERM) of both eyes  H35.373   2. Retinal edema  H35.81 OCT, Retina - OU - Both Eyes  3. Pseudophakia of both eyes  Z96.1   4. Bilateral posterior capsular opacification  H26.493   5. Ptosis of eyelid, left  H02.402   6. Dry eyes  H04.123     1,2. Epiretinal membrane, OU  - OD: mild ERM, greatest nasally  - OS s/p 25g PPV/TissueBlue stain/MP/20% SF6, 01.14.21             - doing well -- ERM/PRF gone and ellipsoid signal improving  - BCVA stable at 20/60 +2             - IOP okay at 14 mmHg  - cont AT's  - f/u 9-12 months -- DFE/OCT  3. Pseudophakia OU  - s/p CE/IOL OU (Dr Talbert Forest, 2013) -- monovision with OS being near eye  - beautiful surgeries, doing well  - monitor  4. PCO OU  - OD: trace, non-central  - OS s/p YAG cap 11.13.20 - nice opening in place  - stable, monitor  5. Ptosis - LUL  - post PPV as above  - under the  expert management of Dr. Sabino Dick at [facility]  - s/p repair August 2021  6. Dry eyes OU  - recommend artificial tears and lubricating ointment as needed    Ophthalmic Meds Ordered this visit:  No orders of the defined types were placed in this encounter.      Return for f/u 9-12 months ERM OU, DFE, OCT.  There are no Patient Instructions on file for this visit.   Explained the diagnoses, plan, and follow up with the patient and they expressed understanding.  Patient expressed understanding of the importance of proper follow up care.    This document serves as a record of services personally performed by Gardiner Sleeper, MD, PhD. It was created on their behalf by Roselee Nova, COMT. The creation of this record is the provider's dictation and/or activities during the visit.  Electronically signed by: Roselee Nova, COMT 01/31/20 12:55 PM   This document serves as a record of services personally performed by Gardiner Sleeper, MD, PhD. It was created on their behalf by San Jetty. Owens Shark, OA an ophthalmic technician. The creation of this record is the provider's dictation and/or activities during the visit.    Electronically signed by: San Jetty. Marguerita Merles 09.22.2021 12:55 PM  Gardiner Sleeper, M.D., Ph.D. Diseases & Surgery of the Retina and Vitreous [facility]  I have reviewed the above documentation for accuracy and completeness, and I agree with the above. Gardiner Sleeper, M.D., Ph.D. 01/31/20 12:55 PM   Abbreviations: M myopia (nearsighted); A astigmatism; H hyperopia (farsighted); P presbyopia; Mrx spectacle prescription;  CTL contact lenses; OD right eye; OS left eye; OU both eyes  XT exotropia; ET esotropia; PEK punctate epithelial keratitis; PEE punctate epithelial erosions; DES dry eye syndrome; MGD meibomian gland dysfunction; ATs artificial tears; PFAT's preservative free artificial tears; [place] nuclear sclerotic cataract; PSC posterior subcapsular cataract; ERM epi-retinal membrane; PVD posterior vitreous detachment; RD retinal detachment; DM diabetes mellitus; DR diabetic retinopathy; NPDR non-proliferative diabetic retinopathy; PDR proliferative diabetic retinopathy; CSME clinically significant macular edema; DME diabetic macular edema; dbh dot blot hemorrhages; CWS cotton wool spot; POAG primary open angle glaucoma; C/D cup-to-disc ratio; HVF humphrey visual field; GVF goldmann visual field; OCT optical coherence tomography; IOP intraocular pressure; BRVO Branch retinal vein occlusion; CRVO central  retinal vein occlusion; CRAO central retinal artery occlusion; BRAO branch retinal artery occlusion; RT retinal tear; SB scleral buckle; PPV pars plana vitrectomy; VH Vitreous hemorrhage; PRP panretinal laser photocoagulation; IVK intravitreal kenalog; VMT vitreomacular traction; MH Macular hole;  NVD neovascularization of the disc; NVE neovascularization elsewhere; AREDS age related eye disease study; ARMD age related macular degeneration; POAG primary open angle glaucoma; EBMD epithelial/anterior basement membrane dystrophy; ACIOL anterior chamber intraocular lens; IOL intraocular lens; PCIOL posterior chamber intraocular lens; Phaco/IOL phacoemulsification with intraocular lens placement; [place] photorefractive keratectomy; LASIK laser assisted in situ keratomileusis; HTN hypertension; DM diabetes mellitus; COPD chronic obstructive pulmonary disease

## 2020-01-31 ENCOUNTER — Ambulatory Visit (INDEPENDENT_AMBULATORY_CARE_PROVIDER_SITE_OTHER): Payer: Medicare HMO | Admitting: Ophthalmology

## 2020-01-31 ENCOUNTER — Other Ambulatory Visit: Payer: Self-pay

## 2020-01-31 ENCOUNTER — Encounter (INDEPENDENT_AMBULATORY_CARE_PROVIDER_SITE_OTHER): Payer: Self-pay | Admitting: Ophthalmology

## 2020-01-31 DIAGNOSIS — H04123 Dry eye syndrome of bilateral lacrimal glands: Secondary | ICD-10-CM

## 2020-01-31 DIAGNOSIS — H02402 Unspecified ptosis of left eyelid: Secondary | ICD-10-CM | POA: Diagnosis not present

## 2020-01-31 DIAGNOSIS — H26493 Other secondary cataract, bilateral: Secondary | ICD-10-CM | POA: Diagnosis not present

## 2020-01-31 DIAGNOSIS — H3581 Retinal edema: Secondary | ICD-10-CM

## 2020-01-31 DIAGNOSIS — Z961 Presence of intraocular lens: Secondary | ICD-10-CM

## 2020-01-31 DIAGNOSIS — H35373 Puckering of macula, bilateral: Secondary | ICD-10-CM | POA: Diagnosis not present

## 2020-02-12 DIAGNOSIS — R69 Illness, unspecified: Secondary | ICD-10-CM | POA: Diagnosis not present

## 2020-03-19 ENCOUNTER — Other Ambulatory Visit: Payer: Self-pay | Admitting: Physician Assistant

## 2020-03-19 DIAGNOSIS — E039 Hypothyroidism, unspecified: Secondary | ICD-10-CM

## 2020-04-08 ENCOUNTER — Other Ambulatory Visit: Payer: Self-pay | Admitting: Physician Assistant

## 2020-04-08 DIAGNOSIS — M810 Age-related osteoporosis without current pathological fracture: Secondary | ICD-10-CM

## 2020-04-08 NOTE — Telephone Encounter (Signed)
L.O.V. was on 01/31/20 and next appointment is on 10/15/2020. Is this medication okay for refill?

## 2020-04-08 NOTE — Telephone Encounter (Signed)
Requested medication (s) are due for refill today: yes  Requested medication (s) are on the active medication list: yes  Last refill:  10/18/19  Future visit scheduled: yes  Notes to clinic:  overdue lab work   Requested Prescriptions  Pending Prescriptions Disp Refills   alendronate (FOSAMAX) 70 MG tablet 12 tablet 1    Sig: Take 1 tablet (70 mg total) by mouth every Wednesday. Take with a full glass of water on an empty stomach.      Endocrinology:  Bisphosphonates Failed - 04/08/2020  9:06 AM      Failed - Ca in normal range and within 360 days    Calcium  Date Value Ref Range Status  10/10/2019 8.5 (L) 8.7 - 10.3 mg/dL Final          Failed - Vitamin D in normal range and within 360 days    No results found for: [id number], [id number], [id number], 25OHVITD3, 25OHVITD2, 25OHVITD3, 25OHVITD2, 25OHVITD1, 25OHVITD2, 25OHVITD3, VD25OH        Passed - Valid encounter within last 12 months    Recent Outpatient Visits           6 months ago Annual physical exam   [facility], [place], [place]   10 months ago Pre-operative clearance   [facility] Trinna Post, [place]   1 year ago Adult hypothyroidism   [facility], Wendee Beavers, [place]   1 year ago Viral URI with cough   [facility] Carles Collet M, [place]   2 years ago Prediabetes   [facility], Wendee Beavers, [place]       Future Appointments             In 6 months Terrilee Croak, Wendee Beavers, [place], [place]

## 2020-04-08 NOTE — Telephone Encounter (Signed)
PT need a refill  alendronate (FOSAMAX) 70 MG tablet [[id number]]  [facility] (N), [place] - [address]  [place]) [place] [address]  Phone: [phone] Fax: [phone]

## 2020-04-09 MED ORDER — ALENDRONATE SODIUM 70 MG PO TABS: 70.0000 mg | ORAL_TABLET | ORAL | 1 refills | Status: DC

## 2020-05-16 ENCOUNTER — Other Ambulatory Visit: Payer: Self-pay | Admitting: Physician Assistant

## 2020-05-16 MED ORDER — OMEPRAZOLE 20 MG PO CPDR: 20.0000 mg | DELAYED_RELEASE_CAPSULE | Freq: Every day | ORAL | 1 refills | Status: DC

## 2020-05-16 NOTE — Addendum Note (Signed)
Addended by: Lisabeth Pick on: 05/16/2020 10:03 AM   Modules accepted: Orders

## 2020-05-16 NOTE — Telephone Encounter (Signed)
Medication Refill - Medication: omeprazole   Has the patient contacted their pharmacy? Yes.   Pt states that she is completely out of this medication. Please advise.  (Agent: If no, request that the patient contact the pharmacy for the refill.) (Agent: If yes, when and what did the pharmacy advise?)  Preferred Pharmacy (with phone number or street name):  [facility] [address] (N), [place] - [address]  [address] ([address]) [place] [address]  Phone: [phone] Fax: [phone]  Hours: Not open 24 hours     Agent: Please be advised that RX refills may take up to 3 business days. We ask that you follow-up with your pharmacy.

## 2020-07-06 DIAGNOSIS — E039 Hypothyroidism, unspecified: Secondary | ICD-10-CM | POA: Diagnosis not present

## 2020-07-06 DIAGNOSIS — Z20822 Contact with and (suspected) exposure to covid-19: Secondary | ICD-10-CM | POA: Diagnosis not present

## 2020-07-06 DIAGNOSIS — K219 Gastro-esophageal reflux disease without esophagitis: Secondary | ICD-10-CM | POA: Diagnosis not present

## 2020-07-06 DIAGNOSIS — F419 Anxiety disorder, unspecified: Secondary | ICD-10-CM | POA: Diagnosis not present

## 2020-07-06 DIAGNOSIS — G8929 Other chronic pain: Secondary | ICD-10-CM | POA: Diagnosis not present

## 2020-07-06 DIAGNOSIS — R69 Illness, unspecified: Secondary | ICD-10-CM | POA: Diagnosis not present

## 2020-07-06 DIAGNOSIS — M810 Age-related osteoporosis without current pathological fracture: Secondary | ICD-10-CM | POA: Diagnosis not present

## 2020-07-06 DIAGNOSIS — E785 Hyperlipidemia, unspecified: Secondary | ICD-10-CM | POA: Diagnosis not present

## 2020-07-06 DIAGNOSIS — K5900 Constipation, unspecified: Secondary | ICD-10-CM | POA: Diagnosis not present

## 2020-07-06 DIAGNOSIS — G629 Polyneuropathy, unspecified: Secondary | ICD-10-CM | POA: Diagnosis not present

## 2020-07-10 IMAGING — MG DIGITAL SCREENING BREAST BILAT IMPLANT W/ TOMO W/ CAD
8 of 12 series · 8 of 26 positions shown · non-contrast
Comparison: Previous exam(s).

CLINICAL DATA: Screening.

EXAM:
DIGITAL SCREENING BILATERAL MAMMOGRAM WITH IMPLANTS, CAD AND TOMO
The patient has retroglandular implants. Standard and implant
displaced views were performed.

[R MLO]
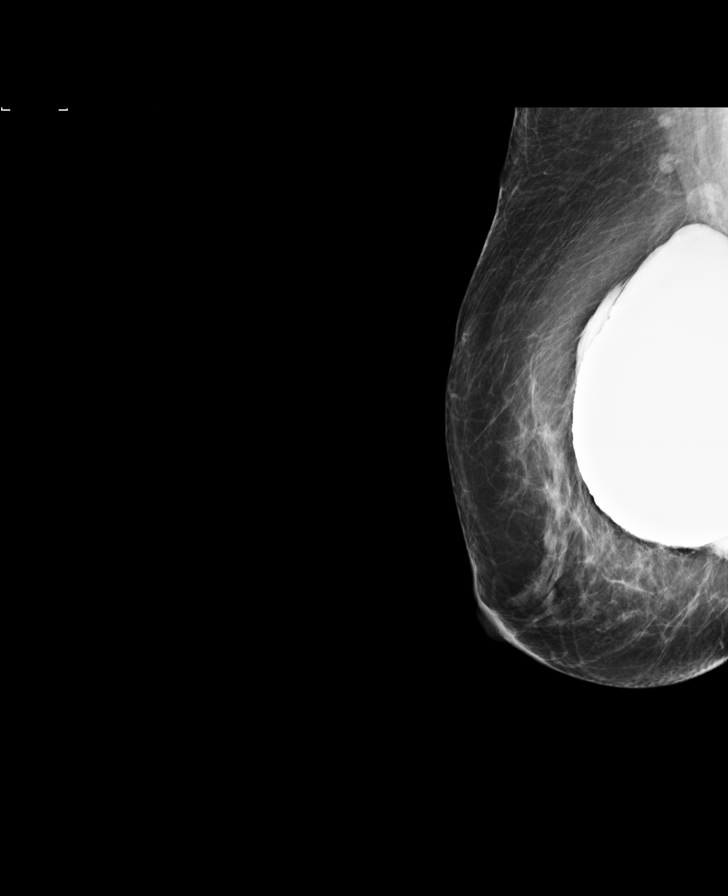

[L MLO]
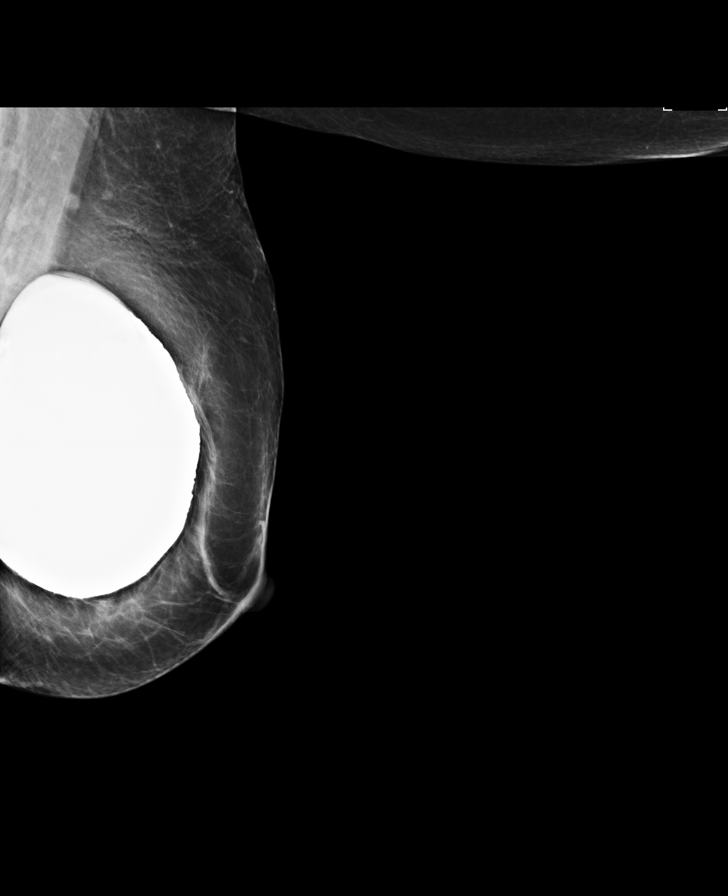

[L CC]
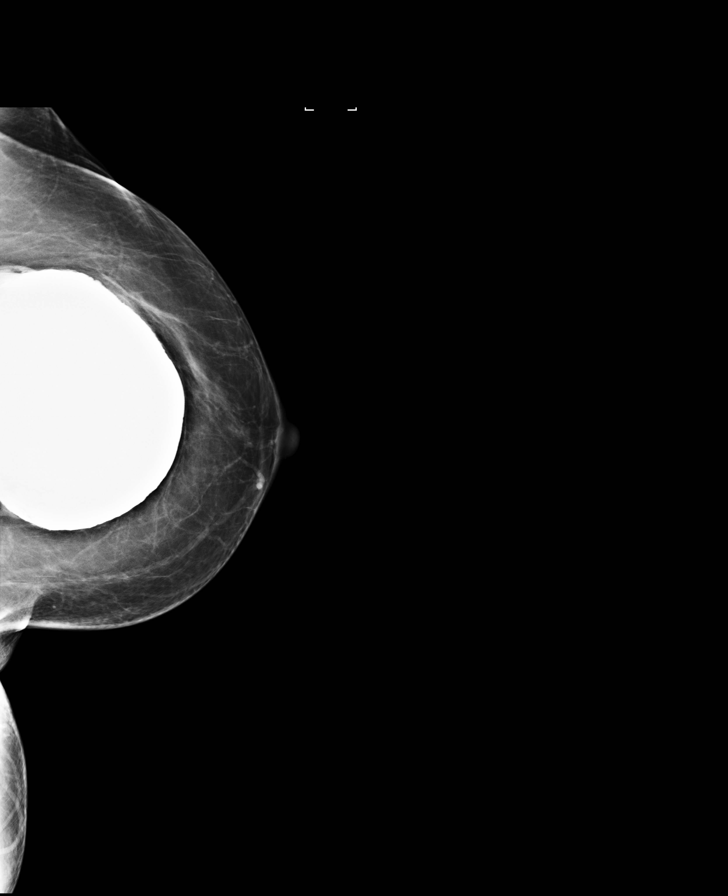

[R CC]
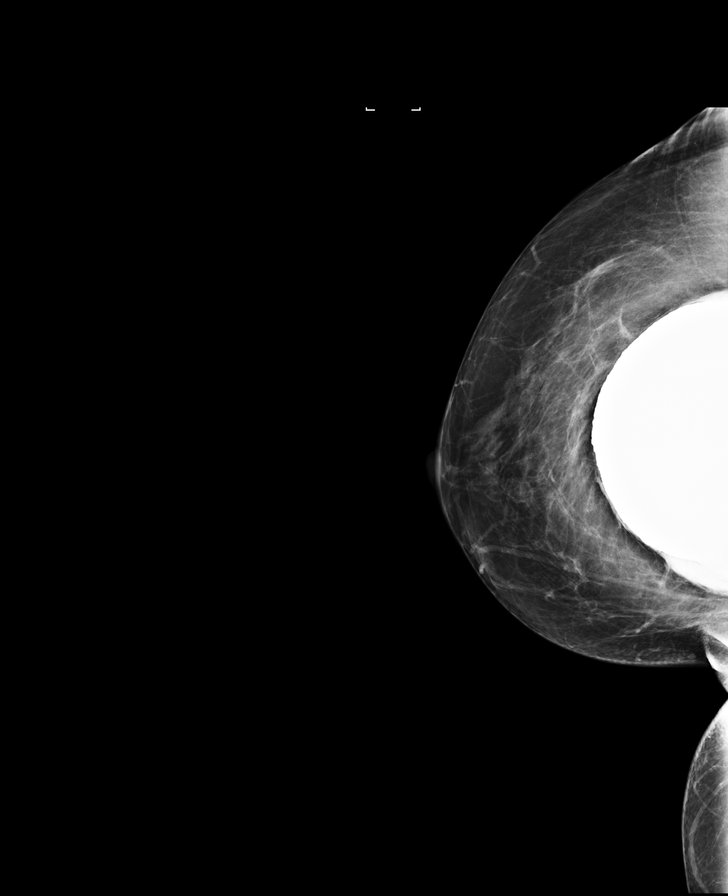

[L CC synth-2D]
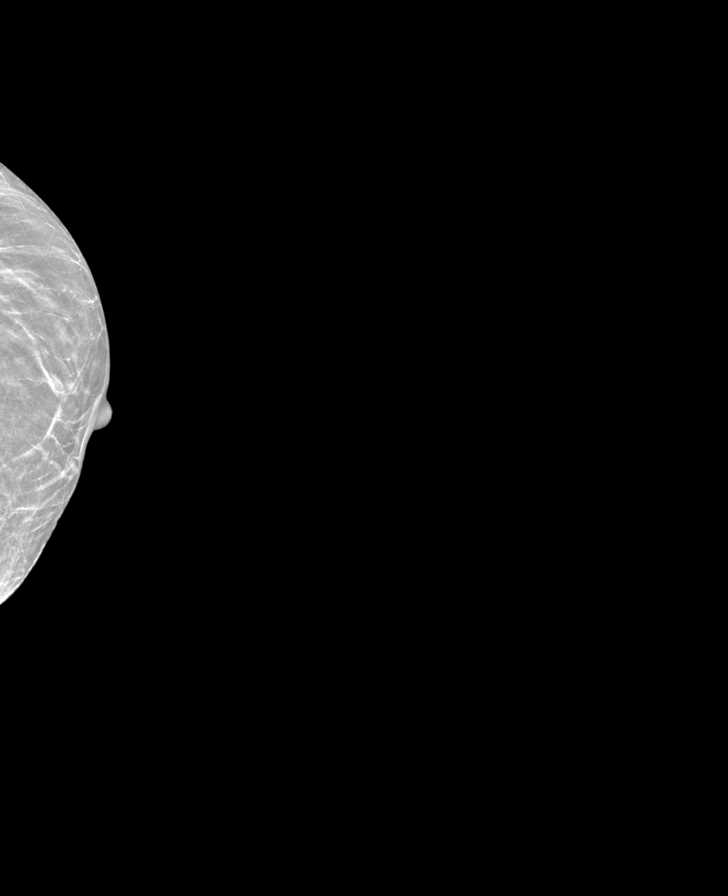

[R MLO synth-2D]
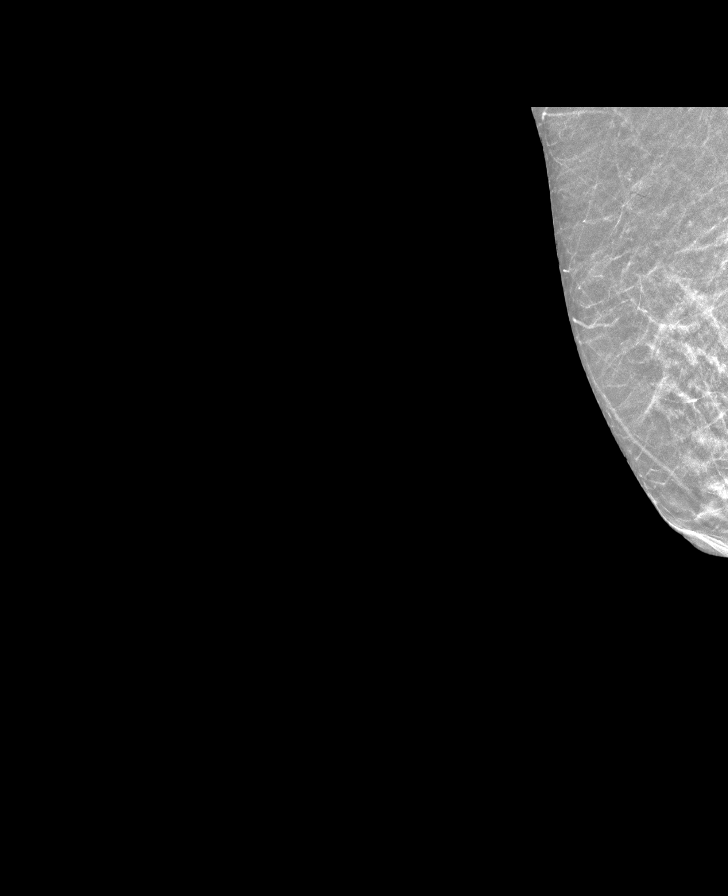

[R CC synth-2D]
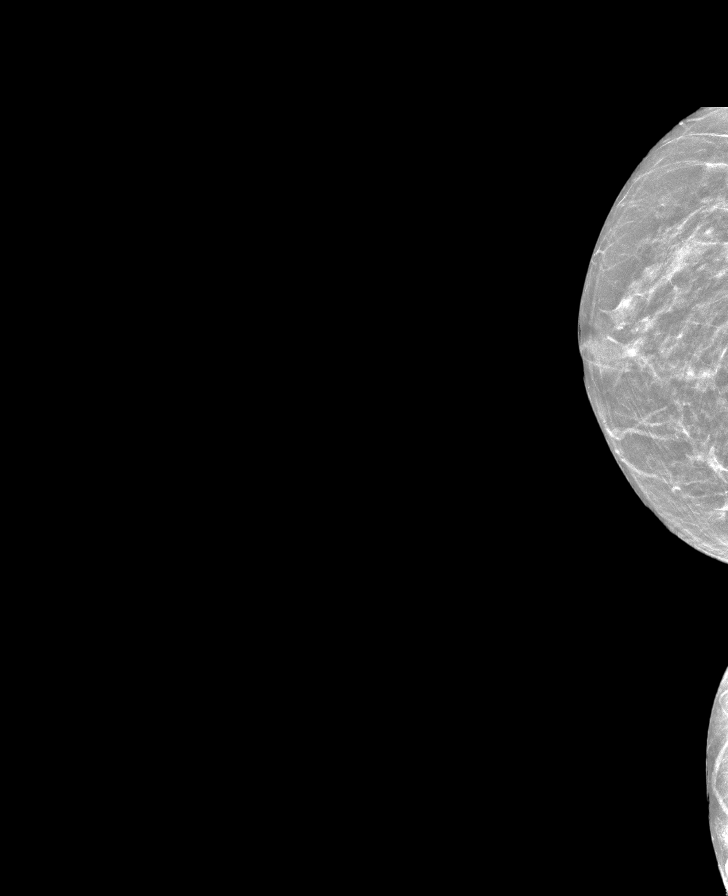

[L MLO synth-2D]
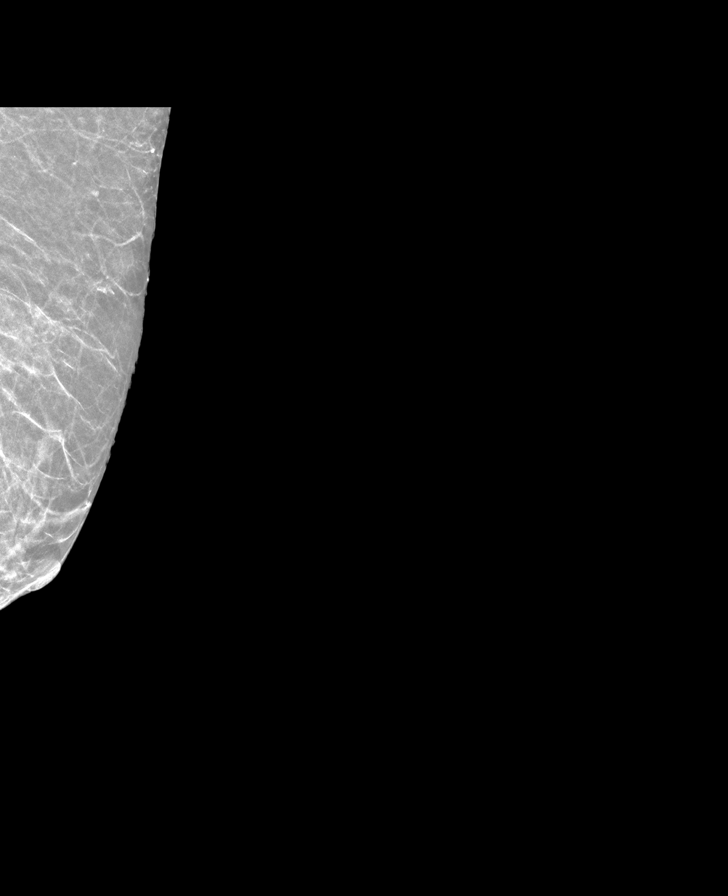

[8 of 26 positions shown; findings below may reference images not displayed]

ACR Breast Density Category b: There are scattered areas of
fibroglandular density.
FINDINGS: There are no findings suspicious for malignancy. Images were
processed with CAD.

There is extracapsular silicone present bilaterally, similar to
prior.
IMPRESSION: No mammographic evidence of malignancy. A result letter of this
screening mammogram will be mailed directly to the patient.

RECOMMENDATION:
Screening mammogram in one year. (Code:[id number])

BI-RADS CATEGORY  1:  Negative.

## 2020-08-14 ENCOUNTER — Other Ambulatory Visit: Payer: Self-pay | Admitting: Physician Assistant

## 2020-08-14 DIAGNOSIS — F32A Depression, unspecified: Secondary | ICD-10-CM

## 2020-08-14 MED ORDER — FLUOXETINE HCL 20 MG PO CAPS: ORAL_CAPSULE | ORAL | 0 refills | Status: DC

## 2020-08-14 NOTE — Telephone Encounter (Signed)
Medication Refill - Medication: FLUoxetine (PROZAC) 20 MG capsule Pt is out of medication   Has the patient contacted their pharmacy? Yes.   (Agent: If no, request that the patient contact the pharmacy for the refill.) (Agent: If yes, when and what did the pharmacy advise?)  Preferred Pharmacy (with phone number or street name): [facility] (N), [place] - [address]  [place] ([place]) [place] [address]   Agent: Please be advised that RX refills may take up to 3 business days. We ask that you follow-up with your pharmacy.

## 2020-08-14 NOTE — Telephone Encounter (Signed)
Courtesy refill. Overdue office visit . Future visit in 2 months

## 2020-08-26 ENCOUNTER — Telehealth: Payer: Self-pay

## 2020-08-26 NOTE — Telephone Encounter (Signed)
Virtual office visit if available otherwise advise E visit or Urgent Care visit today since we are down providers in office.

## 2020-08-26 NOTE — Telephone Encounter (Signed)
Scheduled virtual visit for 4/19/222

## 2020-08-26 NOTE — Telephone Encounter (Signed)
Copied from [place] [id number]. Topic: General - Other >> Aug 26, 2020  9:24 AM [name] wrote: Reason for CRM: Patient would like to be prescribed something for sinus discomfort and congestion  Patient has been experiencing the congestion for roughly 10(ten) days  Patient is also experiencing sneezing and watery eyes, but little other symptoms  Patient has declined to make an appt at the time of call with agent  Please contact to further advise if needed

## 2020-08-27 ENCOUNTER — Telehealth (INDEPENDENT_AMBULATORY_CARE_PROVIDER_SITE_OTHER): Payer: Medicare HMO | Admitting: Adult Health

## 2020-08-27 ENCOUNTER — Encounter: Payer: Self-pay | Admitting: Adult Health

## 2020-08-27 DIAGNOSIS — J302 Other seasonal allergic rhinitis: Secondary | ICD-10-CM

## 2020-08-27 DIAGNOSIS — J0140 Acute pansinusitis, unspecified: Secondary | ICD-10-CM | POA: Diagnosis not present

## 2020-08-27 MED ORDER — LORATADINE 10 MG PO TABS: 10.0000 mg | ORAL_TABLET | Freq: Every day | ORAL | 0 refills | Status: DC

## 2020-08-27 MED ORDER — AMOXICILLIN-POT CLAVULANATE 875-125 MG PO TABS: 1.0000 | ORAL_TABLET | Freq: Two times a day (BID) | ORAL | 0 refills | Status: DC

## 2020-08-27 MED ORDER — PREDNISONE 20 MG PO TABS: 20.0000 mg | ORAL_TABLET | Freq: Every day | ORAL | 0 refills | Status: AC

## 2020-08-27 NOTE — Patient Instructions (Signed)
Allergies, Adult An allergy means that your body reacts to something that bothers it (allergen). This can happen from something that you eat, breathe in, or touch. Allergies often affect the nose, eyes, skin, and stomach. They can be mild, moderate, or very bad (severe). An allergy cannot spread from person to person. They can happen at any age. Sometimes, people outgrow them. What are the causes?  Outdoor things, such as pollen, car fumes, and mold.  Indoor things, such as dust, smoke, mold, and pets.  Foods.  Medicines.  Things that bother your skin, such as perfume and bug bites. What increases the risk?  Having family members with allergies or asthma. What are the signs or symptoms? Symptoms depend on how bad your allergy is. Mild to moderate symptoms  Runny nose, stuffy nose, or sneezing.  Itchy mouth, ears, or throat.  A feeling of mucus dripping down the back of your throat.  Sore throat.  Eyes that are itchy, red, watery, or puffy.  A skin rash, or red, swollen areas of skin (hives).  Stomach cramps or bloating. Severe symptoms Very bad allergies to food, medicine, or bug bites may cause a very bad allergy reaction (anaphylaxis). This can be life-threatening. Symptoms include:  A red face.  Wheezing or coughing.  Swollen lips, tongue, or mouth.  Tight or swollen throat.  Chest pain or tightness, or a fast heartbeat.  Trouble breathing or shortness of breath.  Pain in your belly (abdomen), vomiting, or watery poop (diarrhea).  Feeling dizzy or fainting. How is this treated? Treatment for this condition depends on your symptoms. Treatment may include:  Cold, wet cloths for itching and swelling.  Eye drops, nose sprays, or skin creams.  Washing out your nose each day.  A humidifier.  Medicines.  A change to the foods you eat.  Being exposed again and again to tiny amounts of allergens. This helps your body get used to them. You might  have: ? Allergy shots. ? Very small amounts of allergen put under your tongue.  An emergency shot (auto-injector pen) if you have a very bad allergy reaction. ? This is a medicine with a needle. You can put it into your skin by yourself. ? Your doctor will teach you how to use it.      Follow these instructions at home: Medicines  Take or apply over-the-counter and prescription medicines only as told by your doctor.  If you are at risk for a very bad allergy reaction, keep an auto-injector pen with you all the time.   Eating and drinking  Follow instructions from your doctor about what to eat and drink.  Drink enough fluid to keep your pee (urine) pale yellow. General instructions  If you have ever had a very bad allergy reaction, wear a medical alert bracelet or necklace.  Stay away from things that you are allergic to.  Keep all follow-up visits as told by your doctor. This is important. Contact a doctor if:  Your symptoms do not get better with treatment. Get help right away if:  You have symptoms of a very bad allergy reaction. These include: ? A swollen mouth, tongue, or throat. ? Pain or tightness in your chest. ? Trouble breathing. ? Being short of breath. ? Dizziness. ? Fainting. ? Very bad pain in your belly. ? Vomiting. ? Watery poop. These symptoms may be an emergency. Do not wait to see if the symptoms will go away. Get medical help right away. Call your local  emergency services (911 in the [place] Do not drive yourself to the hospital. Summary  Take or apply over-the-counter and prescription medicines only as told by your doctor.  Stay away from things you are allergic to.  If you are at risk for a very bad allergy reaction, carry an auto-injector pen all the time.  Wear a medical alert bracelet or necklace.  Very bad allergy reactions can be life-threatening. Get help right away. This information is not intended to replace advice given to you by your  health care provider. Make sure you discuss any questions you have with your health care provider. Document Revised: 03/08/2019 Document Reviewed: 03/08/2019 Elsevier Patient Education  2021 [place]. Sinusitis, Adult Sinusitis is soreness and swelling (inflammation) of your sinuses. Sinuses are hollow spaces in the bones around your face. They are located:  Around your eyes.  In the middle of your forehead.  Behind your nose.  In your cheekbones. Your sinuses and nasal passages are lined with a fluid called mucus. Mucus drains out of your sinuses. Swelling can trap mucus in your sinuses. This lets germs (bacteria, virus, or fungus) grow, which leads to infection. Most of the time, this condition is caused by a virus. What are the causes? This condition is caused by:  Allergies.  Asthma.  Germs.  Things that block your nose or sinuses.  Growths in the nose (nasal polyps).  Chemicals or irritants in the air.  Fungus (rare). What increases the risk? You are more likely to develop this condition if:  You have a weak body defense system (immune system).  You do a lot of swimming or diving.  You use nasal sprays too much.  You smoke. What are the signs or symptoms? The main symptoms of this condition are pain and a feeling of pressure around the sinuses. Other symptoms include:  Stuffy nose (congestion).  Runny nose (drainage).  Swelling and warmth in the sinuses.  Headache.  Toothache.  A cough that may get worse at night.  Mucus that collects in the throat or the back of the nose (postnasal drip).  Being unable to smell and taste.  Being very tired (fatigue).  A fever.  Sore throat.  Bad breath. How is this diagnosed? This condition is diagnosed based on:  Your symptoms.  Your medical history.  A physical exam.  Tests to find out if your condition is short-term (acute) or long-term (chronic). Your doctor may: ? Check your nose for growths  (polyps). ? Check your sinuses using a tool that has a light (endoscope). ? Check for allergies or germs. ? Do imaging tests, such as an MRI or CT scan. How is this treated? Treatment for this condition depends on the cause and whether it is short-term or long-term.  If caused by a virus, your symptoms should go away on their own within 10 days. You may be given medicines to relieve symptoms. They include: ? Medicines that shrink swollen tissue in the nose. ? Medicines that treat allergies (antihistamines). ? A spray that treats swelling of the nostrils. ? Rinses that help get rid of thick mucus in your nose (nasal saline washes).  If caused by bacteria, your doctor may wait to see if you will get better without treatment. You may be given antibiotic medicine if you have: ? A very bad infection. ? A weak body defense system.  If caused by growths in the nose, you may need to have surgery. Follow these instructions at home: Medicines  Take, use, or apply over-the-counter and prescription medicines only as told by your doctor. These may include nasal sprays.  If you were prescribed an antibiotic medicine, take it as told by your doctor. Do not stop taking the antibiotic even if you start to feel better. Hydrate and humidify  Drink enough water to keep your pee (urine) pale yellow.  Use a cool mist humidifier to keep the humidity level in your home above 50%.  Breathe in steam for 10-15 minutes, 3-4 times a day, or as told by your doctor. You can do this in the bathroom while a hot shower is running.  Try not to spend time in cool or dry air.   Rest  Rest as much as you can.  Sleep with your head raised (elevated).  Make sure you get enough sleep each night. General instructions  Put a warm, moist washcloth on your face 3-4 times a day, or as often as told by your doctor. This will help with discomfort.  Wash your hands often with soap and water. If there is no soap and  water, use hand sanitizer.  Do not smoke. Avoid being around people who are smoking (secondhand smoke).  Keep all follow-up visits as told by your doctor. This is important.   Contact a doctor if:  You have a fever.  Your symptoms get worse.  Your symptoms do not get better within 10 days. Get help right away if:  You have a very bad headache.  You cannot stop throwing up (vomiting).  You have very bad pain or swelling around your face or eyes.  You have trouble seeing.  You feel confused.  Your neck is stiff.  You have trouble breathing. Summary  Sinusitis is swelling of your sinuses. Sinuses are hollow spaces in the bones around your face.  This condition is caused by tissues in your nose that become inflamed or swollen. This traps germs. These can lead to infection.  If you were prescribed an antibiotic medicine, take it as told by your doctor. Do not stop taking it even if you start to feel better.  Keep all follow-up visits as told by your doctor. This is important. This information is not intended to replace advice given to you by your health care provider. Make sure you discuss any questions you have with your health care provider. Document Revised: 09/27/2017 Document Reviewed: 09/27/2017 Elsevier Patient Education  2021 [place]. Prednisolone tablets What is this medicine? PREDNISOLONE (pred NISS oh lone) is a corticosteroid. It is commonly used to treat inflammation of the skin, joints, lungs, and other organs. Common conditions treated include asthma, allergies, and arthritis. It is also used for other conditions, such as blood disorders and diseases of the adrenal glands. This medicine may be used for other purposes; ask your health care provider or pharmacist if you have questions. COMMON BRAND NAME(S): Millipred, Millipred DP, Millipred DP 12-Day, Millipred DP 6 Day, Prednoral What should I tell my health care provider before I take this medicine? They  need to know if you have any of these conditions:  Cushing's syndrome  diabetes  glaucoma  heart problems or disease  high blood pressure  infection such as herpes, measles, tuberculosis, or chickenpox  kidney disease  liver disease  mental problems  myasthenia gravis  osteoporosis  seizures  stomach ulcer or intestine disease including colitis and diverticulitis  thyroid problem  an unusual or allergic reaction to lactose, prednisolone, other medicines, foods, dyes, or preservatives  pregnant or trying to get pregnant  breast-feeding How should I use this medicine? Take this medicine by mouth with a glass of water. Follow the directions on the prescription label. Take it with food or milk to avoid stomach upset. If you are taking this medicine once a day, take it in the morning. Do not take more medicine than you are told to take. Do not suddenly stop taking your medicine because you may develop a severe reaction. Your doctor will tell you how much medicine to take. If your doctor wants you to stop the medicine, the dose may be slowly lowered over time to avoid any side effects. Talk to your pediatrician regarding the use of this medicine in children. Special care may be needed. Overdosage: If you think you have taken too much of this medicine contact a poison control center or emergency room at once. NOTE: This medicine is only for you. Do not share this medicine with others. What if I miss a dose? If you miss a dose, take it as soon as you can. If it is almost time for your next dose, take only that dose. Do not take double or extra doses. What may interact with this medicine? Do not take this medicine with any of the following medications:  metyrapone  mifepristone This medicine may also interact with the following medications:  aminoglutethimide  amphotericin B  aspirin and aspirin-like medicines  barbiturates  certain medicines for diabetes, like  glipizide or glyburide  cholestyramine  cholinesterase inhibitors  cyclosporine  digoxin  diuretics  ephedrine  female hormones, like estrogens and birth control pills  isoniazid  ketoconazole  NSAIDS, medicines for pain and inflammation, like ibuprofen or naproxen  phenytoin  rifampin  toxoids  vaccines  warfarin This list may not describe all possible interactions. Give your health care provider a list of all the medicines, herbs, non-prescription drugs, or dietary supplements you use. Also tell them if you smoke, drink alcohol, or use illegal drugs. Some items may interact with your medicine. What should I watch for while using this medicine? Visit your doctor or health care professional for regular checks on your progress. If you are taking this medicine over a prolonged period, carry an identification card with your name and address, the type and dose of your medicine, and your doctor's name and address. This medicine may increase your risk of getting an infection. Tell your doctor or health care professional if you are around anyone with measles or chickenpox, or if you develop sores or blisters that do not heal properly. If you are going to have surgery, tell your doctor or health care professional that you have taken this medicine within the last twelve months. Ask your doctor or health care professional about your diet. You may need to lower the amount of salt you eat. This medicine may increase blood sugar. Ask your healthcare provider if changes in diet or medicines are needed if you have diabetes. What side effects may I notice from receiving this medicine? Side effects that you should report to your doctor or health care professional as soon as possible:  allergic reactions like skin rash, itching or hives, swelling of the face, lips, or tongue  changes in emotions or moods  eye pain   signs and symptoms of high blood sugar such as being more thirsty or  hungry or having to urinate more than normal. You may also feel very tired or have blurry vision.  signs and symptoms of infection  like fever or chills; cough; sore throat; pain or trouble passing urine  slow growth in children (if used for longer periods of time)  swelling of ankles, feet  trouble sleeping  weak bones (if used for longer periods of time) Side effects that usually do not require medical attention (report to your doctor or health care professional if they continue or are bothersome):  nausea  skin problems, acne, thin and shiny skin  upset stomach  weight gain This list may not describe all possible side effects. Call your doctor for medical advice about side effects. You may report side effects to FDA at 1-800-FDA-1088. Where should I keep my medicine? Keep out of the reach of children. Store at room temperature between 15 and 30 degrees C (59 and 86 degrees F). Keep container tightly closed. Throw away any unused medicine after the expiration date. NOTE: This sheet is a summary. It may not cover all possible information. If you have questions about this medicine, talk to your doctor, pharmacist, or health care provider.  2021 Elsevier/Gold Standard (2018-01-27 10:30:56) Loratadine capsules or tablets What is this medicine? LORATADINE (lor AT a deen) is an antihistamine. It helps to relieve sneezing, runny nose, and itchy, watery eyes. This medicine is used to treat the symptoms of allergies. It is also used to treat itchy skin rash and hives. This medicine may be used for other purposes; ask your health care provider or pharmacist if you have questions. COMMON BRAND NAME(S): Alavert, Allergy Relief, Claritin, Claritin Hives Relief, Claritin Liqui-Gel, Claritin-D 24 Hour, Clear-Atadine, QlearQuil All Day & All Night Allergy Relief, Tavist ND What should I tell my health care provider before I take this medicine? They need to know if you have any of these  conditions:  asthma  kidney disease  liver disease  an unusual or allergic reaction to loratadine, other antihistamines, other medicines, foods, dyes, or preservatives  pregnant or trying to get pregnant  breast-feeding How should I use this medicine? Take this medicine by mouth with a glass of water. Follow the directions on the label. You may take this medicine with food or on an empty stomach. Take your medicine at regular intervals. Do not take your medicine more often than directed. Talk to your pediatrician regarding the use of this medicine in children. While this medicine may be used in children as young as 6 years for selected conditions, precautions do apply. Overdosage: If you think you have taken too much of this medicine contact a poison control center or emergency room at once. NOTE: This medicine is only for you. Do not share this medicine with others. What if I miss a dose? If you miss a dose, take it as soon as you can. If it is almost time for your next dose, take only that dose. Do not take double or extra doses. What may interact with this medicine?  other medicines for colds or allergies This list may not describe all possible interactions. Give your health care provider a list of all the medicines, herbs, non-prescription drugs, or dietary supplements you use. Also tell them if you smoke, drink alcohol, or use illegal drugs. Some items may interact with your medicine. What should I watch for while using this medicine? Tell your doctor or healthcare professional if your symptoms do not start to get better or if they get worse. Your mouth may get dry. Chewing sugarless gum or sucking hard candy, and drinking plenty of water may help. Contact your doctor if  the problem does not go away or is severe. You may get drowsy or dizzy. Do not drive, use machinery, or do anything that needs mental alertness until you know how this medicine affects you. Do not stand or sit up  quickly, especially if you are an older patient. This reduces the risk of dizzy or fainting spells. What side effects may I notice from receiving this medicine? Side effects that you should report to your doctor or health care professional as soon as possible:  allergic reactions like skin rash, itching or hives, swelling of the face, lips, or tongue  breathing problems  unusually restless or nervous Side effects that usually do not require medical attention (report to your doctor or health care professional if they continue or are bothersome):  drowsiness  dry or irritated mouth or throat  headache This list may not describe all possible side effects. Call your doctor for medical advice about side effects. You may report side effects to FDA at 1-800-FDA-1088. Where should I keep my medicine? Keep out of the reach of children. Store at room temperature between 2 and 30 degrees C (36 and 86 degrees F). Protect from moisture. Throw away any unused medicine after the expiration date. NOTE: This sheet is a summary. It may not cover all possible information. If you have questions about this medicine, talk to your doctor, pharmacist, or health care provider.  2021 Elsevier/Gold Standard (2007-10-31 17:17:24) Amoxicillin; Clavulanic Acid Tablets What is this medicine? AMOXICILLIN; CLAVULANIC ACID (a mox i SIL in; KLAV yoo lan ic AS id) is a penicillin antibiotic. It treats some infections caused by bacteria. It will not work for colds, the flu, or other viruses. This medicine may be used for other purposes; ask your health care provider or pharmacist if you have questions. COMMON BRAND NAME(S): Augmentin What should I tell my health care provider before I take this medicine? They need to know if you have any of these conditions:  kidney disease  liver disease  mononucleosis  stomach or intestine problems such as colitis  an unusual or allergic reaction to amoxicillin, other penicillin  or cephalosporin antibiotics, clavulanic acid, other medicines, foods, dyes, or preservatives  pregnant or trying to get pregnant  breast-feeding How should I use this medicine? Take this drug by mouth. Take it as directed on the prescription label at the same time every day. Take it with food at the start of a meal or snack. Take all of this drug unless your health care provider tells you to stop it early. Keep taking it even if you think you are better. Talk to your health care provider about the use of this drug in children. While it may be prescribed for selected conditions, precautions do apply. Overdosage: If you think you have taken too much of this medicine contact a poison control center or emergency room at once. NOTE: This medicine is only for you. Do not share this medicine with others. What if I miss a dose? If you miss a dose, take it as soon as you can. If it is almost time for your next dose, take only that dose. Do not take double or extra doses. What may interact with this medicine?  allopurinol  anticoagulants  birth control pills  methotrexate  probenecid This list may not describe all possible interactions. Give your health care provider a list of all the medicines, herbs, non-prescription drugs, or dietary supplements you use. Also tell them if you smoke, drink alcohol, or  use illegal drugs. Some items may interact with your medicine. What should I watch for while using this medicine? Tell your health care provider if your symptoms do not start to get better or if they get worse. This medicine may cause serious skin reactions. They can happen weeks to months after starting the medicine. Contact your health care provider right away if you notice fevers or flu-like symptoms with a rash. The rash may be red or purple and then turn into blisters or peeling of the skin. Or, you might notice a red rash with swelling of the face, lips or lymph nodes in your neck or under your  arms. Do not treat diarrhea with over the counter products. Contact your health care provider if you have diarrhea that lasts more than 2 days or if it is severe and watery. If you have diabetes, you may get a false-positive result for sugar in your urine. Check with your health care provider. Birth control may not work properly while you are taking this medicine. Talk to your health care provider about using an extra method of birth control. What side effects may I notice from receiving this medicine? Side effects that you should report to your doctor or health care provider as soon as possible:  allergic reactions (skin rash, itching or hives; swelling of the face, lips, or tongue)  bloody or watery diarrhea  dark urine  infection (fever, chills, cough, sore throat, or pain)  kidney injury (trouble passing urine or change in the amount of urine)  redness, blistering, peeling, or loosening of the skin, including inside the mouth  seizures  thrush (white patches in the mouth or mouth sores)  trouble breathing  unusual bruising or bleeding  unusually weak or tired Side effects that usually do not require medical attention (report to your doctor or health care provider if they continue or are bothersome):  diarrhea  dizziness  headache  nausea, vomiting  unusual vaginal discharge, itching, or odor  upset stomach This list may not describe all possible side effects. Call your doctor for medical advice about side effects. You may report side effects to FDA at 1-800-FDA-1088. Where should I keep my medicine? Keep out of the reach of children and pets. Store at room temperature between 20 and 25 degrees C (68 and 77 degrees F). Throw away any unused drug after the expiration date. NOTE: This sheet is a summary. It may not cover all possible information. If you have questions about this medicine, talk to your doctor, pharmacist, or health care provider.  2021 Elsevier/Gold  Standard (2020-03-20 09:45:03)

## 2020-08-27 NOTE — Progress Notes (Addendum)
Virtual telephone visit    Virtual Visit via Telephone Note   This visit type was conducted due to national recommendations for restrictions regarding the COVID-19 Pandemic (e.g. social distancing) in an effort to limit this patient's exposure and mitigate transmission in our community. Due to her co-morbid illnesses, this patient is at least at moderate risk for complications without adequate follow up. This format is felt to be most appropriate for this patient at this time. The patient did not have access to video technology or had technical difficulties with video requiring transitioning to audio format only (telephone). Physical exam was limited to content and character of the telephone converstion.   Parties involved in visit as below:   Patient location: at home Provider location: Provider: Provider's office at  [facility], [address] [place].     I discussed the limitations of evaluation and management by telemedicine and the availability of in person appointments. The patient expressed understanding and agreed to proceed.   Visit Date: 08/27/2020  Today's healthcare provider: Marcille Buffy, FNP   Chief Complaint  Patient presents with  . Sinus Problem   Subjective    Sinus Problem This is a new problem. The current episode started 1 to 4 weeks ago. The problem has been gradually worsening since onset. There has been no fever. Her pain is at a severity of 6/10. The pain is moderate. Associated symptoms include congestion, coughing, ear pain, headaches, a hoarse voice, sinus pressure and sneezing. Pertinent negatives include no chills, diaphoresis, neck pain, shortness of breath, sore throat or swollen glands. Treatments tried: Mucinex Sinus. The treatment provided mild relief.     Onset 2 weeks ago.  Green thick nasal discharge. Stuffy nose.   Patient  denies any fever, body aches,chills, rash, chest pain, shortness of breath, nausea, vomiting, or  diarrhea.      Medications: Outpatient Medications Prior to Visit  Medication Sig  . alendronate (FOSAMAX) 70 MG tablet Take 1 tablet (70 mg total) by mouth every Wednesday. Take with a full glass of water on an empty stomach.  Marland Kitchen aspirin EC 325 MG tablet Take 325 mg by mouth daily.  Marland Kitchen atorvastatin (LIPITOR) 20 MG tablet Take 1 tablet (20 mg total) by mouth at bedtime.  . Calcium Carbonate (CALCIUM 600 PO) Take 600 mg by mouth 2 (two) times a week.   . Cholecalciferol (VITAMIN D-3) 25 MCG (1000 UT) CAPS Take 1,000 Units by mouth daily.  Marland Kitchen FLUoxetine (PROZAC) 20 MG capsule TAKE 2 CAPSULES (40 MG TOTAL) BY MOUTH ONCE DAILY  . levothyroxine (SYNTHROID) 88 MCG tablet TAKE 1 TABLET BY MOUTH ONCE DAILY BEFORE BREAKFAST  . Multiple Vitamins-Minerals (EMERGEN-C IMMUNE PO) Take 1 tablet by mouth daily as needed (immunity boost).   Vladimir Faster Glycol-Propyl Glycol (LUBRICANT EYE DROPS) 0.4-0.3 % SOLN Place 1 drop into both eyes 2 (two) times daily.  . vitamin B-12 (CYANOCOBALAMIN) 500 MCG tablet Take 500 mcg by mouth daily.  . bacitracin-polymyxin b (POLYSPORIN) ophthalmic ointment 1 application at bedtime as needed. At bedtime and as needed throughout the day (Patient not taking: No sig reported)  . omeprazole (PRILOSEC) 20 MG capsule Take 1 capsule (20 mg total) by mouth daily.  . prednisoLONE acetate (PRED FORTE) 1 % ophthalmic suspension Place 1 drop into the left eye 4 (four) times daily. (Patient not taking: No sig reported)  . [DISCONTINUED] loratadine (CLARITIN) 10 MG tablet Take 1 tablet (10 mg total) by mouth daily. Reported on 05/17/2015 (Patient taking  differently: Take 10 mg by mouth daily as needed for allergies. Reported on 05/17/2015)   No facility-administered medications prior to visit.    Review of Systems  Constitutional: Negative for chills and diaphoresis.  HENT: Positive for congestion, ear pain, hoarse voice, sinus pressure and sneezing. Negative for sore throat.   Respiratory:  Positive for cough. Negative for shortness of breath.   Musculoskeletal: Negative for neck pain.  Neurological: Positive for headaches.      Objective    There were no vitals taken for this visit.    Patient is alert and oriented and responsive to questions Engages in conversation with provider. Speaks in full sentences without any pauses without any shortness of breath or distress.   Assessment & Plan     Acute non-recurrent pansinusitis - Plan: amoxicillin-clavulanate (AUGMENTIN) 875-125 MG tablet, predniSONE (DELTASONE) 20 MG tablet  Seasonal allergies - Plan: loratadine (CLARITIN) 10 MG tablet   Meds ordered this encounter  Medications  . loratadine (CLARITIN) 10 MG tablet    Sig: Take 1 tablet (10 mg total) by mouth daily. Reported on 05/17/2015    Dispense:  90 tablet    Refill:  0  . amoxicillin-clavulanate (AUGMENTIN) 875-125 MG tablet    Sig: Take 1 tablet by mouth 2 (two) times daily.    Dispense:  20 tablet    Refill:  0  . predniSONE (DELTASONE) 20 MG tablet    Sig: Take 1 tablet (20 mg total) by mouth daily with breakfast for 5 days.    Dispense:  5 tablet    Refill:  0    She has been off her Claritin, not taking any other 2nd generation antihistamine and advised to restart.   I discussed the limitations of evaluation and management by telemedicine and the availability of in person appointments. The patient expressed understanding and agreed to proceed.  Return in about 3 days (around 08/30/2020), or if symptoms worsen or fail to improve, for at any time for any worsening symptoms, Go to Emergency room/ urgent care if worse.    I discussed the assessment and treatment plan with the patient. The patient was provided an opportunity to ask questions and all were answered. The patient agreed with the plan and demonstrated an understanding of the instructions.   The patient was advised to call back or seek an in-person evaluation if the symptoms worsen or if the  condition fails to improve as anticipated.  I provided 20  minutes of non-face-to-face time during this encounter.  The entirety of the information documented in the History of Present Illness, Review of Systems and Physical Exam were personally obtained by me. Portions of this information were initially documented by the CMA and reviewed by me for thoroughness and accuracy.   Red Flags discussed. The patient was given clear instructions to go to ER or return to medical center if any red flags develop, symptoms do not improve, worsen or new problems develop. They verbalized understanding.   Marcille Buffy, [place] [phone] (phone) [phone] (fax)  [place]

## 2020-09-18 ENCOUNTER — Telehealth: Payer: Self-pay

## 2020-09-18 DIAGNOSIS — M810 Age-related osteoporosis without current pathological fracture: Secondary | ICD-10-CM

## 2020-09-18 DIAGNOSIS — E039 Hypothyroidism, unspecified: Secondary | ICD-10-CM

## 2020-09-18 NOTE — Telephone Encounter (Addendum)
[facility] faxed refill request for the following medications:  levothyroxine (SYNTHROID) 88 MCG tablet  alendronate (FOSAMAX) 70 MG tablet   Please advise.

## 2020-09-18 NOTE — Telephone Encounter (Signed)
Please review if okay to fill? Last time patient had her TSH checked was 10/10/19, would you like patient to have labs drawn before refilling medication? Fosamax last filled 04/10/20 okay for patient to continue? KW

## 2020-09-19 MED ORDER — LEVOTHYROXINE SODIUM 88 MCG PO TABS: 88.0000 ug | ORAL_TABLET | Freq: Every day | ORAL | 0 refills | Status: DC

## 2020-09-19 MED ORDER — ALENDRONATE SODIUM 70 MG PO TABS: 70.0000 mg | ORAL_TABLET | ORAL | 1 refills | Status: DC

## 2020-09-19 NOTE — Telephone Encounter (Signed)
Ok to refill 30 day supply and tell her that she needs appt to recheck on her chronic conditions and repeat labs before further refills are given.  This can be with any provider in our office.

## 2020-09-19 NOTE — Telephone Encounter (Signed)
Patient advised and scheduled follow up appt with Hailey Cain on 10/15/20. KW

## 2020-10-15 ENCOUNTER — Ambulatory Visit: Payer: Self-pay | Admitting: Family Medicine

## 2020-10-15 ENCOUNTER — Encounter: Payer: Medicare HMO | Admitting: Physician Assistant

## 2020-10-16 DIAGNOSIS — L570 Actinic keratosis: Secondary | ICD-10-CM | POA: Diagnosis not present

## 2020-10-16 DIAGNOSIS — C44722 Squamous cell carcinoma of skin of right lower limb, including hip: Secondary | ICD-10-CM | POA: Diagnosis not present

## 2020-10-16 DIAGNOSIS — D485 Neoplasm of uncertain behavior of skin: Secondary | ICD-10-CM | POA: Diagnosis not present

## 2020-10-21 ENCOUNTER — Other Ambulatory Visit: Payer: Self-pay | Admitting: Family Medicine

## 2020-10-21 DIAGNOSIS — E039 Hypothyroidism, unspecified: Secondary | ICD-10-CM

## 2020-10-21 MED ORDER — LEVOTHYROXINE SODIUM 88 MCG PO TABS: 88.0000 ug | ORAL_TABLET | Freq: Every day | ORAL | 0 refills | Status: DC

## 2020-10-21 NOTE — Telephone Encounter (Signed)
   Notes to clinic: Patient has appt on 11/01/2020 Requesting a 90 day supply Patient due for labs    Requested Prescriptions  Pending Prescriptions Disp Refills   levothyroxine (SYNTHROID) 88 MCG tablet 90 tablet 0    Sig: Take 1 tablet (88 mcg total) by mouth daily before breakfast.      Endocrinology:  Hypothyroid Agents Failed - 10/21/2020  8:18 AM      Failed - TSH needs to be rechecked within 3 months after an abnormal result. Refill until TSH is due.      Failed - TSH in normal range and within 360 days    TSH  Date Value Ref Range Status  10/10/2019 3.450 0.450 - 4.500 uIU/mL Final          Passed - Valid encounter within last 12 months    Recent Outpatient Visits           1 month ago Acute non-recurrent pansinusitis   [facility] [place], Kelby Aline, [place]   1 year ago Annual physical exam   [facility] Trinna Post, [place]   1 year ago Pre-operative clearance   [facility] [place], [place]   1 year ago Adult hypothyroidism   [place], [place], [place]   2 years ago Viral URI with cough   [facility] [place], [place]       Future Appointments             In 1 week Chrismon, Vickki Muff, PA-C [employer], [place]

## 2020-10-21 NOTE — Telephone Encounter (Signed)
Copied from [place] [id number]. Topic: Quick Communication - Rx Refill/Question >> Oct 21, 2020  8:12 AM Leward Quan A wrote: Medication: levothyroxine (SYNTHROID) 88 MCG tablet  Request 90 day supply please   Has the patient contacted their pharmacy? Yes.   (Agent: If no, request that the patient contact the pharmacy for the refill.) (Agent: If yes, when and what did the pharmacy advise?)  Preferred Pharmacy (with phone number or street name): [facility] [place]  Phone:  [phone] Fax:  [phone]     Agent: Please be advised that RX refills may take up to 3 business days. We ask that you follow-up with your pharmacy.

## 2020-10-30 DIAGNOSIS — D2271 Melanocytic nevi of right lower limb, including hip: Secondary | ICD-10-CM | POA: Diagnosis not present

## 2020-10-30 DIAGNOSIS — C44722 Squamous cell carcinoma of skin of right lower limb, including hip: Secondary | ICD-10-CM | POA: Diagnosis not present

## 2020-11-01 ENCOUNTER — Encounter: Payer: Self-pay | Admitting: Family Medicine

## 2020-11-01 ENCOUNTER — Other Ambulatory Visit: Payer: Self-pay

## 2020-11-01 ENCOUNTER — Ambulatory Visit (INDEPENDENT_AMBULATORY_CARE_PROVIDER_SITE_OTHER): Payer: Medicare HMO | Admitting: Family Medicine

## 2020-11-01 VITALS — BP 123/52 | HR 68 | Temp 98.2°F | Resp 16 | Ht 62.0 in | Wt 110.0 lb

## 2020-11-01 DIAGNOSIS — M810 Age-related osteoporosis without current pathological fracture: Secondary | ICD-10-CM

## 2020-11-01 DIAGNOSIS — E039 Hypothyroidism, unspecified: Secondary | ICD-10-CM

## 2020-11-01 DIAGNOSIS — E7800 Pure hypercholesterolemia, unspecified: Secondary | ICD-10-CM | POA: Diagnosis not present

## 2020-11-01 NOTE — Progress Notes (Signed)
Established patient visit   Patient: Hailey Cain   DOB: 12/01/1945   75 y.o. Female  MRN: [id number] Visit Date: 11/01/2020  Today's healthcare provider: Vernie Murders, PA-C   Chief Complaint  Patient presents with   Hypothyroidism   Hyperlipidemia   Subjective    HPI Hypothyroid, follow-up  Lab Results  Component Value Date   TSH 3.450 10/10/2019   TSH 7.510 (H) 10/05/2018   TSH 4.240 09/09/2017   T4TOTAL 4.5 02/11/2015   Wt Readings from Last 3 Encounters:  11/01/20 110 lb (49.9 kg)  10/10/19 117 lb 9.6 oz (53.3 kg)  10/10/19 117 lb 9.6 oz (53.3 kg)    She was last seen for hypothyroid 1 years ago by Carles Collet, PA.  Management since that visit includes no medication changes. She reports good compliance with treatment. She is not having side effects.   Symptoms: No change in energy level No constipation  No diarrhea No heat / cold intolerance  No nervousness No palpitations  No weight changes    Lipid/Cholesterol, Follow-up  Last lipid panel Other pertinent labs  Lab Results  Component Value Date   CHOL 190 10/10/2019   HDL 62 10/10/2019   LDLCALC 114 (H) 10/10/2019   TRIG 79 10/10/2019   CHOLHDL 3.1 10/10/2019   Lab Results  Component Value Date   ALT 11 10/10/2019   AST 20 10/10/2019   PLT 277 10/10/2019   TSH 3.450 10/10/2019     She was last seen for this 1 years ago.  Management since that visit includes discontinuing Simvastatin to Atorvastatin 20mg  daily.  She reports good compliance with treatment. She is not having side effects.   Symptoms: No chest pain No chest pressure/discomfort  No dyspnea No lower extremity edema  No numbness or tingling of extremity No orthopnea  No palpitations No paroxysmal nocturnal dyspnea  No speech difficulty No syncope   Current diet: well balanced Current exercise: no regular exercise  The 10-year ASCVD risk score Mikey Bussing DC Jr., et al., 2013) is: 13.3%  Depression,  Follow-up  She  was last seen for this 1 years ago. Changes made at last visit include no changes.   She reports good compliance with treatment. She is not having side effects.   She reports good tolerance of treatment.  She feels she is Unchanged since last visit.  Depression screen [facility] 2/9 11/01/2020 10/10/2019 10/05/2018  Decreased Interest 0 1 0  Down, Depressed, Hopeless 0 0 0  PHQ - 2 Score 0 1 0  Altered sleeping 0 - -  Tired, decreased energy 0 - -  Change in appetite 0 - -  Feeling bad or failure about yourself  0 - -  Trouble concentrating 3 - -  Moving slowly or fidgety/restless 0 - -  Suicidal thoughts 0 - -  PHQ-9 Score 3 - -  Difficult doing work/chores Not difficult at all - -    Past Medical History:  Diagnosis Date   Acid reflux 12/30/2006   Adult hypothyroidism 01/13/2007   Allergic rhinitis 09/07/2014   Anxiety 12/10/2014   Bronchitis 05/17/2015   Cancer of cheek ([place]) 07/04/2007   Depression, neurotic 12/30/2006   Hyperlipidemia    Prediabetes 08/11/2016   RAD (reactive airway disease) 09/07/2014   Small bowel obstruction ([place]) 01/31/2017   Past Surgical History:  Procedure Laterality Date   ABDOMINAL HYSTERECTOMY  1988   heavy, irregular menses; ovaries intact   AUGMENTATION MAMMAPLASTY Bilateral  silicone   BREAST SURGERY  1970   augmentation   CATARACT EXTRACTION Bilateral 2013   Dr. Talbert Forest.  [place].  [facility]-Distance.   EYE SURGERY     cataract extraction   EYE SURGERY  2007   LASIK   MEMBRANE PEEL Left 05/25/2019   Procedure: MEMBRANE PEEL;  Surgeon: Bernarda Caffey, MD;  Location: [facility];  Service: Ophthalmology;  Laterality: Left;   PARS PLANA VITRECTOMY Left 05/25/2019   Procedure: PARS PLANA VITRECTOMY WITH 25 GAUGE;  Surgeon: Bernarda Caffey, MD;  Location: [facility];  Service: Ophthalmology;  Laterality: Left;   TONSILLECTOMY AND ADENOIDECTOMY  1950   TUBAL LIGATION     YAG LASER APPLICATION Left 57/84/6962   Dr. Bernarda Caffey   Social History   Tobacco  Use   Smoking status: Former    Packs/day: 1.00    Years: 20.00    Pack years: 20.00    Types: Cigarettes    Quit date: 05/12/2003    Years since quitting: 17.4   Smokeless tobacco: Never  Vaping Use   Vaping Use: Never used  Substance Use Topics   Alcohol use: No    Alcohol/week: 0.0 standard drinks   Drug use: No   Family Status  Relation Name Status   Mother  Deceased at age 73   Father  Deceased at age 34       Heart Attack   Sister  [place]   No Known Allergies     Medications: Outpatient Medications Prior to Visit  Medication Sig   alendronate (FOSAMAX) 70 MG tablet Take 1 tablet (70 mg total) by mouth every Wednesday. Take with a full glass of water on an empty stomach.   amoxicillin-clavulanate (AUGMENTIN) 875-125 MG tablet Take 1 tablet by mouth 2 (two) times daily.   aspirin EC 325 MG tablet Take 325 mg by mouth daily.   atorvastatin (LIPITOR) 20 MG tablet Take 1 tablet (20 mg total) by mouth at bedtime.   Calcium Carbonate (CALCIUM 600 PO) Take 600 mg by mouth 2 (two) times a week.    Cholecalciferol (VITAMIN D-3) 25 MCG (1000 UT) CAPS Take 1,000 Units by mouth daily.   FLUoxetine (PROZAC) 20 MG capsule TAKE 2 CAPSULES (40 MG TOTAL) BY MOUTH ONCE DAILY   levothyroxine (SYNTHROID) 88 MCG tablet Take 1 tablet (88 mcg total) by mouth daily before breakfast.   loratadine (CLARITIN) 10 MG tablet Take 1 tablet (10 mg total) by mouth daily. Reported on 05/17/2015   Multiple Vitamins-Minerals (EMERGEN-C IMMUNE PO) Take 1 tablet by mouth daily as needed (immunity boost).    Polyethyl Glycol-Propyl Glycol (LUBRICANT EYE DROPS) 0.4-0.3 % SOLN Place 1 drop into both eyes 2 (two) times daily.   bacitracin-polymyxin b (POLYSPORIN) ophthalmic ointment 1 application at bedtime as needed. At bedtime and as needed throughout the day   omeprazole (PRILOSEC) 20 MG capsule Take 1 capsule (20 mg total) by mouth daily.   prednisoLONE acetate (PRED FORTE) 1  % ophthalmic suspension Place 1 drop into the left eye 4 (four) times daily.   vitamin B-12 (CYANOCOBALAMIN) 500 MCG tablet Take 500 mcg by mouth daily.   No facility-administered medications prior to visit.    Review of Systems  Constitutional: Negative.   Respiratory: Negative.    Cardiovascular: Negative.   Gastrointestinal: Negative.   Musculoskeletal: Negative.   Neurological: Negative.    Last CBC Lab Results  Component Value Date   WBC 4.6  10/10/2019   HGB 12.5 10/10/2019   HCT 40.4 10/10/2019   MCV 97 10/10/2019   MCH 30.0 10/10/2019   RDW 13.0 10/10/2019   PLT 277 64/33/2951   Last metabolic panel Lab Results  Component Value Date   GLUCOSE 83 10/10/2019   NA 140 10/10/2019   K 4.3 10/10/2019   CL 104 10/10/2019   CO2 24 10/10/2019   BUN 15 10/10/2019   CREATININE 0.64 10/10/2019   GFRNONAA 89 10/10/2019   GFRAA 102 10/10/2019   CALCIUM 8.5 (L) 10/10/2019   PROT 6.0 10/10/2019   ALBUMIN 4.0 10/10/2019   LABGLOB 2.0 10/10/2019   AGRATIO 2.0 10/10/2019   BILITOT <0.2 10/10/2019   ALKPHOS 43 (L) 10/10/2019   AST 20 10/10/2019   ALT 11 10/10/2019   ANIONGAP 3 (L) 02/03/2017   Last thyroid functions Lab Results  Component Value Date   TSH 3.450 10/10/2019   T4TOTAL 4.5 02/11/2015       Objective    BP (!) 123/52   Pulse 68   Temp 98.2 F (36.8 C)   Resp 16   Ht 5\' 2"  (1.575 m)   Wt 110 lb (49.9 kg)   BMI 20.12 kg/m  BP Readings from Last 3 Encounters:  11/01/20 (!) 123/52  10/10/19 130/68  10/10/19 130/68   Wt Readings from Last 3 Encounters:  11/01/20 110 lb (49.9 kg)  10/10/19 117 lb 9.6 oz (53.3 kg)  10/10/19 117 lb 9.6 oz (53.3 kg)    Physical Exam Constitutional:      General: She is not in acute distress.    Appearance: She is well-developed.  HENT:     Head: Normocephalic and atraumatic.     Right Ear: Hearing normal.     Left Ear: Hearing normal.     Nose: Nose normal.  Eyes:     General: Lids are normal. No scleral  icterus.       Right eye: No discharge.        Left eye: No discharge.     Conjunctiva/sclera: Conjunctivae normal.  Cardiovascular:     Rate and Rhythm: Normal rate and regular rhythm.     Pulses: Normal pulses.     Heart sounds: Normal heart sounds.  Pulmonary:     Effort: Pulmonary effort is normal. No respiratory distress.     Breath sounds: Normal breath sounds.  Abdominal:     General: Bowel sounds are normal.     Palpations: Abdomen is soft.  Musculoskeletal:        General: Normal range of motion.  Skin:    Findings: No lesion or rash.  Neurological:     Mental Status: She is alert and oriented to person, place, and time.  Psychiatric:        Speech: Speech normal.        Behavior: Behavior normal.        Thought Content: Thought content normal.      No results found for any visits on 11/01/20.  Assessment & Plan     1. Adult hypothyroidism No constipation with use of Metamucil. No intolerance to heat or cold. No tremors, sweats or hair loss. Recheck labs and continue Levothyroxine 88 mcg qd.  - CBC with Differential/Platelet - Comprehensive metabolic panel - TSH - T4  2. Hypercholesterolemia without hypertriglyceridemia Continues Atorvastatin 20 mg qd with low fat diet. Recheck labs. - CBC with Differential/Platelet - Comprehensive metabolic panel - Lipid panel - TSH  3. Age-related osteoporosis without  current pathological fracture Tolerating Alendronate each Friday. Recheck labs. - CBC with Differential/Platelet - Comprehensive metabolic panel   No follow-ups on file.     I, Daniesha Driver, PA-C, have reviewed all documentation for this visit. The documentation on 11/01/20 for the exam, diagnosis, procedures, and orders are all accurate and complete.    Vernie Murders, PA-C  [employer] [phone] (phone) [phone] (fax)  [place]

## 2020-11-02 LAB — CBC WITH DIFFERENTIAL/PLATELET
Basophils Absolute: 0 10*3/uL (ref 0.0–0.2)
Basos: 0 %
EOS (ABSOLUTE): 0.1 10*3/uL (ref 0.0–0.4)
Eos: 1 %
Hematocrit: 35.3 % (ref 34.0–46.6)
Hemoglobin: 11.8 g/dL (ref 11.1–15.9)
Immature Grans (Abs): 0 10*3/uL (ref 0.0–0.1)
Immature Granulocytes: 0 %
Lymphocytes Absolute: 1.7 10*3/uL (ref 0.7–3.1)
Lymphs: 17 %
MCH: 31.6 pg (ref 26.6–33.0)
MCHC: 33.4 g/dL (ref 31.5–35.7)
MCV: 95 fL (ref 79–97)
Monocytes Absolute: 0.7 10*3/uL (ref 0.1–0.9)
Monocytes: 7 %
Neutrophils Absolute: 7.7 10*3/uL — ABNORMAL HIGH (ref 1.4–7.0)
Neutrophils: 75 %
Platelets: 209 10*3/uL (ref 150–450)
RBC: 3.73 x10E6/uL — ABNORMAL LOW (ref 3.77–5.28)
RDW: 12.1 % (ref 11.7–15.4)
WBC: 10.3 10*3/uL (ref 3.4–10.8)

## 2020-11-02 LAB — COMPREHENSIVE METABOLIC PANEL
ALT: 11 IU/L (ref 0–32)
AST: 19 IU/L (ref 0–40)
Albumin/Globulin Ratio: 1.7 (ref 1.2–2.2)
Albumin: 4 g/dL (ref 3.7–4.7)
Alkaline Phosphatase: 44 IU/L (ref 44–121)
BUN/Creatinine Ratio: 30 — ABNORMAL HIGH (ref 12–28)
BUN: 20 mg/dL (ref 8–27)
Bilirubin Total: 0.2 mg/dL (ref 0.0–1.2)
CO2: 20 mmol/L (ref 20–29)
Calcium: 9 mg/dL (ref 8.7–10.3)
Chloride: 104 mmol/L (ref 96–106)
Creatinine, Ser: 0.66 mg/dL (ref 0.57–1.00)
Globulin, Total: 2.4 g/dL (ref 1.5–4.5)
Glucose: 93 mg/dL (ref 65–99)
Potassium: 4.4 mmol/L (ref 3.5–5.2)
Sodium: 141 mmol/L (ref 134–144)
Total Protein: 6.4 g/dL (ref 6.0–8.5)
eGFR: 92 mL/min/{1.73_m2} (ref >59)

## 2020-11-02 LAB — TSH: TSH: 0.292 u[IU]/mL — ABNORMAL LOW (ref 0.450–4.500)

## 2020-11-02 LAB — LIPID PANEL
Chol/HDL Ratio: 2.4 ratio (ref 0.0–4.4)
Cholesterol, Total: 166 mg/dL (ref 100–199)
HDL: 68 mg/dL (ref >39)
LDL Chol Calc (NIH): 83 mg/dL (ref 0–99)
Triglycerides: 82 mg/dL (ref 0–149)
VLDL Cholesterol Cal: 15 mg/dL (ref 5–40)

## 2020-11-02 LAB — T4: T4, Total: 7 ug/dL (ref 4.5–12.0)

## 2020-11-04 DIAGNOSIS — L0390 Cellulitis, unspecified: Secondary | ICD-10-CM | POA: Diagnosis not present

## 2020-11-13 DIAGNOSIS — L905 Scar conditions and fibrosis of skin: Secondary | ICD-10-CM | POA: Diagnosis not present

## 2020-11-22 ENCOUNTER — Other Ambulatory Visit: Payer: Self-pay | Admitting: Family Medicine

## 2020-11-22 DIAGNOSIS — F32A Depression, unspecified: Secondary | ICD-10-CM

## 2020-11-22 MED ORDER — FLUOXETINE HCL 20 MG PO CAPS: ORAL_CAPSULE | ORAL | 0 refills | Status: DC

## 2020-11-22 MED ORDER — OMEPRAZOLE 20 MG PO CPDR: 20.0000 mg | DELAYED_RELEASE_CAPSULE | Freq: Every day | ORAL | 1 refills | Status: DC

## 2020-11-22 NOTE — Telephone Encounter (Signed)
Medication Refill - Medication:   FLUoxetine (PROZAC) 20 MG capsule  omeprazole (PRILOSEC) 20 MG capsule  Has the patient contacted their pharmacy? Yes.  Contact pcp office.   Preferred Pharmacy (with phone number or street name):   [facility] ([facility] [place] ([place])  [id number]  Phone: [phone] Fax: [phone]    Agent: Please be advised that RX refills may take up to 3 business days. We ask that you follow-up with your pharmacy.

## 2020-12-31 ENCOUNTER — Telehealth: Payer: Self-pay | Admitting: Physician Assistant

## 2020-12-31 DIAGNOSIS — E7800 Pure hypercholesterolemia, unspecified: Secondary | ICD-10-CM

## 2020-12-31 MED ORDER — ATORVASTATIN CALCIUM 20 MG PO TABS: 20.0000 mg | ORAL_TABLET | Freq: Every day | ORAL | 3 refills | Status: DC

## 2020-12-31 NOTE — Telephone Encounter (Signed)
[facility] faxed refill request for the following medications:   atorvastatin (LIPITOR) 20 MG tablet    Please advise.

## 2021-01-21 ENCOUNTER — Other Ambulatory Visit: Payer: Self-pay | Admitting: Family Medicine

## 2021-01-21 ENCOUNTER — Other Ambulatory Visit: Payer: Self-pay | Admitting: Adult Health

## 2021-01-21 DIAGNOSIS — E039 Hypothyroidism, unspecified: Secondary | ICD-10-CM

## 2021-01-21 DIAGNOSIS — J302 Other seasonal allergic rhinitis: Secondary | ICD-10-CM

## 2021-01-21 NOTE — Telephone Encounter (Signed)
Labs reviewed by provider-T4 normal- advised continue current dose.

## 2021-01-22 NOTE — Telephone Encounter (Signed)
Call to pharmacy- Rx is ready for pick up.

## 2021-01-30 ENCOUNTER — Encounter (INDEPENDENT_AMBULATORY_CARE_PROVIDER_SITE_OTHER): Payer: Medicare HMO | Admitting: Ophthalmology

## 2021-01-30 ENCOUNTER — Encounter (INDEPENDENT_AMBULATORY_CARE_PROVIDER_SITE_OTHER): Payer: Self-pay

## 2021-01-30 DIAGNOSIS — H02402 Unspecified ptosis of left eyelid: Secondary | ICD-10-CM

## 2021-01-30 DIAGNOSIS — H26493 Other secondary cataract, bilateral: Secondary | ICD-10-CM

## 2021-01-30 DIAGNOSIS — H04123 Dry eye syndrome of bilateral lacrimal glands: Secondary | ICD-10-CM

## 2021-01-30 DIAGNOSIS — H35373 Puckering of macula, bilateral: Secondary | ICD-10-CM

## 2021-01-30 DIAGNOSIS — Z961 Presence of intraocular lens: Secondary | ICD-10-CM

## 2021-01-30 DIAGNOSIS — H3581 Retinal edema: Secondary | ICD-10-CM

## 2021-03-10 ENCOUNTER — Other Ambulatory Visit: Payer: Self-pay | Admitting: Family Medicine

## 2021-03-10 DIAGNOSIS — M810 Age-related osteoporosis without current pathological fracture: Secondary | ICD-10-CM

## 2021-03-10 NOTE — Telephone Encounter (Signed)
Requested medications are due for refill today.  yes  Requested medications are on the active medications list.  yes  Last refill. 09/25/2020  Future visit scheduled.   no  Notes to clinic.  No PCP listed.

## 2021-04-07 ENCOUNTER — Telehealth: Payer: Self-pay | Admitting: Family Medicine

## 2021-04-07 MED ORDER — OMEPRAZOLE 20 MG PO CPDR: 20.0000 mg | DELAYED_RELEASE_CAPSULE | Freq: Every day | ORAL | 1 refills | Status: DC

## 2021-04-07 NOTE — Telephone Encounter (Signed)
[facility] faxed refill request for the following medications:   omeprazole (PRILOSEC) 20 MG capsule   Please advise.

## 2021-04-08 ENCOUNTER — Telehealth: Payer: Self-pay | Admitting: Family Medicine

## 2021-04-08 DIAGNOSIS — E039 Hypothyroidism, unspecified: Secondary | ICD-10-CM

## 2021-04-08 MED ORDER — LEVOTHYROXINE SODIUM 88 MCG PO TABS: 88.0000 ug | ORAL_TABLET | Freq: Every day | ORAL | 0 refills | Status: DC

## 2021-04-08 NOTE — Telephone Encounter (Signed)
[facility] faxed refill request for the following medications:   EUTHYROX 88 MCG tablet   Please advise.

## 2021-04-14 ENCOUNTER — Telehealth: Payer: Self-pay

## 2021-04-14 DIAGNOSIS — F32A Depression, unspecified: Secondary | ICD-10-CM

## 2021-04-14 NOTE — Telephone Encounter (Signed)
Pharmacy requesting refill of FLUoxetine (PROZAC) 20 MG capsule [[id number]]    Order Details Dose, Route, Frequency: As Directed  Dispense Quantity: 124 capsule Refills: 0        Sig: TAKE 2 CAPSULES (40 MG TOTAL) BY MOUTH ONCE DAILY

## 2021-04-15 ENCOUNTER — Other Ambulatory Visit: Payer: Self-pay | Admitting: Physician Assistant

## 2021-04-15 ENCOUNTER — Other Ambulatory Visit: Payer: Self-pay | Admitting: Family Medicine

## 2021-04-15 DIAGNOSIS — M810 Age-related osteoporosis without current pathological fracture: Secondary | ICD-10-CM

## 2021-04-15 MED ORDER — FLUOXETINE HCL 20 MG PO CAPS: ORAL_CAPSULE | ORAL | 0 refills | Status: DC

## 2021-04-15 NOTE — Telephone Encounter (Signed)
Requested medication (s) are due for refill today: no  Requested medication (s) are on the active medication list: yes  Last refill:  03/12/21 #12 0 refills  Future visit scheduled: no  Notes to clinic:  no results found for Vit D labs, [facility] unable to schedule appt with new provider, receipt confirmed by pharmacy 03/12/21 at 10:40 am .. do you want to refill Rx?     Requested Prescriptions  Pending Prescriptions Disp Refills   alendronate (FOSAMAX) 70 MG tablet [Pharmacy Med Name: Alendronate Sodium 70 MG Oral Tablet] 12 tablet 0    Sig: TAKE 1 TABLET BY MOUTH EVERY WEDNESDAY, TAKE WITH A FULL GLASS OF WATER ON AND EMPTY STOMACH     Endocrinology:  Bisphosphonates Failed - 04/15/2021  1:00 PM      Failed - Vitamin D in normal range and within 360 days    No results found for: [id number], [id number], [id number], 25OHVITD3, 25OHVITD2, 25OHVITD3, 25OHVITD2, 25OHVITD1, 25OHVITD2, 25OHVITD3, VD25OH        Passed - Ca in normal range and within 360 days    Calcium  Date Value Ref Range Status  11/01/2020 9.0 8.7 - 10.3 mg/dL Final          Passed - Valid encounter within last 12 months    Recent Outpatient Visits           5 months ago Adult hypothyroidism   [facility], Vickki Muff, PA-C   7 months ago Acute non-recurrent pansinusitis   [facility], Kelby Aline, FNP   1 year ago Annual physical exam   [facility] [place], Wendee Beavers, PA-C   1 year ago Pre-operative clearance   [facility] [place], [place]   2 years ago Adult hypothyroidism   [facility] [place], [place], [place]

## 2021-05-13 NOTE — Progress Notes (Signed)
Established patient visit   Patient: Hailey Cain   DOB: Aug 11, 1945   76 y.o. Female  MRN: [id number] Visit Date: 05/14/2021  Today's healthcare provider: [name], PA-C   Cc. Hypothyroidism f/u , depression f/u  Hypothyroid, follow-up  Lab Results  Component Value Date   TSH 0.292 (L) 11/01/2020   TSH 3.450 10/10/2019   TSH 7.510 (H) 10/05/2018   T4TOTAL 7.0 11/01/2020   T4TOTAL 4.5 02/11/2015    Wt Readings from Last 3 Encounters:  05/14/21 108 lb 9.6 oz (49.3 kg)  11/01/20 110 lb (49.9 kg)  10/10/19 117 lb 9.6 oz (53.3 kg)    She was last seen for hypothyroid 6 months ago.  Management since that visit includes continuing current treatment. She reports excellent compliance with treatment. She is not having side effects, but does admit to more shaky feelings, sometimes her heart races.  Symptoms: No change in energy level No constipation  No diarrhea No heat / cold intolerance  No nervousness No palpitations  No weight changes    -----------------------------------------------------------------------------------------  Depression/Anxiety Report that her depression is stable with the Prozac.  Denies SI  She states that for many months she has felt a numbness at the base of bilateral feet, that sometimes extends up to her shins. Denies pain. Sometimes trips due to this numbness. Reports occasionally lower back pain. Denies hip pain, skin changes on feet/ankles/lower extremities.   Depression screen [facility] 2/9 05/14/2021 11/01/2020 10/10/2019  Decreased Interest 0 0 1  Down, Depressed, Hopeless 0 0 0  PHQ - 2 Score 0 0 1  Altered sleeping - 0 -  Tired, decreased energy - 0 -  Change in appetite - 0 -  Feeling bad or failure about yourself  - 0 -  Trouble concentrating - 3 -  Moving slowly or fidgety/restless - 0 -  Suicidal thoughts - 0 -  PHQ-9 Score - 3 -  Difficult doing work/chores - Not difficult at all -    Medications: Outpatient  Medications Prior to Visit  Medication Sig   alendronate (FOSAMAX) 70 MG tablet TAKE 1 TABLET BY MOUTH EVERY WEDNESDAY, TAKE WITH A FULL GLASS OF WATER ON AND EMPTY STOMACH   aspirin EC 325 MG tablet Take 325 mg by mouth daily.   atorvastatin (LIPITOR) 20 MG tablet Take 1 tablet (20 mg total) by mouth at bedtime.   Calcium Carbonate (CALCIUM 600 PO) Take 600 mg by mouth 2 (two) times a week.    Cholecalciferol (VITAMIN D-3) 25 MCG (1000 UT) CAPS Take 1,000 Units by mouth daily.   FLUoxetine (PROZAC) 20 MG capsule TAKE 2 CAPSULES (40 MG TOTAL) BY MOUTH ONCE DAILY   levothyroxine (EUTHYROX) 88 MCG tablet Take 1 tablet (88 mcg total) by mouth daily before breakfast.   loratadine (CLARITIN) 10 MG tablet Take 1 tablet by mouth once daily   Multiple Vitamins-Minerals (EMERGEN-C IMMUNE PO) Take 1 tablet by mouth daily as needed (immunity boost).    omeprazole (PRILOSEC) 20 MG capsule Take 1 capsule (20 mg total) by mouth daily.   Polyethyl Glycol-Propyl Glycol (LUBRICANT EYE DROPS) 0.4-0.3 % SOLN Place 1 drop into both eyes 2 (two) times daily.   vitamin B-12 (CYANOCOBALAMIN) 500 MCG tablet Take 500 mcg by mouth daily.   [DISCONTINUED] amoxicillin-clavulanate (AUGMENTIN) 875-125 MG tablet Take 1 tablet by mouth 2 (two) times daily.   No facility-administered medications prior to visit.    Review of Systems  Constitutional:  Negative for fatigue.  Respiratory:  Negative for cough and shortness of breath.   Cardiovascular:  Negative for chest pain and palpitations.  Neurological:  Positive for numbness. Negative for dizziness.     Objective    BP (!) 128/56 (BP Location: Left Arm, Patient Position: Sitting, Cuff Size: Small)    Pulse 67    Temp 98.4 F (36.9 C) (Oral)    Resp 16    Ht 5\' 1"  (1.549 m)    Wt 108 lb 9.6 oz (49.3 kg)    BMI 20.52 kg/m    Physical Exam Constitutional:      General: She is awake.     Appearance: She is well-developed.  HENT:     Head: Normocephalic.  Eyes:      Conjunctiva/sclera: Conjunctivae normal.  Cardiovascular:     Rate and Rhythm: Normal rate and regular rhythm.     Pulses:          Dorsalis pedis pulses are 2+ on the right side and 2+ on the left side.     Heart sounds: Normal heart sounds.  Pulmonary:     Effort: Pulmonary effort is normal.     Breath sounds: Normal breath sounds.  Feet:     Right foot:     Protective Sensation: 3 sites tested.  3 sites sensed.     Skin integrity: Skin integrity normal.     Left foot:     Protective Sensation: 3 sites tested.  3 sites sensed.     Skin integrity: Skin integrity normal.     Comments: Appropriate proprioception b/l toes.  Sensed all sites, but diminished per pt base of b/l big toes.  Skin:    General: Skin is warm.  Neurological:     Mental Status: She is alert and oriented to person, place, and time.  Psychiatric:        Attention and Perception: Attention normal.        Mood and Affect: Mood normal.        Speech: Speech normal.        Behavior: Behavior is cooperative.     No results found for any visits on 05/14/21.  Assessment & Plan     Peripheral neuropathy/foot numbness Will check vit b12 Ref to ortho  Problem List Items Addressed This Visit       Endocrine   Adult hypothyroidism - Primary    Last TSH was low and pt describing shaky symptoms/heart racing Will recheck tsh and t4 and make changes as indicated       Relevant Orders   TSH + free T4     Musculoskeletal and Integument   Osteoporosis   Relevant Orders   DG Bone Density     Other   Minor depression    Stable continue prozac      Other Visit Diagnoses     Other polyneuropathy       Relevant Orders   Vitamin B12   Ambulatory referral to Orthopedics   Comprehensive Metabolic Panel (CMET)        Return in about 6 months (around 11/11/2021) for chronic conditions.      I, [name], PA-C have reviewed all documentation for this visit. The documentation on  05/14/2021 for the  exam, diagnosis, procedures, and orders are all accurate and complete.    [name], PA-C  [facility] [phone] (phone) [phone] (fax)  [place]

## 2021-05-14 ENCOUNTER — Ambulatory Visit (INDEPENDENT_AMBULATORY_CARE_PROVIDER_SITE_OTHER): Payer: Medicare HMO | Admitting: Physician Assistant

## 2021-05-14 ENCOUNTER — Encounter: Payer: Self-pay | Admitting: Physician Assistant

## 2021-05-14 ENCOUNTER — Other Ambulatory Visit: Payer: Self-pay

## 2021-05-14 VITALS — BP 128/56 | HR 67 | Temp 98.4°F | Resp 16 | Ht 61.0 in | Wt 108.6 lb

## 2021-05-14 DIAGNOSIS — F32A Depression, unspecified: Secondary | ICD-10-CM | POA: Diagnosis not present

## 2021-05-14 DIAGNOSIS — E039 Hypothyroidism, unspecified: Secondary | ICD-10-CM

## 2021-05-14 DIAGNOSIS — G6289 Other specified polyneuropathies: Secondary | ICD-10-CM | POA: Diagnosis not present

## 2021-05-14 DIAGNOSIS — M810 Age-related osteoporosis without current pathological fracture: Secondary | ICD-10-CM | POA: Diagnosis not present

## 2021-05-14 NOTE — Assessment & Plan Note (Signed)
Last TSH was low and pt describing shaky symptoms/heart racing Will recheck tsh and t4 and make changes as indicated

## 2021-05-14 NOTE — Assessment & Plan Note (Signed)
Stable continue prozac

## 2021-05-15 ENCOUNTER — Telehealth: Payer: Self-pay | Admitting: Physician Assistant

## 2021-05-15 DIAGNOSIS — F32A Depression, unspecified: Secondary | ICD-10-CM

## 2021-05-15 DIAGNOSIS — E039 Hypothyroidism, unspecified: Secondary | ICD-10-CM

## 2021-05-15 DIAGNOSIS — E7800 Pure hypercholesterolemia, unspecified: Secondary | ICD-10-CM

## 2021-05-15 LAB — COMPREHENSIVE METABOLIC PANEL
ALT: 12 IU/L (ref 0–32)
AST: 16 IU/L (ref 0–40)
Albumin/Globulin Ratio: 2.3 — ABNORMAL HIGH (ref 1.2–2.2)
Albumin: 4.3 g/dL (ref 3.7–4.7)
Alkaline Phosphatase: 40 IU/L — ABNORMAL LOW (ref 44–121)
BUN/Creatinine Ratio: 26 (ref 12–28)
BUN: 18 mg/dL (ref 8–27)
Bilirubin Total: 0.2 mg/dL (ref 0.0–1.2)
CO2: 25 mmol/L (ref 20–29)
Calcium: 9.2 mg/dL (ref 8.7–10.3)
Chloride: 103 mmol/L (ref 96–106)
Creatinine, Ser: 0.7 mg/dL (ref 0.57–1.00)
Globulin, Total: 1.9 g/dL (ref 1.5–4.5)
Glucose: 89 mg/dL (ref 70–99)
Potassium: 4 mmol/L (ref 3.5–5.2)
Sodium: 140 mmol/L (ref 134–144)
Total Protein: 6.2 g/dL (ref 6.0–8.5)
eGFR: 90 mL/min/{1.73_m2} (ref >59)

## 2021-05-15 NOTE — Telephone Encounter (Signed)
Please advise on refill requests. Patient was seen in the office yesterday for Hypothyroidism.

## 2021-05-15 NOTE — Telephone Encounter (Signed)
[facility] faxed refill request for the following medications:  atorvastatin (LIPITOR) 20 MG tablet   FLUoxetine (PROZAC) 20 MG capsule   levothyroxine (EUTHYROX) 88 MCG tablet   omeprazole (PRILOSEC) 20 MG capsule   Please advise.

## 2021-05-19 ENCOUNTER — Other Ambulatory Visit: Payer: Self-pay

## 2021-05-19 ENCOUNTER — Telehealth: Payer: Self-pay

## 2021-05-19 MED ORDER — OMEPRAZOLE 20 MG PO CPDR: 20.0000 mg | DELAYED_RELEASE_CAPSULE | Freq: Every day | ORAL | 0 refills | Status: DC

## 2021-05-19 NOTE — Telephone Encounter (Signed)
[facility] faxed refill request for the following medications:  omeprazole (PRILOSEC) 20 MG capsule    Please advise.

## 2021-05-20 ENCOUNTER — Telehealth: Payer: Self-pay | Admitting: Physician Assistant

## 2021-05-20 NOTE — Telephone Encounter (Signed)
[facility] faxed refill request for the following medications:  atorvastatin (LIPITOR) 20 MG tablet   FLUoxetine (PROZAC) 20 MG capsule   levothyroxine (EUTHYROX) 88 MCG tablet    Please advise.

## 2021-05-21 ENCOUNTER — Other Ambulatory Visit: Payer: Self-pay

## 2021-05-21 DIAGNOSIS — E7800 Pure hypercholesterolemia, unspecified: Secondary | ICD-10-CM

## 2021-05-21 DIAGNOSIS — E039 Hypothyroidism, unspecified: Secondary | ICD-10-CM

## 2021-05-21 DIAGNOSIS — F32A Depression, unspecified: Secondary | ICD-10-CM

## 2021-05-21 MED ORDER — ATORVASTATIN CALCIUM 20 MG PO TABS: 20.0000 mg | ORAL_TABLET | Freq: Every day | ORAL | 1 refills | Status: DC

## 2021-05-21 MED ORDER — LEVOTHYROXINE SODIUM 88 MCG PO TABS: 88.0000 ug | ORAL_TABLET | Freq: Every day | ORAL | 1 refills | Status: DC

## 2021-05-21 MED ORDER — FLUOXETINE HCL 20 MG PO CAPS: ORAL_CAPSULE | ORAL | 1 refills | Status: DC

## 2021-05-21 NOTE — Telephone Encounter (Signed)
Converted to refill request

## 2021-05-22 ENCOUNTER — Other Ambulatory Visit: Payer: Self-pay

## 2021-05-22 DIAGNOSIS — E039 Hypothyroidism, unspecified: Secondary | ICD-10-CM

## 2021-05-22 DIAGNOSIS — F32A Depression, unspecified: Secondary | ICD-10-CM

## 2021-05-22 DIAGNOSIS — E7800 Pure hypercholesterolemia, unspecified: Secondary | ICD-10-CM

## 2021-05-22 MED ORDER — LEVOTHYROXINE SODIUM 88 MCG PO TABS: 88.0000 ug | ORAL_TABLET | Freq: Every day | ORAL | 1 refills | Status: DC

## 2021-05-22 MED ORDER — ATORVASTATIN CALCIUM 20 MG PO TABS: 20.0000 mg | ORAL_TABLET | Freq: Every day | ORAL | 1 refills | Status: DC

## 2021-05-22 MED ORDER — FLUOXETINE HCL 20 MG PO CAPS: ORAL_CAPSULE | ORAL | 1 refills | Status: DC

## 2021-05-22 NOTE — Telephone Encounter (Addendum)
Requesting atorvastatin (LIPITOR) 20 MG tablet, FLUoxetine (PROZAC) 20 MG capsule and levothyroxine (EUTHYROX) 88 MCG tablet please send to   [place], [place]

## 2021-05-26 ENCOUNTER — Telehealth: Payer: Self-pay

## 2021-05-26 NOTE — Telephone Encounter (Signed)
Copied from [place] [id number]. Topic: Referral - Question >> May 26, 2021 10:55 AM Greggory Keen D wrote: Reason for CRM: Pt called asking if Ria Comment could send her to a Podiatrist in the [place] area, not [place] or [place].  CB#  [phone]

## 2021-05-30 ENCOUNTER — Other Ambulatory Visit: Payer: Self-pay | Admitting: Physician Assistant

## 2021-05-30 DIAGNOSIS — M810 Age-related osteoporosis without current pathological fracture: Secondary | ICD-10-CM

## 2021-05-30 MED ORDER — ALENDRONATE SODIUM 70 MG PO TABS: 70.0000 mg | ORAL_TABLET | ORAL | 1 refills | Status: DC

## 2021-05-30 NOTE — Telephone Encounter (Signed)
Requested medication (s) are due for refill today: yes  Requested medication (s) are on the active medication list: yes  Last refill:  03/12/21 #12/0  Future visit scheduled: yes  Notes to clinic:  Unable to refill per protocol due to failed labs, no updated results.      Requested Prescriptions  Pending Prescriptions Disp Refills   alendronate (FOSAMAX) 70 MG tablet 12 tablet 0    Sig: Take with a full glass of water on an empty stomach.     Endocrinology:  Bisphosphonates Failed - 05/30/2021  2:19 PM      Failed - Vitamin D in normal range and within 360 days    No results found for: [id number], [id number], [id number], 25OHVITD3, 25OHVITD2, 25OHVITD3, 25OHVITD2, 25OHVITD1, 25OHVITD2, 25OHVITD3, VD25OH        Passed - Ca in normal range and within 360 days    Calcium  Date Value Ref Range Status  05/14/2021 9.2 8.7 - 10.3 mg/dL Final          Passed - Valid encounter within last 12 months    Recent Outpatient Visits           2 weeks ago Adult hypothyroidism   [facility] Thedore Mins, [place], PA-C   7 months ago Adult hypothyroidism   [facility], Vickki Muff, PA-C   9 months ago Acute non-recurrent pansinusitis   [facility], Kelby Aline, FNP   1 year ago Annual physical exam   [facility] Carles Collet M, [place]   2 years ago Pre-operative clearance   [facility] Trinna Post, [place]       Future Appointments             In 5 months Thedore Mins, Ria Comment, PA-C [employer], [place]

## 2021-05-30 NOTE — Telephone Encounter (Signed)
Copied from [place] [id number]. Topic: Quick Communication - Rx Refill/Question >> May 30, 2021  1:05 PM Leward Quan A wrote: Medication: alendronate (FOSAMAX) 70 MG tablet  Has the patient contacted their pharmacy? Yes changed pharmacy need new Rx  (Agent: If no, request that the patient contact the pharmacy for the refill. If patient does not wish to contact the pharmacy document the reason why and proceed with request.) (Agent: If yes, when and what did the pharmacy advise?)  Preferred Pharmacy (with phone number or street name): [place], [place]  Phone:  [phone] Fax:  [phone]    Has the patient been seen for an appointment in the last year OR does the patient have an upcoming appointment? Yes.    Agent: Please be advised that RX refills may take up to 3 business days. We ask that you follow-up with your pharmacy.

## 2021-06-02 ENCOUNTER — Other Ambulatory Visit: Payer: Self-pay | Admitting: Physician Assistant

## 2021-06-02 DIAGNOSIS — M810 Age-related osteoporosis without current pathological fracture: Secondary | ICD-10-CM

## 2021-06-06 DIAGNOSIS — M545 Low back pain, unspecified: Secondary | ICD-10-CM | POA: Insufficient documentation

## 2021-06-06 DIAGNOSIS — R200 Anesthesia of skin: Secondary | ICD-10-CM | POA: Diagnosis not present

## 2021-06-09 ENCOUNTER — Telehealth: Payer: Self-pay

## 2021-06-09 NOTE — Telephone Encounter (Signed)
Copied from [place] [id number]. Topic: General - Other >> Jun 09, 2021  2:49 PM Antonieta Iba C wrote: Reason for CRM: pt called in stating she is returning the office call. Not showing a note in chart. Please assist pt further.

## 2021-06-16 ENCOUNTER — Other Ambulatory Visit: Payer: Self-pay | Admitting: Physician Assistant

## 2021-06-19 DIAGNOSIS — M2569 Stiffness of other specified joint, not elsewhere classified: Secondary | ICD-10-CM | POA: Insufficient documentation

## 2021-06-23 DIAGNOSIS — M545 Low back pain, unspecified: Secondary | ICD-10-CM | POA: Diagnosis not present

## 2021-06-23 DIAGNOSIS — M2560 Stiffness of unspecified joint, not elsewhere classified: Secondary | ICD-10-CM | POA: Diagnosis not present

## 2021-06-25 DIAGNOSIS — M2560 Stiffness of unspecified joint, not elsewhere classified: Secondary | ICD-10-CM | POA: Diagnosis not present

## 2021-06-25 DIAGNOSIS — M545 Low back pain, unspecified: Secondary | ICD-10-CM | POA: Diagnosis not present

## 2021-06-26 ENCOUNTER — Ambulatory Visit
Admission: RE | Admit: 2021-06-26 | Discharge: 2021-06-26 | Disposition: A | Discharge status: Home / Self Care | Payer: Medicare HMO | Source: Ambulatory Visit | Attending: Physician Assistant | Admitting: Physician Assistant

## 2021-06-26 ENCOUNTER — Other Ambulatory Visit: Payer: Self-pay

## 2021-06-26 DIAGNOSIS — M810 Age-related osteoporosis without current pathological fracture: Secondary | ICD-10-CM | POA: Insufficient documentation

## 2021-06-26 DIAGNOSIS — M8589 Other specified disorders of bone density and structure, multiple sites: Secondary | ICD-10-CM | POA: Diagnosis not present

## 2021-06-26 DIAGNOSIS — Z780 Asymptomatic menopausal state: Secondary | ICD-10-CM | POA: Diagnosis not present

## 2021-07-10 DIAGNOSIS — M2560 Stiffness of unspecified joint, not elsewhere classified: Secondary | ICD-10-CM | POA: Diagnosis not present

## 2021-07-10 DIAGNOSIS — M545 Low back pain, unspecified: Secondary | ICD-10-CM | POA: Diagnosis not present

## 2021-07-15 DIAGNOSIS — M545 Low back pain, unspecified: Secondary | ICD-10-CM | POA: Diagnosis not present

## 2021-07-15 DIAGNOSIS — M2560 Stiffness of unspecified joint, not elsewhere classified: Secondary | ICD-10-CM | POA: Diagnosis not present

## 2021-07-17 DIAGNOSIS — M545 Low back pain, unspecified: Secondary | ICD-10-CM | POA: Diagnosis not present

## 2021-07-17 DIAGNOSIS — M2560 Stiffness of unspecified joint, not elsewhere classified: Secondary | ICD-10-CM | POA: Diagnosis not present

## 2021-07-18 DIAGNOSIS — M5412 Radiculopathy, cervical region: Secondary | ICD-10-CM | POA: Diagnosis not present

## 2021-07-18 DIAGNOSIS — M5416 Radiculopathy, lumbar region: Secondary | ICD-10-CM | POA: Diagnosis not present

## 2021-07-18 DIAGNOSIS — M546 Pain in thoracic spine: Secondary | ICD-10-CM | POA: Diagnosis not present

## 2021-07-24 DIAGNOSIS — M545 Low back pain, unspecified: Secondary | ICD-10-CM | POA: Diagnosis not present

## 2021-07-24 DIAGNOSIS — M2560 Stiffness of unspecified joint, not elsewhere classified: Secondary | ICD-10-CM | POA: Diagnosis not present

## 2021-07-29 DIAGNOSIS — M5416 Radiculopathy, lumbar region: Secondary | ICD-10-CM | POA: Diagnosis not present

## 2021-07-29 DIAGNOSIS — M546 Pain in thoracic spine: Secondary | ICD-10-CM | POA: Diagnosis not present

## 2021-07-29 DIAGNOSIS — M2560 Stiffness of unspecified joint, not elsewhere classified: Secondary | ICD-10-CM | POA: Diagnosis not present

## 2021-07-29 DIAGNOSIS — M545 Low back pain, unspecified: Secondary | ICD-10-CM | POA: Diagnosis not present

## 2021-07-29 DIAGNOSIS — M5412 Radiculopathy, cervical region: Secondary | ICD-10-CM | POA: Diagnosis not present

## 2021-07-31 DIAGNOSIS — M2560 Stiffness of unspecified joint, not elsewhere classified: Secondary | ICD-10-CM | POA: Diagnosis not present

## 2021-07-31 DIAGNOSIS — M545 Low back pain, unspecified: Secondary | ICD-10-CM | POA: Diagnosis not present

## 2021-08-05 DIAGNOSIS — M545 Low back pain, unspecified: Secondary | ICD-10-CM | POA: Diagnosis not present

## 2021-08-05 DIAGNOSIS — M2560 Stiffness of unspecified joint, not elsewhere classified: Secondary | ICD-10-CM | POA: Diagnosis not present

## 2021-08-07 DIAGNOSIS — M545 Low back pain, unspecified: Secondary | ICD-10-CM | POA: Diagnosis not present

## 2021-08-07 DIAGNOSIS — M2560 Stiffness of unspecified joint, not elsewhere classified: Secondary | ICD-10-CM | POA: Diagnosis not present

## 2021-08-21 DIAGNOSIS — M1812 Unilateral primary osteoarthritis of first carpometacarpal joint, left hand: Secondary | ICD-10-CM | POA: Diagnosis not present

## 2021-08-21 DIAGNOSIS — G5603 Carpal tunnel syndrome, bilateral upper limbs: Secondary | ICD-10-CM | POA: Diagnosis not present

## 2021-09-09 DIAGNOSIS — G5603 Carpal tunnel syndrome, bilateral upper limbs: Secondary | ICD-10-CM | POA: Diagnosis not present

## 2021-09-09 DIAGNOSIS — M1811 Unilateral primary osteoarthritis of first carpometacarpal joint, right hand: Secondary | ICD-10-CM | POA: Diagnosis not present

## 2021-09-09 DIAGNOSIS — M19042 Primary osteoarthritis, left hand: Secondary | ICD-10-CM | POA: Diagnosis not present

## 2021-09-09 DIAGNOSIS — M1812 Unilateral primary osteoarthritis of first carpometacarpal joint, left hand: Secondary | ICD-10-CM | POA: Diagnosis not present

## 2021-09-19 DIAGNOSIS — G5603 Carpal tunnel syndrome, bilateral upper limbs: Secondary | ICD-10-CM | POA: Diagnosis not present

## 2021-09-29 ENCOUNTER — Ambulatory Visit (INDEPENDENT_AMBULATORY_CARE_PROVIDER_SITE_OTHER): Payer: Medicare HMO

## 2021-09-29 VITALS — Wt 108.0 lb

## 2021-09-29 DIAGNOSIS — Z0000 Encounter for general adult medical examination without abnormal findings: Secondary | ICD-10-CM | POA: Diagnosis not present

## 2021-09-29 NOTE — Progress Notes (Signed)
Virtual Visit via Telephone Note  I connected with  Hailey Cain on 09/29/21 at 10:30 AM EDT by telephone and verified that I am speaking with the correct person using two identifiers.  Location: Patient: home Provider: [facility] Persons participating in the virtual visit: [place]   I discussed the limitations, risks, security and privacy concerns of performing an evaluation and management service by telephone and the availability of in person appointments. The patient expressed understanding and agreed to proceed.  Interactive audio and video telecommunications were attempted between this nurse and patient, however failed, due to patient having technical difficulties OR patient did not have access to video capability.  We continued and completed visit with audio only.  Some vital signs may be absent or patient reported.   Hailey David, LPN  Subjective:   Hailey Cain is a 76 y.o. female who presents for Medicare Annual (Subsequent) preventive examination.  Review of Systems           Objective:    There were no vitals filed for this visit. There is no height or weight on file to calculate BMI.     09/29/2021   10:42 AM 10/10/2019    9:07 AM 05/25/2019    9:22 AM 10/05/2018    9:16 AM 09/28/2017    2:35 PM 01/31/2017    8:34 PM 01/31/2017    1:34 PM  Advanced Directives  Does Patient Have a Medical Advance Directive? No No No Yes Yes No No  Type of Theatre stage manager of [place];Living will [place] in Chart?    No - copy requested No - copy requested    Would patient like information on creating a medical advance directive? No - Patient declined No - Patient declined Yes (Inpatient - patient requests chaplain consult to create a medical advance directive)   No - Patient declined     Current Medications (verified) Outpatient Encounter Medications as of  09/29/2021  Medication Sig   alendronate (FOSAMAX) 70 MG tablet Take 1 tablet (70 mg total) by mouth once a week. Take with a full glass of water on an empty stomach.   aspirin EC 325 MG tablet Take 325 mg by mouth daily.   atorvastatin (LIPITOR) 20 MG tablet Take 1 tablet (20 mg total) by mouth at bedtime.   Calcium Carbonate (CALCIUM 600 PO) Take 600 mg by mouth 2 (two) times a week.    Cholecalciferol (VITAMIN D-3) 25 MCG (1000 UT) CAPS Take 1,000 Units by mouth daily.   FLUoxetine (PROZAC) 20 MG capsule TAKE 2 CAPSULES (40 MG TOTAL) BY MOUTH ONCE DAILY   gabapentin (NEURONTIN) 100 MG capsule gabapentin 100 mg capsule  TAKE 1 CAPSULE BY MOUTH THREE TIMES DAILY   levothyroxine (EUTHYROX) 88 MCG tablet Take 1 tablet (88 mcg total) by mouth daily before breakfast.   loratadine (CLARITIN) 10 MG tablet Take 1 tablet by mouth once daily   Multiple Vitamins-Minerals (EMERGEN-C IMMUNE PO) Take 1 tablet by mouth daily as needed (immunity boost).    omeprazole (PRILOSEC) 20 MG capsule TAKE 1 CAPSULE BY MOUTH DAILY. NEED MD APPOINTMENT FOR REFILLS   vitamin B-12 (CYANOCOBALAMIN) 500 MCG tablet Take 500 mcg by mouth daily.   chlorhexidine (PERIDEX) 0.12 % solution chlorhexidine gluconate 0.12 % mouthwash  RINSE MOUTH WITH 15MLS (1 CAPFUL) FOR 30 SECONDS MORNING AND EVENING AFTER TOOTHBRUSHING. EXPECTORATE AFTER RINSING. DO NOT SWALLOW (  Patient not taking: Reported on 09/29/2021)   HYDROcodone-acetaminophen (NORCO/VICODIN) 5-325 MG tablet hydrocodone 5 mg-acetaminophen 325 mg tablet  TAKE 1 TABLET BY MOUTH EVERY 6 HOURS AS NEEDED FOR PAIN (Patient not taking: Reported on 09/29/2021)   Polyethyl Glycol-Propyl Glycol (LUBRICANT EYE DROPS) 0.4-0.3 % SOLN Place 1 drop into both eyes 2 (two) times daily. (Patient not taking: Reported on 09/29/2021)   No facility-administered encounter medications on file as of 09/29/2021.    Allergies (verified) Patient has no known allergies.   History: Past Medical  History:  Diagnosis Date   Acid reflux 12/30/2006   Adult hypothyroidism 01/13/2007   Allergic rhinitis 09/07/2014   Anxiety 12/10/2014   Bronchitis 05/17/2015   Cancer of cheek ([place]) 07/04/2007   Depression, neurotic 12/30/2006   Hyperlipidemia    Prediabetes 08/11/2016   RAD (reactive airway disease) 09/07/2014   Small bowel obstruction ([place]) 01/31/2017   Past Surgical History:  Procedure Laterality Date   ABDOMINAL HYSTERECTOMY  1988   heavy, irregular menses; ovaries intact   AUGMENTATION MAMMAPLASTY Bilateral    silicone   BREAST SURGERY  1970   augmentation   CATARACT EXTRACTION Bilateral 2013   Dr. Talbert Forest.  [place].  OD-Distance.   EYE SURGERY     cataract extraction   EYE SURGERY  2007   LASIK   MEMBRANE PEEL Left 05/25/2019   Procedure: MEMBRANE PEEL;  Surgeon: Bernarda Caffey, MD;  Location: [facility];  Service: Ophthalmology;  Laterality: Left;   PARS PLANA VITRECTOMY Left 05/25/2019   Procedure: PARS PLANA VITRECTOMY WITH 25 GAUGE;  Surgeon: Bernarda Caffey, MD;  Location: [facility];  Service: Ophthalmology;  Laterality: Left;   TONSILLECTOMY AND ADENOIDECTOMY  1950   TUBAL LIGATION     YAG LASER APPLICATION Left 78/29/5621   Dr. Bernarda Caffey   Family History  Problem Relation Age of Onset   Stroke Mother    CAD Mother    Heart attack Father    Hyperthyroidism Sister    Heart Problems Brother    Cancer Brother        skin   CAD Brother        CABG   Social History   Socioeconomic History   Marital status: Widowed    Spouse name: Laban Emperor   Number of children: 1   Years of education: H/S   Highest education level: 12th grade  Occupational History   Occupation: retired  Tobacco Use   Smoking status: Former    Packs/day: 1.00    Years: 20.00    Pack years: 20.00    Types: Cigarettes    Quit date: 05/12/2003    Years since quitting: 18.3   Smokeless tobacco: Never  Vaping Use   Vaping Use: Never used  Substance and Sexual Activity   Alcohol use: No    Alcohol/week:  0.0 standard drinks   Drug use: No   Sexual activity: Not on file  Other Topics Concern   Not on file  Social History Narrative   Not on file   Social Determinants of Health   Financial Resource Strain: Low Risk    Difficulty of Paying Living Expenses: Not hard at all  Food Insecurity: No Food Insecurity   Worried About Charity fundraiser in the Last Year: Never true   [place] in the Last Year: Never true  Transportation Needs: No Transportation Needs   Lack of Transportation (Medical): No   Lack of Transportation (Non-Medical): No  Physical Activity: Insufficiently  Active   Days of Exercise per Week: 2 days   Minutes of Exercise per Session: 20 min  Stress: No Stress Concern Present   Feeling of Stress : Not at all  Social Connections: Moderately Isolated   Frequency of Communication with Friends and Family: Twice a week   Frequency of Social Gatherings with Friends and Family: More than three times a week   Attends Religious Services: More than 4 times per year   Active Member of [employer] or Organizations: No   Attends Archivist Meetings: Never   Marital Status: Widowed    Tobacco Counseling Counseling given: Not Answered   Clinical Intake:  Pre-visit preparation completed: Yes  Pain : No/denies pain     Nutritional Risks: None Diabetes: No  How often do you need to have someone help you when you read instructions, pamphlets, or other written materials from your doctor or pharmacy?: 1 - Never  Diabetic?no  Interpreter Needed?: No  Information entered by :: Kirke Shaggy, LPN   Activities of Daily Living    05/14/2021    8:26 AM 11/01/2020   10:00 AM  In your present state of health, do you have any difficulty performing the following activities:  Hearing? 1 1  Vision? 0 0  Difficulty concentrating or making decisions? 0 0  Walking or climbing stairs? 0 0  Dressing or bathing? 0 0  Doing errands, shopping? 0 0    Patient Care  Team: [name], PA-C as PCP - General (Physician Assistant)  Indicate any recent Medical Services you may have received from other than [facility] providers in the past year (date may be approximate).     Assessment:   This is a routine wellness examination for [place].  Hearing/Vision screen No results found.  Dietary issues and exercise activities discussed:     Goals Addressed             This Visit's Progress    DIET - EAT MORE FRUITS AND VEGETABLES         Depression Screen    09/29/2021   10:40 AM 05/14/2021    8:26 AM 11/01/2020   10:00 AM 10/10/2019    9:04 AM 10/05/2018    9:17 AM 03/29/2018    9:30 AM 09/28/2017    2:35 PM  PHQ 2/9 Scores  PHQ - 2 Score 0 0 0 1 0 0 2  PHQ- 9 Score   3   0 7    Fall Risk    09/29/2021   10:44 AM 05/14/2021    8:26 AM 11/01/2020   10:00 AM 10/10/2019    9:07 AM 10/05/2018    9:17 AM  Fall Risk   Falls in the past year? '1 1 1 '$ 0 0  Number falls in past yr: 0 1 0 0   Injury with Fall? 0 0 0 0   Risk for fall due to : History of fall(s)      Follow up Falls prevention discussed  Falls evaluation completed      FALL RISK PREVENTION PERTAINING TO THE HOME:  Any stairs in or around the home? No  If so, are there any without handrails? No  Home free of loose throw rugs in walkways, pet beds, electrical cords, etc? Yes  Adequate lighting in your home to reduce risk of falls? Yes   ASSISTIVE DEVICES UTILIZED TO PREVENT FALLS:  Life alert? No  Use of a cane, walker or w/c? No  Grab bars in  the bathroom? No  Shower chair or bench in shower? No  Elevated toilet seat or a handicapped toilet? No   Cognitive Function:2 points 6CIT (missed one month)        10/10/2019    9:11 AM 08/11/2016    9:21 AM  6CIT Screen  What Year? 0 points 0 points  What month? 0 points 0 points  What time? 0 points 0 points  Count back from 20 0 points 0 points  Months in reverse 2 points 0 points  Repeat phrase 0 points 4 points  Total Score 2  points 4 points    Immunizations Immunization History  Administered Date(s) Administered   Influenza, High Dose Seasonal PF 02/13/2015, 02/08/2017, 02/04/2018   Influenza-Unspecified 02/06/2019, 02/03/2021   PFIZER Comirnaty(Gray Top)Covid-19 Tri-Sucrose Vaccine 10/21/2020   PFIZER(Purple Top)SARS-COV-2 Vaccination 07/07/2019, 08/04/2019   Pneumococcal Conjugate-13 02/13/2015   Pneumococcal Polysaccharide-23 05/01/2017   Td 10/16/2010, 02/28/2018   Tdap 10/16/2010, 02/28/2018   Zoster Recombinat (Shingrix) 10/21/2020   Zoster, Live 10/27/2014    TDAP status: Up to date  Flu Vaccine status: Declined, Education has been provided regarding the importance of this vaccine but patient still declined. Advised may receive this vaccine at local pharmacy or Health Dept. Aware to provide a copy of the vaccination record if obtained from local pharmacy or Health Dept. Verbalized acceptance and understanding.  Pneumococcal vaccine status: Up to date  Covid-19 vaccine status: Completed vaccines  Qualifies for Shingles Vaccine? Yes   Zostavax completed Yes   Shingrix Completed?: No.    Education has been provided regarding the importance of this vaccine. Patient has been advised to call insurance company to determine out of pocket expense if they have not yet received this vaccine. Advised may also receive vaccine at local pharmacy or Health Dept. Verbalized acceptance and understanding.  Screening Tests Health Maintenance  Topic Date Due   COVID-19 Vaccine (4 - Booster for Pfizer series) 12/16/2020   Zoster Vaccines- Shingrix (2 of 2) 12/16/2020   INFLUENZA VACCINE  12/09/2021   COLONOSCOPY (Pts 45-32yr Insurance coverage will need to be confirmed)  05/27/2022   DEXA SCAN  06/27/2023   TETANUS/TDAP  02/29/2028   Pneumonia Vaccine 76 Years old  Completed   Hepatitis C Screening  Completed   HPV VACCINES  Aged Out    Health Maintenance  Health Maintenance Due  Topic Date Due    COVID-19 Vaccine (4 - Booster for [employer]) 12/16/2020   Zoster Vaccines- Shingrix (2 of 2) 12/16/2020    Colorectal cancer screening: Type of screening: Colonoscopy. Completed 05/27/12. Repeat every 10 years  Mammogram status: No longer required due to age.  Bone Density status: Completed 06/26/21. Results reflect: Bone density results: OSTEOPOROSIS. Repeat every 2 years.  Lung Cancer Screening: (Low Dose CT Chest recommended if Age 76-80years, 30 pack-year currently smoking OR have quit w/in 15years.) does not qualify.     Additional Screening:  Hepatitis C Screening: does qualify; Completed 08/11/16  Vision Screening: Recommended annual ophthalmology exams for early detection of glaucoma and other disorders of the eye. Is the patient up to date with their annual eye exam?  Yes  Who is the provider or what is the name of the office in which the patient attends annual eye exams? Dr.Woodard If pt is not established with a provider, would they like to be referred to a provider to establish care? No .   Dental Screening: Recommended annual dental exams for proper oral hygiene  [employer]  Referral / Chronic Care Management: CRR required this visit?  No   CCM required this visit?  No      Plan:     I have personally reviewed and noted the following in the patient's chart:   Medical and social history Use of alcohol, tobacco or illicit drugs  Current medications and supplements including opioid prescriptions.  Functional ability and status Nutritional status Physical activity Advanced directives List of other physicians Hospitalizations, surgeries, and ER visits in previous 12 months Vitals Screenings to include cognitive, depression, and falls Referrals and appointments  In addition, I have reviewed and discussed with patient certain preventive protocols, quality metrics, and best practice recommendations. A written personalized care plan for preventive  services as well as general preventive health recommendations were provided to patient.     Hailey David, LPN   7/84/1282   Nurse Notes: none

## 2021-09-29 NOTE — Patient Instructions (Signed)
Hailey Cain , Thank you for taking time to come for your Medicare Wellness Visit. I appreciate your ongoing commitment to your health goals. Please review the following plan we discussed and let me know if I can assist you in the future.   Screening recommendations/referrals: Colonoscopy: 05/27/12 Mammogram: aged out Bone Density: 06/26/21 Recommended yearly ophthalmology/optometry visit for glaucoma screening and checkup Recommended yearly dental visit for hygiene and checkup  Vaccinations: Influenza vaccine: n.d Pneumococcal vaccine: 05/01/17 Tdap vaccine: 02/28/18 Shingles vaccine: Shingrix 10/21/20   Zostavax 10/27/14 Covid-19:07/07/19, 08/04/19, 10/21/20  Advanced directives: no  Conditions/risks identified: none  Next appointment: Follow up in one year for your annual wellness visit 10/01/22 @ 10:45am by phone   Preventive Care 65 Years and Older, Female Preventive care refers to lifestyle choices and visits with your health care provider that can promote health and wellness. What does preventive care include? A yearly physical exam. This is also called an annual well check. Dental exams once or twice a year. Routine eye exams. Ask your health care provider how often you should have your eyes checked. Personal lifestyle choices, including: Daily care of your teeth and gums. Regular physical activity. Eating a healthy diet. Avoiding tobacco and drug use. Limiting alcohol use. Practicing safe sex. Taking low-dose aspirin every day. Taking vitamin and mineral supplements as recommended by your health care provider. What happens during an annual well check? The services and screenings done by your health care provider during your annual well check will depend on your age, overall health, lifestyle risk factors, and family history of disease. Counseling  Your health care provider may ask you questions about your: Alcohol use. Tobacco use. Drug use. Emotional well-being. Home  and relationship well-being. Sexual activity. Eating habits. History of falls. Memory and ability to understand (cognition). Work and work Statistician. Reproductive health. Screening  You may have the following tests or measurements: Height, weight, and BMI. Blood pressure. Lipid and cholesterol levels. These may be checked every 5 years, or more frequently if you are over 57 years old. Skin check. Lung cancer screening. You may have this screening every year starting at age 62 if you have a 30-pack-year history of smoking and currently smoke or have quit within the past 15 years. Fecal occult blood test (FOBT) of the stool. You may have this test every year starting at age 73. Flexible sigmoidoscopy or colonoscopy. You may have a sigmoidoscopy every 5 years or a colonoscopy every 10 years starting at age 38. Hepatitis C blood test. Hepatitis B blood test. Sexually transmitted disease (STD) testing. Diabetes screening. This is done by checking your blood sugar (glucose) after you have not eaten for a while (fasting). You may have this done every 1-3 years. Bone density scan. This is done to screen for osteoporosis. You may have this done starting at age 58. Mammogram. This may be done every 1-2 years. Talk to your health care provider about how often you should have regular mammograms. Talk with your health care provider about your test results, treatment options, and if necessary, the need for more tests. Vaccines  Your health care provider may recommend certain vaccines, such as: Influenza vaccine. This is recommended every year. Tetanus, diphtheria, and acellular pertussis (Tdap, Td) vaccine. You may need a Td booster every 10 years. Zoster vaccine. You may need this after age 34. Pneumococcal 13-valent conjugate (PCV13) vaccine. One dose is recommended after age 63. Pneumococcal polysaccharide (PPSV23) vaccine. One dose is recommended after age 48. Talk to  your health care provider  about which screenings and vaccines you need and how often you need them. This information is not intended to replace advice given to you by your health care provider. Make sure you discuss any questions you have with your health care provider. Document Released: 05/24/2015 Document Revised: 01/15/2016 Document Reviewed: 02/26/2015 Elsevier Interactive Patient Education  2017 [place] Prevention in the Home Falls can cause injuries. They can happen to people of all ages. There are many things you can do to make your home safe and to help prevent falls. What can I do on the outside of my home? Regularly fix the edges of walkways and driveways and fix any cracks. Remove anything that might make you trip as you walk through a door, such as a raised step or threshold. Trim any bushes or trees on the path to your home. Use bright outdoor lighting. Clear any walking paths of anything that might make someone trip, such as rocks or tools. Regularly check to see if handrails are loose or broken. Make sure that both sides of any steps have handrails. Any raised decks and porches should have guardrails on the edges. Have any leaves, snow, or ice cleared regularly. Use sand or salt on walking paths during winter. Clean up any spills in your garage right away. This includes oil or grease spills. What can I do in the bathroom? Use night lights. Install grab bars by the toilet and in the tub and shower. Do not use towel bars as grab bars. Use non-skid mats or decals in the tub or shower. If you need to sit down in the shower, use a plastic, non-slip stool. Keep the floor dry. Clean up any water that spills on the floor as soon as it happens. Remove soap buildup in the tub or shower regularly. Attach bath mats securely with double-sided non-slip rug tape. Do not have throw rugs and other things on the floor that can make you trip. What can I do in the bedroom? Use night lights. Make sure  that you have a light by your bed that is easy to reach. Do not use any sheets or blankets that are too big for your bed. They should not hang down onto the floor. Have a firm chair that has side arms. You can use this for support while you get dressed. Do not have throw rugs and other things on the floor that can make you trip. What can I do in the kitchen? Clean up any spills right away. Avoid walking on wet floors. Keep items that you use a lot in easy-to-reach places. If you need to reach something above you, use a strong step stool that has a grab bar. Keep electrical cords out of the way. Do not use floor polish or wax that makes floors slippery. If you must use wax, use non-skid floor wax. Do not have throw rugs and other things on the floor that can make you trip. What can I do with my stairs? Do not leave any items on the stairs. Make sure that there are handrails on both sides of the stairs and use them. Fix handrails that are broken or loose. Make sure that handrails are as long as the stairways. Check any carpeting to make sure that it is firmly attached to the stairs. Fix any carpet that is loose or worn. Avoid having throw rugs at the top or bottom of the stairs. If you do have throw rugs, attach  them to the floor with carpet tape. Make sure that you have a light switch at the top of the stairs and the bottom of the stairs. If you do not have them, ask someone to add them for you. What else can I do to help prevent falls? Wear shoes that: Do not have high heels. Have rubber bottoms. Are comfortable and fit you well. Are closed at the toe. Do not wear sandals. If you use a stepladder: Make sure that it is fully opened. Do not climb a closed stepladder. Make sure that both sides of the stepladder are locked into place. Ask someone to hold it for you, if possible. Clearly mark and make sure that you can see: Any grab bars or handrails. First and last steps. Where the edge of  each step is. Use tools that help you move around (mobility aids) if they are needed. These include: Canes. Walkers. Scooters. Crutches. Turn on the lights when you go into a dark area. Replace any light bulbs as soon as they burn out. Set up your furniture so you have a clear path. Avoid moving your furniture around. If any of your floors are uneven, fix them. If there are any pets around you, be aware of where they are. Review your medicines with your doctor. Some medicines can make you feel dizzy. This can increase your chance of falling. Ask your doctor what other things that you can do to help prevent falls. This information is not intended to replace advice given to you by your health care provider. Make sure you discuss any questions you have with your health care provider. Document Released: 02/21/2009 Document Revised: 10/03/2015 Document Reviewed: 06/01/2014 Elsevier Interactive Patient Education  2017 [employer].

## 2021-10-14 ENCOUNTER — Other Ambulatory Visit: Payer: Self-pay | Admitting: Physician Assistant

## 2021-10-14 DIAGNOSIS — E039 Hypothyroidism, unspecified: Secondary | ICD-10-CM

## 2021-10-14 DIAGNOSIS — M810 Age-related osteoporosis without current pathological fracture: Secondary | ICD-10-CM

## 2021-11-12 ENCOUNTER — Encounter: Payer: Self-pay | Admitting: Physician Assistant

## 2021-11-12 ENCOUNTER — Ambulatory Visit (INDEPENDENT_AMBULATORY_CARE_PROVIDER_SITE_OTHER): Payer: Medicare HMO | Admitting: Physician Assistant

## 2021-11-12 VITALS — BP 121/61 | HR 61 | Ht 61.0 in | Wt 103.0 lb

## 2021-11-12 DIAGNOSIS — E039 Hypothyroidism, unspecified: Secondary | ICD-10-CM

## 2021-11-12 DIAGNOSIS — F339 Major depressive disorder, recurrent, unspecified: Secondary | ICD-10-CM | POA: Insufficient documentation

## 2021-11-12 DIAGNOSIS — E7800 Pure hypercholesterolemia, unspecified: Secondary | ICD-10-CM

## 2021-11-12 DIAGNOSIS — F321 Major depressive disorder, single episode, moderate: Secondary | ICD-10-CM | POA: Diagnosis not present

## 2021-11-12 DIAGNOSIS — L989 Disorder of the skin and subcutaneous tissue, unspecified: Secondary | ICD-10-CM | POA: Diagnosis not present

## 2021-11-12 DIAGNOSIS — M810 Age-related osteoporosis without current pathological fracture: Secondary | ICD-10-CM

## 2021-11-12 NOTE — Assessment & Plan Note (Signed)
Continue fosamax for 5 years; started 7/19 will d/c 7/24

## 2021-11-12 NOTE — Assessment & Plan Note (Addendum)
R lower eyelid very suspicious for bcc ; pt has history of multiple skin cancers Urgent referral derm

## 2021-11-12 NOTE — Assessment & Plan Note (Signed)
Managed with ASA 81 mg daily and atorvastatin 20 mg . Will check fasting lipids The 10-year ASCVD risk score (Arnett DK, et al., 2019) is: 14.3%

## 2021-11-12 NOTE — Assessment & Plan Note (Signed)
Did not check tsh/t4 last visit despite being ordered Reordered today Currently manages w/ levothyroxine 88 mcg

## 2021-11-12 NOTE — Assessment & Plan Note (Signed)
Moderately controlled with prozac 20 mg Pt admits to increased anxiety-- likely secondary to hearing loss and loss of one of her hearing aids. Encouraged she make an appt with her audiologist Continue medication

## 2021-11-12 NOTE — Progress Notes (Signed)
I,[name],acting as a Education administrator for [employer], PA-C.,have documented all relevant documentation on the behalf of Hailey Kirschner, PA-C,as directed by  Hailey Kirschner, PA-C while in the presence of Hailey Kirschner, PA-C.   Established patient visit   Patient: Hailey Cain   DOB: [birth year]   76 y.o. Female  MRN: [id number] Visit Date: 11/12/2021  Today's healthcare provider: [name], PA-C  Cc. Chronic care f/u  Subjective    HPI   Reports a new bump on her R eye that showed up a few weeks ago and has only grown. Might be a little sore but denies pain, erythema.   Lipid/Cholesterol, Follow-up  Last lipid panel Other pertinent labs  Lab Results  Component Value Date   CHOL 166 11/01/2020   HDL 68 11/01/2020   LDLCALC 83 11/01/2020   TRIG 82 11/01/2020   CHOLHDL 2.4 11/01/2020   Lab Results  Component Value Date   ALT 12 05/14/2021   AST 16 05/14/2021   PLT 209 11/01/2020   TSH 0.292 (L) 11/01/2020     She was last seen for this 1 years ago. (11/01/20) Management since that visit includes continue current medications. Managing with atorvastatin 20 mg.  She reports excellent compliance with treatment. She is not having side effects.   Symptoms: No chest pain No chest pressure/discomfort  No dyspnea No lower extremity edema  Yes numbness or tingling of extremity No orthopnea  No palpitations No paroxysmal nocturnal dyspnea  No speech difficulty No syncope   Current diet: well balanced Current exercise: weightlifting and stretching  The 10-year ASCVD risk score (Arnett DK, et al., 2019) is: 14.3%  --------------------------------------------------------------------------------------------------- Depression, Follow-up  She  was last seen for this 2 years ago. (10/10/19) Changes made at last visit include continue current medications. Managing with prozac 20 mg.    She reports excellent compliance with treatment. She is not having side  effects.   She reports excellent tolerance of treatment. Current symptoms include:  nervous/anxious She feels she is Unchanged since last visit.     09/29/2021   10:40 AM 05/14/2021    8:26 AM 11/01/2020   10:00 AM  Depression screen PHQ 2/9  Decreased Interest 0 0 0  Down, Depressed, Hopeless 0 0 0  PHQ - 2 Score 0 0 0  Altered sleeping   0  Tired, decreased energy   0  Change in appetite   0  Feeling bad or failure about yourself    0  Trouble concentrating   3  Moving slowly or fidgety/restless   0  Suicidal thoughts   0  PHQ-9 Score   3  Difficult doing work/chores   Not difficult at all    -----------------------------------------------------------------------------------------   Medications: Outpatient Medications Prior to Visit  Medication Sig   alendronate (FOSAMAX) 70 MG tablet Take 1 tablet (70 mg total) by mouth once a week. With a full glass of water   aspirin EC 325 MG tablet Take 325 mg by mouth daily.   atorvastatin (LIPITOR) 20 MG tablet Take 1 tablet (20 mg total) by mouth at bedtime.   Calcium Carbonate (CALCIUM 600 PO) Take 600 mg by mouth 2 (two) times a week.    Cholecalciferol (VITAMIN D-3) 25 MCG (1000 UT) CAPS Take 1,000 Units by mouth daily.   FLUoxetine (PROZAC) 20 MG capsule TAKE 2 CAPSULES (40 MG TOTAL) BY MOUTH ONCE DAILY   gabapentin (NEURONTIN) 100 MG capsule gabapentin 100 mg capsule  TAKE 1 CAPSULE  BY MOUTH THREE TIMES DAILY   levothyroxine (SYNTHROID) 88 MCG tablet TAKE 1 TABLET EVERY DAY BEFORE BREAKFAST   Multiple Vitamins-Minerals (EMERGEN-C IMMUNE PO) Take 1 tablet by mouth daily as needed (immunity boost).    omeprazole (PRILOSEC) 20 MG capsule TAKE 1 CAPSULE BY MOUTH DAILY. NEED MD APPOINTMENT FOR REFILLS   vitamin B-12 (CYANOCOBALAMIN) 500 MCG tablet Take 500 mcg by mouth daily.   chlorhexidine (PERIDEX) 0.12 % solution chlorhexidine gluconate 0.12 % mouthwash  RINSE MOUTH WITH 15MLS (1 CAPFUL) FOR 30 SECONDS MORNING AND EVENING AFTER  TOOTHBRUSHING. EXPECTORATE AFTER RINSING. DO NOT SWALLOW (Patient not taking: Reported on 09/29/2021)   HYDROcodone-acetaminophen (NORCO/VICODIN) 5-325 MG tablet hydrocodone 5 mg-acetaminophen 325 mg tablet  TAKE 1 TABLET BY MOUTH EVERY 6 HOURS AS NEEDED FOR PAIN (Patient not taking: Reported on 09/29/2021)   loratadine (CLARITIN) 10 MG tablet Take 1 tablet by mouth once daily (Patient not taking: Reported on 11/12/2021)   Polyethyl Glycol-Propyl Glycol (LUBRICANT EYE DROPS) 0.4-0.3 % SOLN Place 1 drop into both eyes 2 (two) times daily. (Patient not taking: Reported on 09/29/2021)   No facility-administered medications prior to visit.    Review of Systems  Constitutional:  Negative for fatigue and fever.  HENT:  Positive for hearing loss.   Respiratory:  Negative for cough and shortness of breath.   Cardiovascular:  Negative for chest pain and leg swelling.  Gastrointestinal:  Negative for abdominal pain.  Skin:  Positive for color change.  Neurological:  Negative for dizziness and headaches.       Objective    BP 121/61 (BP Location: Left Arm, Patient Position: Sitting, Cuff Size: Normal)   Pulse 61   Ht '5\' 1"'$  (1.549 m)   Wt 103 lb (46.7 kg)   SpO2 100%   BMI 19.46 kg/m   Physical Exam Constitutional:      General: She is awake.     Appearance: She is well-developed.  HENT:     Head: Normocephalic.  Eyes:     Conjunctiva/sclera: Conjunctivae normal.  Cardiovascular:     Rate and Rhythm: Normal rate and regular rhythm.     Heart sounds: Normal heart sounds.  Pulmonary:     Effort: Pulmonary effort is normal.     Breath sounds: Normal breath sounds.  Skin:    General: Skin is warm.     Comments: R eye lower eyelid w/ a 4 mm x 4 mm x 4 mm skin-colored papule w/ visible overlying blood vessel  Neurological:     Mental Status: She is alert and oriented to person, place, and time.  Psychiatric:        Attention and Perception: Attention normal.        Mood and Affect: Mood  normal.        Speech: Speech normal.        Behavior: Behavior is cooperative.     No results found for any visits on 11/12/21.  Assessment & Plan     Problem List Items Addressed This Visit       Endocrine   Adult hypothyroidism    Did not check tsh/t4 last visit despite being ordered Reordered today Currently manages w/ levothyroxine 88 mcg      Relevant Orders   TSH + free T4     Musculoskeletal and Integument   Osteoporosis    Continue fosamax for 5 years; started 7/19 will d/c 7/24      Relevant Orders   CBC w/Diff/Platelet   Skin  lesion    R lower eyelid very suspicious for bcc ; pt has history of multiple skin cancers Urgent referral derm      Relevant Orders   Ambulatory referral to Dermatology     Other   Hypercholesterolemia without hypertriglyceridemia    Managed with ASA 81 mg daily and atorvastatin 20 mg . Will check fasting lipids The 10-year ASCVD risk score (Arnett DK, et al., 2019) is: 14.3%       Relevant Orders   Lipid panel   Comprehensive Metabolic Panel (CMET)   Depression, major, single episode, moderate (HCC) - Primary    Moderately controlled with prozac 20 mg Pt admits to increased anxiety-- likely secondary to hearing loss and loss of one of her hearing aids. Encouraged she make an appt with her audiologist Continue medication      Relevant Orders   CBC w/Diff/Platelet   TSH + free T4   Vitamin B12    Return in about 6 months (around 05/15/2022) for CPE.      I, Hailey Kirschner, PA-C have reviewed all documentation for this visit. The documentation on  11/12/2021 for the exam, diagnosis, procedures, and orders are all accurate and complete.  Hailey Kirschner, PA-C [facility] [address] [place], [place], [address] Office: [phone] Fax: [place]

## 2021-11-13 LAB — COMPREHENSIVE METABOLIC PANEL
ALT: 12 IU/L (ref 0–32)
AST: 17 IU/L (ref 0–40)
Albumin/Globulin Ratio: 2 (ref 1.2–2.2)
Albumin: 4.2 g/dL (ref 3.7–4.7)
Alkaline Phosphatase: 41 IU/L — ABNORMAL LOW (ref 44–121)
BUN/Creatinine Ratio: 22 (ref 12–28)
BUN: 14 mg/dL (ref 8–27)
Bilirubin Total: 0.3 mg/dL (ref 0.0–1.2)
CO2: 23 mmol/L (ref 20–29)
Calcium: 9.2 mg/dL (ref 8.7–10.3)
Chloride: 104 mmol/L (ref 96–106)
Creatinine, Ser: 0.65 mg/dL (ref 0.57–1.00)
Globulin, Total: 2.1 g/dL (ref 1.5–4.5)
Glucose: 84 mg/dL (ref 70–99)
Potassium: 4.2 mmol/L (ref 3.5–5.2)
Sodium: 141 mmol/L (ref 134–144)
Total Protein: 6.3 g/dL (ref 6.0–8.5)
eGFR: 92 mL/min/{1.73_m2} (ref >59)

## 2021-11-13 LAB — CBC WITH DIFFERENTIAL/PLATELET
Basophils Absolute: 0 10*3/uL (ref 0.0–0.2)
Basos: 1 %
EOS (ABSOLUTE): 0.1 10*3/uL (ref 0.0–0.4)
Eos: 2 %
Hematocrit: 39.5 % (ref 34.0–46.6)
Hemoglobin: 12.9 g/dL (ref 11.1–15.9)
Immature Grans (Abs): 0 10*3/uL (ref 0.0–0.1)
Immature Granulocytes: 0 %
Lymphocytes Absolute: 1.7 10*3/uL (ref 0.7–3.1)
Lymphs: 32 %
MCH: 31.3 pg (ref 26.6–33.0)
MCHC: 32.7 g/dL (ref 31.5–35.7)
MCV: 96 fL (ref 79–97)
Monocytes Absolute: 0.5 10*3/uL (ref 0.1–0.9)
Monocytes: 9 %
Neutrophils Absolute: 2.9 10*3/uL (ref 1.4–7.0)
Neutrophils: 56 %
Platelets: 253 10*3/uL (ref 150–450)
RBC: 4.12 x10E6/uL (ref 3.77–5.28)
RDW: 12.7 % (ref 11.7–15.4)
WBC: 5.2 10*3/uL (ref 3.4–10.8)

## 2021-11-13 LAB — LIPID PANEL
Chol/HDL Ratio: 2.8 ratio (ref 0.0–4.4)
Cholesterol, Total: 174 mg/dL (ref 100–199)
HDL: 62 mg/dL (ref >39)
LDL Chol Calc (NIH): 90 mg/dL (ref 0–99)
Triglycerides: 125 mg/dL (ref 0–149)
VLDL Cholesterol Cal: 22 mg/dL (ref 5–40)

## 2021-11-13 LAB — TSH+FREE T4
Free T4: 1.27 ng/dL (ref 0.82–1.77)
TSH: 0.514 u[IU]/mL (ref 0.450–4.500)

## 2021-11-13 LAB — VITAMIN B12: Vitamin B-12: 685 pg/mL (ref 232–1245)

## 2021-11-20 ENCOUNTER — Ambulatory Visit: Payer: Medicare HMO | Admitting: Dermatology

## 2021-11-20 ENCOUNTER — Encounter: Payer: Self-pay | Admitting: Dermatology

## 2021-11-20 DIAGNOSIS — L720 Epidermal cyst: Secondary | ICD-10-CM

## 2021-11-20 DIAGNOSIS — Z85828 Personal history of other malignant neoplasm of skin: Secondary | ICD-10-CM | POA: Diagnosis not present

## 2021-11-20 DIAGNOSIS — S50812A Abrasion of left forearm, initial encounter: Secondary | ICD-10-CM

## 2021-11-20 DIAGNOSIS — L578 Other skin changes due to chronic exposure to nonionizing radiation: Secondary | ICD-10-CM | POA: Diagnosis not present

## 2021-11-20 DIAGNOSIS — T148XXA Other injury of unspecified body region, initial encounter: Secondary | ICD-10-CM

## 2021-11-20 DIAGNOSIS — H0012 Chalazion right lower eyelid: Secondary | ICD-10-CM | POA: Diagnosis not present

## 2021-11-20 DIAGNOSIS — L570 Actinic keratosis: Secondary | ICD-10-CM | POA: Diagnosis not present

## 2021-11-20 MED ORDER — DOXYCYCLINE HYCLATE 100 MG PO CAPS: 100.0000 mg | ORAL_CAPSULE | Freq: Two times a day (BID) | ORAL | 0 refills | Status: AC

## 2021-11-20 NOTE — Progress Notes (Signed)
New Patient Visit  Subjective  Hailey Cain is a 76 y.o. female who presents for the following: lesion (Dur: 3 weeks. Right lower eyelid. Has been sore. Patient used warm compresses, thought was a stye ).  The patient has spots, moles and lesions to be evaluated, some may be new or changing and the patient has concerns that these could be cancer.   Review of Systems: No other skin or systemic complaints except as noted in HPI or Assessment and Plan.   Objective  Well appearing patient in no apparent distress; mood and affect are within normal limits.  A focused examination was performed including face. Relevant physical exam findings are noted in the Assessment and Plan.  Right Lower Eyelid Smooth skin colored papule     Left Forearm - Posterior excoriations  Left Chin x1, right cheek x1 (2) Erythematous thin papules/macules with gritty scale.   Right Lateral Canthus Firm white subcutaneous papule   Assessment & Plan  Chalazion of right lower eyelid Right Lower Eyelid  Continue warm compresses, increasing duration and frequency as directed.   Doxycycline 100 mg 1 capsule twice daily for 1 week.  Doxycycline should be taken with food to prevent nausea. Do not lay down for 30 minutes after taking. Be cautious with sun exposure and use good sun protection while on this medication. Pregnant women should not take this medication.   Will send referral to [facility].   Recommend lid scrub such as Ocusoft or washing eyelids/lashes with baby shampoo daily to prevent recurrences   doxycycline (VIBRAMYCIN) 100 MG capsule - Right Lower Eyelid Take 1 capsule (100 mg total) by mouth 2 (two) times daily for 7 days. Take with food.  Related Procedures Ambulatory referral to Ophthalmology  Excoriation Left Forearm - Posterior  Observe   AK (actinic keratosis) (2) Left Chin x1, right cheek x1  Actinic keratoses are precancerous spots that appear  secondary to cumulative UV radiation exposure/sun exposure over time. They are chronic with expected duration over 1 year. A portion of actinic keratoses will progress to squamous cell carcinoma of the skin. It is not possible to reliably predict which spots will progress to skin cancer and so treatment is recommended to prevent development of skin cancer.  Recommend daily broad spectrum sunscreen SPF 30+ to sun-exposed areas, reapply every 2 hours as needed.  Recommend staying in the shade or wearing long sleeves, sun glasses (UVA+UVB protection) and wide brim hats (4-inch brim around the entire circumference of the hat). Call for new or changing lesions.  Discussed treatment options: LN2, topical field treatment. Patient prefers LN2.  Destruction of lesion - Left Chin x1, right cheek x1  Destruction method: cryotherapy   Informed consent: discussed and consent obtained   Lesion destroyed using liquid nitrogen: Yes   Outcome: patient tolerated procedure well with no complications   Post-procedure details: wound care instructions given   Additional details:  Prior to procedure, discussed risks of blister formation, small wound, skin dyspigmentation, or rare scar following cryotherapy. Recommend Vaseline ointment to treated areas while healing.   Epidermal cyst Right Lateral Canthus  Benign-appearing.  Observation.  Call clinic for new or changing lesions.    History of Basal Cell Carcinoma of the Skin. Left nose. - No evidence of recurrence today - Recommend regular full body skin exams - Recommend daily broad spectrum sunscreen SPF 30+ to sun-exposed areas, reapply every 2 hours as needed.  - Call if any new or changing lesions are  noted between office visits   Actinic Damage - chronic, secondary to cumulative UV radiation exposure/sun exposure over time - diffuse scaly erythematous macules with underlying dyspigmentation - Recommend daily broad spectrum sunscreen SPF 30+ to  sun-exposed areas, reapply every 2 hours as needed.  - Recommend staying in the shade or wearing long sleeves, sun glasses (UVA+UVB protection) and wide brim hats (4-inch brim around the entire circumference of the hat). - Call for new or changing lesions.   Return for TBSE next available .  I, Emelia Salisbury, CMA, am acting as scribe for Forest Gleason, MD.  Documentation: I have reviewed the above documentation for accuracy and completeness, and I agree with the above.  Forest Gleason, MD

## 2021-11-20 NOTE — Patient Instructions (Addendum)
Continue warm compresses as directed.   Doxycycline 100 mg 1 capsule twice daily for 1 week.  Doxycycline should be taken with food to prevent nausea. Do not lay down for 30 minutes after taking. Be cautious with sun exposure and use good sun protection while on this medication. Pregnant women should not take this medication.    Referral will be sent to [facility] for evaluation and treatment of chalazion.   Recommend gentle eye wash: Ocusoft lid scrub, Johnson's Baby Shampoo.  Cryotherapy Aftercare  Wash gently with soap and water everyday.   Apply Vaseline daily until healed.   Recommend daily broad spectrum sunscreen SPF 30+ to sun-exposed areas, reapply every 2 hours as needed. Call for new or changing lesions.  Staying in the shade or wearing long sleeves, sun glasses (UVA+UVB protection) and wide brim hats (4-inch brim around the entire circumference of the hat) are also recommended for sun protection.   Recommend taking Heliocare sun protection supplement daily in sunny weather for additional sun protection. For maximum protection on the sunniest days, you can take up to 2 capsules of regular Heliocare OR take 1 capsule of Heliocare Ultra. For prolonged exposure (such as a full day in the sun), you can repeat your dose of the supplement 4 hours after your first dose. Heliocare can be purchased at [employer], at some [facility] or at [email or web address].     Due to recent changes in healthcare laws, you may see results of your pathology and/or laboratory studies on MyChart before the doctors have had a chance to review them. We understand that in some cases there may be results that are confusing or concerning to you. Please understand that not all results are received at the same time and often the doctors may need to interpret multiple results in order to provide you with the best plan of care or course of treatment. Therefore, we ask that you please give [place] 2 business  days to thoroughly review all your results before contacting the office for clarification. Should we see a critical lab result, you will be contacted sooner.   If You Need Anything After Your Visit  If you have any questions or concerns for your doctor, please call our main line at [phone] and press option 4 to reach your doctor's medical assistant. If no one answers, please leave a voicemail as directed and we will return your call as soon as possible. Messages left after 4 pm will be answered the following business day.   You may also send [place] a message via [place]. We typically respond to MyChart messages within 1-2 business days.  For prescription refills, please ask your pharmacy to contact our office. Our fax number is [phone].  If you have an urgent issue when the clinic is closed that cannot wait until the next business day, you can page your doctor at the number below.    Please note that while we do our best to be available for urgent issues outside of office hours, we are not available 24/7.   If you have an urgent issue and are unable to reach [place], you may choose to seek medical care at your doctor's office, retail clinic, urgent care center, or emergency room.  If you have a medical emergency, please immediately call 911 or go to the emergency department.  Pager Numbers  - Dr. Nehemiah Massed: [phone]  - Dr. Laurence Ferrari: [phone]  - Dr. Nicole Kindred: [phone]  In the event of inclement weather, please  call our main line at [phone] for an update on the status of any delays or closures.  Dermatology Medication Tips: Please keep the boxes that topical medications come in in order to help keep track of the instructions about where and how to use these. Pharmacies typically print the medication instructions only on the boxes and not directly on the medication tubes.   If your medication is too expensive, please contact our office at [phone] option 4 or send [place] a  message through [place].   We are unable to tell what your co-pay for medications will be in advance as this is different depending on your insurance coverage. However, we may be able to find a substitute medication at lower cost or fill out paperwork to get insurance to cover a needed medication.   If a prior authorization is required to get your medication covered by your insurance company, please allow [place] 1-2 business days to complete this process.  Drug prices often vary depending on where the prescription is filled and some pharmacies may offer cheaper prices.  The website www.goodrx.com contains coupons for medications through different pharmacies. The prices here do not account for what the cost may be with help from insurance (it may be cheaper with your insurance), but the website can give you the price if you did not use any insurance.  - You can print the associated coupon and take it with your prescription to the pharmacy.  - You may also stop by our office during regular business hours and pick up a GoodRx coupon card.  - If you need your prescription sent electronically to a different pharmacy, notify our office through [facility] or by phone at [phone] option 4.     Si Usted Necesita Algo Despus de Su Visita  Tambin puede enviarnos un mensaje a travs de Pharmacist, community. Por lo general respondemos a los mensajes de MyChart en el transcurso de 1 a 2 das hbiles.  Para renovar recetas, por favor pida a su farmacia que se ponga en contacto con nuestra oficina. Harland Dingwall de fax es [place] [phone].  Si tiene un asunto urgente cuando la clnica est cerrada y que no puede esperar hasta el siguiente da hbil, puede llamar/localizar a su doctor(a) al nmero que aparece a continuacin.   Por favor, tenga en cuenta que aunque hacemos todo lo posible para estar disponibles para asuntos urgentes fuera del horario de [place], no estamos disponibles las 24 horas del da, los 7  das de la [place].   Si tiene un problema urgente y no puede comunicarse con nosotros, puede optar por buscar atencin mdica  en el consultorio de su doctor(a), en una clnica privada, en un centro de atencin urgente o en una sala de emergencias.  Si tiene Engineering geologist, por favor llame inmediatamente al 911 o vaya a la sala de emergencias.  Nmeros de bper  - Dr. Nehemiah Massed: [phone]  - Dra. Moye: [phone]  - Dra. Nicole Kindred: [phone]  En caso de inclemencias del [place], por favor llame a Johnsie Kindred principal al [phone] para una actualizacin sobre el [place] de cualquier retraso o cierre.  Consejos para la medicacin en dermatologa: Por favor, guarde las cajas en las que vienen los medicamentos de uso tpico para ayudarle a seguir las instrucciones sobre dnde y cmo usarlos. Las farmacias generalmente imprimen las instrucciones del medicamento slo en las cajas y no directamente en los tubos del [place].   Si su medicamento es [employer], por  favor, pngase en contacto con nuestra oficina llamando al [phone] y presione la opcin 4 o envenos un mensaje a travs de Pharmacist, community.   No podemos decirle cul ser su copago por los medicamentos por adelantado ya que esto es diferente dependiendo de la cobertura de su seguro. Sin embargo, es posible que podamos encontrar un medicamento sustituto a Electrical engineer un formulario para que el seguro cubra el medicamento que se considera necesario.   Si se requiere una autorizacin previa para que su compaa de seguros [place] su medicamento, por favor permtanos de 1 a 2 das hbiles para completar este proceso.  Los precios de los medicamentos varan con frecuencia dependiendo del Environmental consultant de dnde se surte la receta y alguna farmacias pueden ofrecer precios ms baratos.  El sitio web www.goodrx.com tiene cupones para medicamentos de Airline pilot. Los precios aqu no tienen en cuenta lo que podra costar con  la ayuda del seguro (puede ser ms barato con su seguro), pero el sitio web puede darle el precio si no utiliz Research scientist (physical sciences).  - Puede imprimir el cupn correspondiente y llevarlo con su receta a la farmacia.  - Tambin puede pasar por nuestra oficina durante el horario de atencin regular y Charity fundraiser una tarjeta de cupones de GoodRx.  - Si necesita que su receta se enve electrnicamente a una farmacia diferente, informe a nuestra oficina a travs de MyChart de [place] o por telfono llamando al [phone] y presione la opcin 4.

## 2021-11-24 DIAGNOSIS — H0012 Chalazion right lower eyelid: Secondary | ICD-10-CM | POA: Diagnosis not present

## 2021-11-30 ENCOUNTER — Other Ambulatory Visit: Payer: Self-pay | Admitting: Physician Assistant

## 2021-12-16 ENCOUNTER — Ambulatory Visit (INDEPENDENT_AMBULATORY_CARE_PROVIDER_SITE_OTHER): Payer: Medicare HMO | Admitting: Dermatology

## 2021-12-16 ENCOUNTER — Encounter: Payer: Self-pay | Admitting: Dermatology

## 2021-12-16 DIAGNOSIS — L570 Actinic keratosis: Secondary | ICD-10-CM | POA: Diagnosis not present

## 2021-12-16 DIAGNOSIS — L820 Inflamed seborrheic keratosis: Secondary | ICD-10-CM

## 2021-12-16 DIAGNOSIS — D239 Other benign neoplasm of skin, unspecified: Secondary | ICD-10-CM

## 2021-12-16 DIAGNOSIS — D229 Melanocytic nevi, unspecified: Secondary | ICD-10-CM | POA: Diagnosis not present

## 2021-12-16 DIAGNOSIS — Z1283 Encounter for screening for malignant neoplasm of skin: Secondary | ICD-10-CM | POA: Diagnosis not present

## 2021-12-16 DIAGNOSIS — L814 Other melanin hyperpigmentation: Secondary | ICD-10-CM | POA: Diagnosis not present

## 2021-12-16 DIAGNOSIS — Z85828 Personal history of other malignant neoplasm of skin: Secondary | ICD-10-CM

## 2021-12-16 DIAGNOSIS — L821 Other seborrheic keratosis: Secondary | ICD-10-CM | POA: Diagnosis not present

## 2021-12-16 DIAGNOSIS — D492 Neoplasm of unspecified behavior of bone, soft tissue, and skin: Secondary | ICD-10-CM

## 2021-12-16 DIAGNOSIS — L578 Other skin changes due to chronic exposure to nonionizing radiation: Secondary | ICD-10-CM

## 2021-12-16 DIAGNOSIS — D225 Melanocytic nevi of trunk: Secondary | ICD-10-CM | POA: Diagnosis not present

## 2021-12-16 DIAGNOSIS — D1800 Hemangioma unspecified site: Secondary | ICD-10-CM

## 2021-12-16 HISTORY — DX: Other benign neoplasm of skin, unspecified: D23.9

## 2021-12-16 NOTE — Patient Instructions (Addendum)
Cryotherapy Aftercare  Wash gently with soap and water everyday.   Apply Vaseline and Band-Aid daily until healed.   Recommend OTC [place] Rapid Relief Anti-Itch cream (pramoxine + menthol), CeraVe Anti-itch cream or lotion (pramoxine), Sarna lotion (Original- menthol + camphor or Sensitive- pramoxine) or Eucerin 12 hour Itch Relief lotion (menthol) up to 3 times per day to areas on body that are itchy.  Wound Care Instructions  Cleanse wound gently with soap and water once a day then pat dry with clean gauze. Apply a thin coat of Petrolatum (petroleum jelly, "Vaseline") over the wound (unless you have an allergy to this). We recommend that you use a new, sterile tube of Vaseline. Do not pick or remove scabs. Do not remove the yellow or white "healing tissue" from the base of the wound.  Cover the wound with fresh, clean, nonstick gauze and secure with paper tape. You may use Band-Aids in place of gauze and tape if the wound is small enough, but would recommend trimming much of the tape off as there is often too much. Sometimes Band-Aids can irritate the skin.  You should call the office for your biopsy report after 1 week if you have not already been contacted.  If you experience any problems, such as abnormal amounts of bleeding, swelling, significant bruising, significant pain, or evidence of infection, please call the office immediately.  FOR ADULT SURGERY PATIENTS: If you need something for pain relief you may take 1 extra strength Tylenol (acetaminophen) AND 2 Ibuprofen ('200mg'$  each) together every 4 hours as needed for pain. (do not take these if you are allergic to them or if you have a reason you should not take them.) Typically, you may only need pain medication for 1 to 3 days.   Recommend taking Heliocare sun protection supplement daily in sunny weather for additional sun protection. For maximum protection on the sunniest days, you can take up to 2 capsules of regular Heliocare OR  take 1 capsule of Heliocare Ultra. For prolonged exposure (such as a full day in the sun), you can repeat your dose of the supplement 4 hours after your first dose. Heliocare can be purchased at [employer], at some [facility] or at [email or web address].  Recommend taking Heliocare sun protection supplement daily in sunny weather for additional sun protection. For maximum protection on the sunniest days, you can take up to 2 capsules of regular Heliocare OR take 1 capsule of Heliocare Ultra. For prolonged exposure (such as a full day in the sun), you can repeat your dose of the supplement 4 hours after your first dose. Heliocare can be purchased at [employer], at some [facility] or at [email or web address].    Melanoma ABCDEs  Melanoma is the most dangerous type of skin cancer, and is the leading cause of death from skin disease.  You are more likely to develop melanoma if you: Have light-colored skin, light-colored eyes, or red or blond hair Spend a lot of time in the sun Tan regularly, either outdoors or in a tanning bed Have had blistering sunburns, especially during childhood Have a close family member who has had a melanoma Have atypical moles or large birthmarks  Early detection of melanoma is key since treatment is typically straightforward and cure rates are extremely high if we catch it early.   The first sign of melanoma is often a change in a mole or a new dark spot.  The ABCDE system is a way of remembering the signs of  melanoma.  A for asymmetry:  The two halves do not match. B for border:  The edges of the growth are irregular. C for color:  A mixture of colors are present instead of an even brown color. D for diameter:  Melanomas are usually (but not always) greater than 58m - the size of a pencil eraser. E for evolution:  The spot keeps changing in size, shape, and color.  Please check your skin once per month between visits. You can use a small mirror in front and a  large mirror behind you to keep an eye on the back side or your body.   If you see any new or changing lesions before your next follow-up, please call to schedule a visit.  Please continue daily skin protection including broad spectrum sunscreen SPF 30+ to sun-exposed areas, reapplying every 2 hours as needed when you're outdoors.  Melanoma ABCDEs  Melanoma is the most dangerous type of skin cancer, and is the leading cause of death from skin disease.  You are more likely to develop melanoma if you: Have light-colored skin, light-colored eyes, or red or blond hair Spend a lot of time in the sun Tan regularly, either outdoors or in a tanning bed Have had blistering sunburns, especially during childhood Have a close family member who has had a melanoma Have atypical moles or large birthmarks  Early detection of melanoma is key since treatment is typically straightforward and cure rates are extremely high if we catch it early.   The first sign of melanoma is often a change in a mole or a new dark spot.  The ABCDE system is a way of remembering the signs of melanoma.  A for asymmetry:  The two halves do not match. B for border:  The edges of the growth are irregular. C for color:  A mixture of colors are present instead of an even brown color. D for diameter:  Melanomas are usually (but not always) greater than 665m- the size of a pencil eraser. E for evolution:  The spot keeps changing in size, shape, and color.  Please check your skin once per month between visits. You can use a small mirror in front and a large mirror behind you to keep an eye on the back side or your body.   If you see any new or changing lesions before your next follow-up, please call to schedule a visit.  Please continue daily skin protection including broad spectrum sunscreen SPF 30+ to sun-exposed areas, reapplying every 2 hours as needed when you're outdoors.    Due to recent changes in healthcare laws, you may see  results of your pathology and/or laboratory studies on MyChart before the doctors have had a chance to review them. We understand that in some cases there may be results that are confusing or concerning to you. Please understand that not all results are received at the same time and often the doctors may need to interpret multiple results in order to provide you with the best plan of care or course of treatment. Therefore, we ask that you please give [place] business days to thoroughly review all your results before contacting the office for clarification. Should we see a critical lab result, you will be contacted sooner.   If You Need Anything After Your Visit  If you have any questions or concerns for your doctor, please call our main line at [phone] press option 4 to reach your doctor's medical assistant. If  no one answers, please leave a voicemail as directed and we will return your call as soon as possible. Messages left after 4 pm will be answered the following business day.   You may also send [place] a message via [place]. We typically respond to MyChart messages within 1-2 business days.  For prescription refills, please ask your pharmacy to contact our office. Our fax number is [phone].  If you have an urgent issue when the clinic is closed that cannot wait until the next business day, you can page your doctor at the number below.    Please note that while we do our best to be available for urgent issues outside of office hours, we are not available 24/7.   If you have an urgent issue and are unable to reach [place], you may choose to seek medical care at your doctor's office, retail clinic, urgent care center, or emergency room.  If you have a medical emergency, please immediately call 911 or go to the emergency department.  Pager Numbers  - Dr. Nehemiah Massed: [phone]  - Dr. Laurence Ferrari: [phone]  - Dr. Nicole Kindred: [phone]  In the event of inclement weather, please call our main  line at [phone] for an update on the status of any delays or closures.  Dermatology Medication Tips: Please keep the boxes that topical medications come in in order to help keep track of the instructions about where and how to use these. Pharmacies typically print the medication instructions only on the boxes and not directly on the medication tubes.   If your medication is too expensive, please contact our office at [phone] option 4 or send [place] a message through [place].   We are unable to tell what your co-pay for medications will be in advance as this is different depending on your insurance coverage. However, we may be able to find a substitute medication at lower cost or fill out paperwork to get insurance to cover a needed medication.   If a prior authorization is required to get your medication covered by your insurance company, please allow [place] 1-2 business days to complete this process.  Drug prices often vary depending on where the prescription is filled and some pharmacies may offer cheaper prices.  The website www.goodrx.com contains coupons for medications through different pharmacies. The prices here do not account for what the cost may be with help from insurance (it may be cheaper with your insurance), but the website can give you the price if you did not use any insurance.  - You can print the associated coupon and take it with your prescription to the pharmacy.  - You may also stop by our office during regular business hours and pick up a GoodRx coupon card.  - If you need your prescription sent electronically to a different pharmacy, notify our office through [facility] or by phone at [phone] option 4.     Si Usted Necesita Algo Despus de Su Visita  Tambin puede enviarnos un mensaje a travs de Pharmacist, community. Por lo general respondemos a los mensajes de MyChart en el transcurso de 1 a 2 das hbiles.  Para renovar recetas, por favor pida a su farmacia que  se ponga en contacto con nuestra oficina. Harland Dingwall de fax es [place] [phone].  Si tiene un asunto urgente cuando la clnica est cerrada y que no puede esperar hasta el siguiente da hbil, puede llamar/localizar a su doctor(a) al nmero que aparece a continuacin.   Por favor, tenga en  cuenta que aunque hacemos todo lo posible para estar disponibles para asuntos urgentes fuera del horario de [place], no estamos disponibles las 24 horas del da, los 7 das de la [place].   Si tiene un problema urgente y no puede comunicarse con nosotros, puede optar por buscar atencin mdica  en el consultorio de su doctor(a), en una clnica privada, en un centro de atencin urgente o en una sala de emergencias.  Si tiene Engineering geologist, por favor llame inmediatamente al 911 o vaya a la sala de emergencias.  Nmeros de bper  - Dr. Nehemiah Massed: [phone]  - Dra. Moye: [phone]  - Dra. Nicole Kindred: [phone]  En caso de inclemencias del [place], por favor llame a Johnsie Kindred principal al [phone] para una actualizacin sobre el [place] de cualquier retraso o cierre.  Consejos para la medicacin en dermatologa: Por favor, guarde las cajas en las que vienen los medicamentos de uso tpico para ayudarle a seguir las instrucciones sobre dnde y cmo usarlos. Las farmacias generalmente imprimen las instrucciones del medicamento slo en las cajas y no directamente en los tubos del [place].   Si su medicamento es muy caro, por favor, pngase en contacto con Zigmund Daniel llamando al [phone] y presione la opcin 4 o envenos un mensaje a travs de Pharmacist, community.   No podemos decirle cul ser su copago por los medicamentos por adelantado ya que esto es diferente dependiendo de la cobertura de su seguro. Sin embargo, es posible que podamos encontrar un medicamento sustituto a Electrical engineer un formulario para que el seguro cubra el medicamento que se considera necesario.   Si se  requiere una autorizacin previa para que su compaa de seguros [place] su medicamento, por favor permtanos de 1 a 2 das hbiles para completar este proceso.  Los precios de los medicamentos varan con frecuencia dependiendo del Environmental consultant de dnde se surte la receta y alguna farmacias pueden ofrecer precios ms baratos.  El sitio web www.goodrx.com tiene cupones para medicamentos de Airline pilot. Los precios aqu no tienen en cuenta lo que podra costar con la ayuda del seguro (puede ser ms barato con su seguro), pero el sitio web puede darle el precio si no utiliz Research scientist (physical sciences).  - Puede imprimir el cupn correspondiente y llevarlo con su receta a la farmacia.  - Tambin puede pasar por nuestra oficina durante el horario de atencin regular y Charity fundraiser una tarjeta de cupones de GoodRx.  - Si necesita que su receta se enve electrnicamente a una farmacia diferente, informe a nuestra oficina a travs de MyChart de [place] o por telfono llamando al [phone] y presione la opcin 4.

## 2021-12-16 NOTE — Progress Notes (Signed)
Follow-Up Visit   Subjective  Hailey Cain is a 76 y.o. female who presents for the following: Annual Exam (The patient presents for Total-Body Skin Exam (TBSE) for skin cancer screening and mole check.  The patient has spots, moles and lesions to be evaluated, some may be new or changing and the patient has concerns that these could be cancer. Patient with hx of BCC. ).  Patient does have a few places at back that have been itching.   The following portions of the chart were reviewed this encounter and updated as appropriate:   Tobacco  Allergies  Meds  Problems  Med Hx  Surg Hx  Fam Hx      Review of Systems:  No other skin or systemic complaints except as noted in HPI or Assessment and Plan.  Objective  Well appearing patient in no apparent distress; mood and affect are within normal limits.  A full examination was performed including scalp, head, eyes, ears, nose, lips, neck, chest, axillae, abdomen, back, buttocks, bilateral upper extremities, bilateral lower extremities, hands, feet, fingers, toes, fingernails, and toenails. All findings within normal limits unless otherwise noted below.  right calf x 1, left dorsal hand x 1, left forearm x 2, left upper arm x 3, right ant shoulder x 1, right cheek x 1, nose x 1 (10) Erythematous thin papules/macules with gritty scale.   left superior shoulder x 1, left lower back x 2, left upper back x 1 (4) Erythematous stuck-on, waxy papule or plaque  left lower chest 0.5 cm irregular dark brown thin papule       Assessment & Plan  AK (actinic keratosis) (10) right calf x 1, left dorsal hand x 1, left forearm x 2, left upper arm x 3, right ant shoulder x 1, right cheek x 1, nose x 1  Actinic keratoses are precancerous spots that appear secondary to cumulative UV radiation exposure/sun exposure over time. They are chronic with expected duration over 1 year. A portion of actinic keratoses will progress to squamous cell  carcinoma of the skin. It is not possible to reliably predict which spots will progress to skin cancer and so treatment is recommended to prevent development of skin cancer.  Recommend daily broad spectrum sunscreen SPF 30+ to sun-exposed areas, reapply every 2 hours as needed.  Recommend staying in the shade or wearing long sleeves, sun glasses (UVA+UVB protection) and wide brim hats (4-inch brim around the entire circumference of the hat). Call for new or changing lesions.  Prior to procedure, discussed risks of blister formation, small wound, skin dyspigmentation, or rare scar following cryotherapy. Recommend Vaseline ointment to treated areas while healing.   Destruction of lesion - right calf x 1, left dorsal hand x 1, left forearm x 2, left upper arm x 3, right ant shoulder x 1, right cheek x 1, nose x 1  Destruction method: cryotherapy   Informed consent: discussed and consent obtained   Lesion destroyed using liquid nitrogen: Yes   Cryotherapy cycles:  2 Outcome: patient tolerated procedure well with no complications   Post-procedure details: wound care instructions given    Inflamed seborrheic keratosis (4) left superior shoulder x 1, left lower back x 2, left upper back x 1  Symptomatic, irritating, patient would like treated.  Prior to procedure, discussed risks of blister formation, small wound, skin dyspigmentation, or rare scar following cryotherapy. Recommend Vaseline ointment to treated areas while healing.  Recommend OTC Gold Bond Rapid Relief Anti-Itch cream (  pramoxine + menthol), CeraVe Anti-itch cream or lotion (pramoxine), Sarna lotion (Original- menthol + camphor or Sensitive- pramoxine) or Eucerin 12 hour Itch Relief lotion (menthol) up to 3 times per day to areas on body that are itchy.    Destruction of lesion - left superior shoulder x 1, left lower back x 2, left upper back x 1  Destruction method: cryotherapy   Informed consent: discussed and consent  obtained   Lesion destroyed using liquid nitrogen: Yes   Cryotherapy cycles:  2 Outcome: patient tolerated procedure well with no complications   Post-procedure details: wound care instructions given    Neoplasm of skin left lower chest  Epidermal / dermal shaving  Lesion diameter (cm):  0.5 Informed consent: discussed and consent obtained   Timeout: patient name, date of birth, surgical site, and procedure verified   Anesthesia: the lesion was anesthetized in a standard fashion   Anesthetic:  1% lidocaine w/ epinephrine 1-100,000 local infiltration Instrument used: flexible razor blade   Hemostasis achieved with: aluminum chloride   Outcome: patient tolerated procedure well   Post-procedure details: wound care instructions given   Additional details:  Mupirocin and a bandage applied  Specimen 1 - Surgical pathology Differential Diagnosis: r/o Atypia  Check Margins: No 0.5 cm irregular dark brown thin papule   History of Basal Cell Carcinoma of the Skin - No evidence of recurrence today - Recommend regular full body skin exams - Recommend daily broad spectrum sunscreen SPF 30+ to sun-exposed areas, reapply every 2 hours as needed.  - Call if any new or changing lesions are noted between office visits  Lentigines - Scattered tan macules - Due to sun exposure - Benign-appearing, observe - Recommend daily broad spectrum sunscreen SPF 30+ to sun-exposed areas, reapply every 2 hours as needed. - Call for any changes  Seborrheic Keratoses - Stuck-on, waxy, tan-brown papules and/or plaques  - Benign-appearing - Discussed benign etiology and prognosis. - Observe - Call for any changes  Melanocytic Nevi - Tan-brown and/or pink-flesh-colored symmetric macules and papules - Benign appearing on exam today - Observation - Call clinic for new or changing moles - Recommend daily use of broad spectrum spf 30+ sunscreen to sun-exposed areas.   Hemangiomas - Red papules -  Discussed benign nature - Observe - Call for any changes  Actinic Damage - Chronic condition, secondary to cumulative UV/sun exposure - diffuse scaly erythematous macules with underlying dyspigmentation - Recommend daily broad spectrum sunscreen SPF 30+ to sun-exposed areas, reapply every 2 hours as needed.  - Staying in the shade or wearing long sleeves, sun glasses (UVA+UVB protection) and wide brim hats (4-inch brim around the entire circumference of the hat) are also recommended for sun protection.  - Call for new or changing lesions.  Skin cancer screening performed today.  Return in about 1 year (around 12/17/2022) for TBSE, 4-6 month AK follow up.  Graciella Belton, RMA, am acting as scribe for Forest Gleason, MD .   Documentation: I have reviewed the above documentation for accuracy and completeness, and I agree with the above.  Forest Gleason, MD

## 2021-12-24 ENCOUNTER — Other Ambulatory Visit: Payer: Self-pay | Admitting: Physician Assistant

## 2021-12-24 DIAGNOSIS — E7800 Pure hypercholesterolemia, unspecified: Secondary | ICD-10-CM

## 2021-12-24 DIAGNOSIS — F32A Depression, unspecified: Secondary | ICD-10-CM

## 2021-12-29 ENCOUNTER — Encounter: Payer: Self-pay | Admitting: Dermatology

## 2021-12-29 DIAGNOSIS — Z961 Presence of intraocular lens: Secondary | ICD-10-CM | POA: Diagnosis not present

## 2021-12-29 DIAGNOSIS — H0012 Chalazion right lower eyelid: Secondary | ICD-10-CM | POA: Diagnosis not present

## 2022-01-13 ENCOUNTER — Telehealth: Payer: Self-pay

## 2022-01-13 DIAGNOSIS — M3501 Sicca syndrome with keratoconjunctivitis: Secondary | ICD-10-CM | POA: Diagnosis not present

## 2022-01-13 DIAGNOSIS — Z961 Presence of intraocular lens: Secondary | ICD-10-CM | POA: Diagnosis not present

## 2022-01-13 NOTE — Telephone Encounter (Addendum)
  Patient called given bx results and other information provider recommend. She verbalized understanding and denies further questions.    ----- Message from Alfonso Patten, MD sent at 12/17/2021  2:28 PM EDT ----- Skin , left lower chest DYSPLASTIC COMPOUND NEVUS WITH MODERATE ATYPIA, LIMITED MARGINS FREE --> recheck within 6 months  This is a MODERATELY ATYPICAL MOLE. On the spectrum from normal mole to melanoma skin cancer, this is in between the two. - We need to recheck this area sometime in the next 6 months to be sure there is no evidence of the atypical mole coming back. If there is any color coming back, we would recommend repeating the biopsy to be sure the cells look normal.  - People who have a history of atypical moles do have a slightly increased risk of developing melanoma somewhere on the body, so a yearly full body skin exam by a dermatologist is recommended.  - Monthly self skin checks and daily sun protection are also recommended.  - Please call if you notice a dark spot coming back where this biopsy was taken.  - Please also call if you notice any new or changing spots anywhere else on the body before your follow-up visit.    MAs please call. Thank you!

## 2022-01-29 DIAGNOSIS — H0012 Chalazion right lower eyelid: Secondary | ICD-10-CM | POA: Diagnosis not present

## 2022-03-11 ENCOUNTER — Ambulatory Visit: Payer: Self-pay | Admitting: *Deleted

## 2022-03-11 NOTE — Telephone Encounter (Signed)
Summary: Memory loss, Rx request   Pt called requesting Donepezil HCI to help with her memory, she says she is suffering from memory issues. Willing to schedule an appt if needed   Best contact: [phone]     Reason for Disposition  New or worsening memory (forgetfulness) problems  Answer Assessment - Initial Assessment Questions 1. MAIN CONCERN OR SYMPTOM:  "What is your main concern right now?" "What questions do you have?" "What's the main symptom you're worried about?" (e.g., confusion, memory loss)     Memory loss concerns- "forgets everything", forgets where she puts things 2. ONSET:  "When did the symptom start (or worsen)?" (minutes, hours, days, weeks)     Over times- seems worse lately 3. BETTER-SAME-WORSE: "Are you (the patient) getting better, staying the same, or getting worse compared to the day you (they) were diagnosed or most recent hospital discharge ?"     Getting worse 4. DIAGNOSIS: "Was the dementia diagnosed by a doctor?" If Yes, ask: "When?" (e.g., days, months, years ago)     Not diagnosed  5. MEDICINES: "Has there been any change in medicines recently?" (e.g., narcotics, antihistamines, benzodiazepines, etc.)     No change 6. OTHER SYMPTOMS: "Are there any other symptoms?" (e.g., fever, cough, pain, falling)     no 7. SUPPORT: Document living circumstances and support (e.g., family, nursing home)     Patient lives by herself and would like to improved her memory  Protocols used: Dementia Symptoms and Questions-A-AH

## 2022-03-11 NOTE — Telephone Encounter (Signed)
  Chief Complaint: changes in memory Symptoms: forgetfulness  Frequency: chronic- seems to be getting worse Pertinent Negatives: Patient denies trouble with driving Disposition: '[]'$ ED /'[]'$ Urgent Care (no appt availability in office) / '[x]'$ Appointment(In office/virtual)/ '[]'$  [facility]/ '[]'$ Home Care/ '[]'$ Refused Recommended Disposition /'[]'$ [facility]/ '[]'$  Follow-up with PCP Additional Notes:  Patient wants to discuss options for treating memory loss.

## 2022-03-12 ENCOUNTER — Encounter: Payer: Self-pay | Admitting: Physician Assistant

## 2022-03-12 ENCOUNTER — Ambulatory Visit (INDEPENDENT_AMBULATORY_CARE_PROVIDER_SITE_OTHER): Payer: Medicare HMO | Admitting: Physician Assistant

## 2022-03-12 VITALS — Wt 105.3 lb

## 2022-03-12 DIAGNOSIS — R413 Other amnesia: Secondary | ICD-10-CM | POA: Diagnosis not present

## 2022-03-12 DIAGNOSIS — F419 Anxiety disorder, unspecified: Secondary | ICD-10-CM | POA: Diagnosis not present

## 2022-03-12 DIAGNOSIS — F321 Major depressive disorder, single episode, moderate: Secondary | ICD-10-CM | POA: Diagnosis not present

## 2022-03-12 NOTE — Progress Notes (Signed)
Established patient visit   Patient: Hailey Cain   DOB: [birth year]   76 y.o. Female  MRN: [id number] Visit Date: 03/12/2022  Today's healthcare provider: [name], PA-C   Cc. Memory changes  Subjective    HPI  Pt has concerns about increased anxiety, depression, memory changes.  Describes her memory changes: got something out of a cabinet and forgot to close it. Missing a turn while driving. Putting something down and being unable to find it.  She reports anxiety around money, finances, her health.  She reports loneliness, doesn't have many friends in the area. Often watches her grandchildren who ar young.      03/12/2022   11:27 AM  MMSE - Mini Mental State Exam  Orientation to time 5  Orientation to Place 5  Registration 3  Attention/ Calculation 4  Recall 2  Language- name 2 objects 2  Language- repeat 0  Language- follow 3 step command 3  Language- read & follow direction 0  Write a sentence 1  Copy design 1  Total score 26       03/12/2022   11:34 AM  GAD 7 : Generalized Anxiety Score  Nervous, Anxious, on Edge 2  Control/stop worrying 2  Worry too much - different things 2  Trouble relaxing 0  Restless 2  Easily annoyed or irritable 2  Afraid - awful might happen 2  Total GAD 7 Score 12  Anxiety Difficulty Not difficult at all  .    03/12/2022   11:33 AM 09/29/2021   10:40 AM 05/14/2021    8:26 AM  PHQ9 SCORE ONLY  PHQ-9 Total Score 10 0 0    Medications: Outpatient Medications Prior to Visit  Medication Sig   alendronate (FOSAMAX) 70 MG tablet Take 1 tablet (70 mg total) by mouth once a week. With a full glass of water   aspirin EC 325 MG tablet Take 325 mg by mouth daily.   atorvastatin (LIPITOR) 20 MG tablet TAKE 1 TABLET AT BEDTIME   Calcium Carbonate (CALCIUM 600 PO) Take 600 mg by mouth 2 (two) times a week.    Cholecalciferol (VITAMIN D-3) 25 MCG (1000 UT) CAPS Take 1,000 Units by mouth daily.   FLUoxetine (PROZAC) 20 MG  capsule TAKE 2 CAPSULES EVERY DAY   gabapentin (NEURONTIN) 100 MG capsule gabapentin 100 mg capsule  TAKE 1 CAPSULE BY MOUTH THREE TIMES DAILY   levothyroxine (SYNTHROID) 88 MCG tablet TAKE 1 TABLET EVERY DAY BEFORE BREAKFAST   Multiple Vitamins-Minerals (EMERGEN-C IMMUNE PO) Take 1 tablet by mouth daily as needed (immunity boost).    omeprazole (PRILOSEC) 20 MG capsule Take 1 capsule (20 mg total) by mouth daily.   vitamin B-12 (CYANOCOBALAMIN) 500 MCG tablet Take 500 mcg by mouth daily.   No facility-administered medications prior to visit.    Review of Systems  Constitutional:  Negative for fatigue and fever.  Respiratory:  Negative for cough and shortness of breath.   Cardiovascular:  Negative for chest pain and leg swelling.  Gastrointestinal:  Negative for abdominal pain.  Neurological:  Negative for dizziness and headaches.      Objective    Wt 105 lb 4.8 oz (47.8 kg)   BMI 19.90 kg/m  Weight 105 lb 4.8 oz (47.8 kg).   Physical Exam Vitals reviewed.  Constitutional:      Appearance: She is not ill-appearing.  HENT:     Head: Normocephalic.  Eyes:     Conjunctiva/sclera: Conjunctivae normal.  Cardiovascular:     Rate and Rhythm: Normal rate.  Pulmonary:     Effort: Pulmonary effort is normal. No respiratory distress.  Neurological:     General: No focal deficit present.     Mental Status: She is alert and oriented to person, place, and time.  Psychiatric:        Mood and Affect: Mood normal.        Behavior: Behavior normal.     No results found for any visits on 03/12/22.  Assessment & Plan     Problem List Items Addressed This Visit       Other   Anxiety   Relevant Orders   Ambulatory referral to Psychiatry   Depression, major, single episode, moderate ([place])   Relevant Orders   Ambulatory referral to Psychiatry   Memory changes - Primary    MSE 26/30 Definitely overlaps with symptoms of anxiety and depression. Pt is managed on prozac for  years Discussed referral to psychiatry to discuss additional or new medication that may help all of her symptoms      Relevant Orders   Ambulatory referral to Psychiatry      I, [name], PA-C have reviewed all documentation for this visit. The documentation on  03/12/2022 for the exam, diagnosis, procedures, and orders are all accurate and complete.  [name], PA-C [facility] [address] [place], [place], [address] Office: [phone] Fax: [place]

## 2022-03-12 NOTE — Assessment & Plan Note (Signed)
MSE 26/30 Definitely overlaps with symptoms of anxiety and depression. Pt is managed on prozac for years Discussed referral to psychiatry to discuss additional or new medication that may help all of her symptoms

## 2022-05-13 ENCOUNTER — Other Ambulatory Visit: Payer: Self-pay | Admitting: Physician Assistant

## 2022-05-13 DIAGNOSIS — M810 Age-related osteoporosis without current pathological fracture: Secondary | ICD-10-CM

## 2022-05-13 DIAGNOSIS — E039 Hypothyroidism, unspecified: Secondary | ICD-10-CM

## 2022-05-15 ENCOUNTER — Ambulatory Visit (INDEPENDENT_AMBULATORY_CARE_PROVIDER_SITE_OTHER): Payer: Medicare HMO | Admitting: Physician Assistant

## 2022-05-15 ENCOUNTER — Encounter: Payer: Self-pay | Admitting: Physician Assistant

## 2022-05-15 VITALS — BP 108/73 | HR 89 | Temp 98.6°F | Resp 16 | Ht 60.0 in

## 2022-05-15 DIAGNOSIS — R051 Acute cough: Secondary | ICD-10-CM | POA: Diagnosis not present

## 2022-05-15 DIAGNOSIS — U071 COVID-19: Secondary | ICD-10-CM

## 2022-05-15 LAB — POCT INFLUENZA A/B
Influenza A, POC: NEGATIVE
Influenza B, POC: NEGATIVE

## 2022-05-15 LAB — POC COVID19 BINAXNOW: SARS Coronavirus 2 Ag: POSITIVE — AB

## 2022-05-15 MED ORDER — MOLNUPIRAVIR EUA 200MG CAPSULE: 4.0000 | ORAL_CAPSULE | Freq: Two times a day (BID) | ORAL | 0 refills | Status: AC

## 2022-05-15 NOTE — Progress Notes (Signed)
I,Sulibeya S Dimas,acting as a Education administrator for [employer], PA-C.,have documented all relevant documentation on the behalf of [name], PA-C,as directed by  [name], PA-C while in the presence of [name], PA-C.    Established Patient Visit   Patient: Hailey Cain   DOB: 07/20/1945   77 y.o. Female  MRN: [id number] Visit Date: 05/15/2022  Today's healthcare provider: [name], PA-C    Subjective     Upper respiratory symptoms She complains of achiness, sore throat, and cough .with no fever, chills, night sweats or weight loss. Onset of symptoms was yesterday and gradually worsening.She is drinking plenty of fluids.  Past history is significant for occasional episodes of bronchitis. Patient is non-smoker  ---------------------------------------------------------------------------------------------------  Past Medical History:  Diagnosis Date   Acid reflux 12/30/2006   Adult hypothyroidism 01/13/2007   Allergic rhinitis 09/07/2014   Anxiety 12/10/2014   Basal cell carcinoma 2013   left nose, > 10 years ago   Bronchitis 05/17/2015   Cancer of cheek ([place]) 07/04/2007   Depression, neurotic 12/30/2006   Dysplastic nevus 12/16/2021   left lower chest moderate atypia   Hyperlipidemia    Prediabetes 08/11/2016   RAD (reactive airway disease) 09/07/2014   Small bowel obstruction ([place]) 01/31/2017   Past Surgical History:  Procedure Laterality Date   ABDOMINAL HYSTERECTOMY  1988   heavy, irregular menses; ovaries intact   AUGMENTATION MAMMAPLASTY Bilateral    silicone   BREAST SURGERY  1970   augmentation   CATARACT EXTRACTION Bilateral 2013   Dr. Talbert Forest.  [place].  [facility].   EYE SURGERY     cataract extraction   EYE SURGERY  2007   LASIK   MEMBRANE PEEL Left 05/25/2019   Procedure: MEMBRANE PEEL;  Surgeon: Bernarda Caffey, MD;  Location: [facility];  Service: Ophthalmology;  Laterality: Left;   PARS PLANA VITRECTOMY Left 05/25/2019   Procedure:  PARS PLANA VITRECTOMY WITH 25 GAUGE;  Surgeon: Bernarda Caffey, MD;  Location: [facility];  Service: Ophthalmology;  Laterality: Left;   TONSILLECTOMY AND ADENOIDECTOMY  1950   TUBAL LIGATION     YAG LASER APPLICATION Left 60/63/0160   Dr. Bernarda Caffey   Social History   Socioeconomic History   Marital status: Widowed    Spouse name: Laban Emperor   Number of children: 1   Years of education: H/S   Highest education level: 12th grade  Occupational History   Occupation: retired  Tobacco Use   Smoking status: Former    Packs/day: 1.00    Years: 20.00    Total pack years: 20.00    Types: Cigarettes    Quit date: 05/12/2003    Years since quitting: 19.0   Smokeless tobacco: Never  Vaping Use   Vaping Use: Never used  Substance and Sexual Activity   Alcohol use: No    Alcohol/week: 0.0 standard drinks of alcohol   Drug use: No   Sexual activity: Not on file  Other Topics Concern   Not on file  Social History Narrative   Not on file   Social Determinants of Health   Financial Resource Strain: Low Risk  (09/29/2021)   Overall Financial Resource Strain (CARDIA)    Difficulty of Paying Living Expenses: Not hard at all  Food Insecurity: No Food Insecurity (09/29/2021)   Hunger Vital Sign    Worried About Running Out of Food in the Last Year: Never true    Ran Out of Food in the Last Year: Never true  Transportation  Needs: No Transportation Needs (09/29/2021)   PRAPARE - Hydrologist (Medical): No    Lack of Transportation (Non-Medical): No  Physical Activity: Insufficiently Active (09/29/2021)   Exercise Vital Sign    Days of Exercise per Week: 2 days    Minutes of Exercise per Session: 20 min  Stress: No Stress Concern Present (09/29/2021)   [place]    Feeling of Stress : Not at all  Social Connections: Moderately Isolated (09/29/2021)   Social Connection and Isolation Panel [NHANES]     Frequency of Communication with Friends and Family: Twice a week    Frequency of Social Gatherings with Friends and Family: More than three times a week    Attends Religious Services: More than 4 times per year    Active Member of [employer] or Organizations: No    Attends Archivist Meetings: Never    Marital Status: Widowed  Intimate Partner Violence: Not At Risk (09/29/2021)   Humiliation, Afraid, Rape, and Kick questionnaire    Fear of Current or Ex-Partner: No    Emotionally Abused: No    Physically Abused: No    Sexually Abused: No   Family Status  Relation Name Status   Mother  Deceased at age 66   Father  Deceased at age 4       Heart Attack   Sister  Roberts   Family History  Problem Relation Age of Onset   Stroke Mother    CAD Mother    Heart attack Father    Hyperthyroidism Sister    Heart Problems Brother    Cancer Brother        skin   CAD Brother        CABG   No Known Allergies  Patient Care Team: [name], PA-C as PCP - General (Physician Assistant)   Medications: Outpatient Medications Prior to Visit  Medication Sig   alendronate (FOSAMAX) 70 MG tablet TAKE 1 TABLET ONCE A WEEK WITH A FULL GLASS OF WATER   aspirin EC 325 MG tablet Take 325 mg by mouth daily.   atorvastatin (LIPITOR) 20 MG tablet TAKE 1 TABLET AT BEDTIME   Calcium Carbonate (CALCIUM 600 PO) Take 600 mg by mouth 2 (two) times a week.    Cholecalciferol (VITAMIN D-3) 25 MCG (1000 UT) CAPS Take 1,000 Units by mouth daily.   FLUoxetine (PROZAC) 20 MG capsule TAKE 2 CAPSULES EVERY DAY   gabapentin (NEURONTIN) 100 MG capsule gabapentin 100 mg capsule  TAKE 1 CAPSULE BY MOUTH THREE TIMES DAILY   levothyroxine (SYNTHROID) 88 MCG tablet TAKE 1 TABLET EVERY DAY BEFORE BREAKFAST   Multiple Vitamins-Minerals (EMERGEN-C IMMUNE PO) Take 1 tablet by mouth daily as needed (immunity boost).    omeprazole (PRILOSEC) 20 MG capsule Take 1 capsule (20 mg total)  by mouth daily.   vitamin B-12 (CYANOCOBALAMIN) 500 MCG tablet Take 500 mcg by mouth daily.   No facility-administered medications prior to visit.    Review of Systems  HENT:  Positive for congestion, dental problem, ear pain, postnasal drip, rhinorrhea, sinus pressure, sinus pain, sneezing and sore throat.   Respiratory:  Positive for cough.   Neurological:  Positive for dizziness and headaches.  All other systems reviewed and are negative.   Last CBC Lab Results  Component Value Date   WBC 5.2 11/12/2021   HGB 12.9 11/12/2021  HCT 39.5 11/12/2021   MCV 96 11/12/2021   MCH 31.3 11/12/2021   RDW 12.7 11/12/2021   PLT 253 16/60/6301   Last metabolic panel Lab Results  Component Value Date   GLUCOSE 84 11/12/2021   NA 141 11/12/2021   K 4.2 11/12/2021   CL 104 11/12/2021   CO2 23 11/12/2021   BUN 14 11/12/2021   CREATININE 0.65 11/12/2021   EGFR 92 11/12/2021   CALCIUM 9.2 11/12/2021   PROT 6.3 11/12/2021   ALBUMIN 4.2 11/12/2021   LABGLOB 2.1 11/12/2021   AGRATIO 2.0 11/12/2021   BILITOT 0.3 11/12/2021   ALKPHOS 41 (L) 11/12/2021   AST 17 11/12/2021   ALT 12 11/12/2021   ANIONGAP 3 (L) 02/03/2017   Last lipids Lab Results  Component Value Date   CHOL 174 11/12/2021   HDL 62 11/12/2021   LDLCALC 90 11/12/2021   TRIG 125 11/12/2021   CHOLHDL 2.8 11/12/2021   Last hemoglobin A1c Lab Results  Component Value Date   HGBA1C 5.5 05/23/2019   Last thyroid functions Lab Results  Component Value Date   TSH 0.514 11/12/2021   T4TOTAL 7.0 11/01/2020   Last vitamin D No results found for: "25OHVITD2", "25OHVITD3", "VD25OH" Last vitamin B12 and Folate Lab Results  Component Value Date   VITAMINB12 685 11/12/2021      Objective    BP 108/73 (BP Location: Left Arm, Patient Position: Sitting, Cuff Size: Normal)   Pulse 89   Temp 98.6 F (37 C) (Oral)   Resp 16   Ht 5' (1.524 m)   SpO2 93%   BMI 20.56 kg/m  BP Readings from Last 3 Encounters:   05/15/22 108/73  11/12/21 121/61  05/14/21 (!) 128/56   Wt Readings from Last 3 Encounters:  03/12/22 105 lb 4.8 oz (47.8 kg)  11/12/21 103 lb (46.7 kg)  09/29/21 108 lb (49 kg)       Physical Exam Constitutional:      General: She is awake.     Appearance: She is well-developed.  HENT:     Head: Normocephalic.  Eyes:     Conjunctiva/sclera: Conjunctivae normal.  Cardiovascular:     Rate and Rhythm: Normal rate and regular rhythm.     Heart sounds: Normal heart sounds.  Pulmonary:     Effort: Pulmonary effort is normal.     Breath sounds: Normal breath sounds.  Skin:    General: Skin is warm.  Neurological:     Mental Status: She is alert and oriented to person, place, and time.  Psychiatric:        Attention and Perception: Attention normal.        Mood and Affect: Mood normal.        Speech: Speech normal.        Behavior: Behavior is cooperative.     Last depression screening scores    05/15/2022    9:21 AM 03/12/2022   11:33 AM 09/29/2021   10:40 AM  PHQ 2/9 Scores  PHQ - 2 Score 0 1 0  PHQ- 9 Score 0     Last fall risk screening    05/15/2022    9:21 AM  [place] in the past year? 1  Number falls in past yr: 0  Injury with Fall? 0  Risk for fall due to : No Fall Risks  Follow up Falls evaluation completed   Last Audit-C alcohol use screening    05/15/2022    9:22 AM  Alcohol Use Disorder Test (AUDIT)  1. How often do you have a drink containing alcohol? 0  2. How many drinks containing alcohol do you have on a typical day when you are drinking? 0  3. How often do you have six or more drinks on one occasion? 0  AUDIT-C Score 0   A score of 3 or more in women, and 4 or more in men indicates increased risk for alcohol abuse, EXCEPT if all of the points are from question 1   Results for orders placed or performed in visit on 05/15/22  POCT Influenza A/B  Result Value Ref Range   Influenza A, POC Negative Negative   Influenza B, POC  Negative Negative  POC COVID-19  Result Value Ref Range   SARS Coronavirus 2 Ag Positive (A) Negative    Assessment & Plan     Covid 19 Poc covid positive poc flu a/b negative  Discussed supportive treatment, fluids, rest, tylenol Candidate for antiviral given age, no gfr w/in 30 days ; rx molnupiravir x 5 days If any worsening symptoms, fever > 103, sob, wheezing please call office  Return if symptoms worsen or fail to improve.     I, [name], PA-C have reviewed all documentation for this visit. The documentation on  1/5/  for the exam, diagnosis, procedures, and orders are all accurate and complete.  [name], PA-C [facility] [address] [place], [place], [address] Office: [phone] Fax: [place]

## 2022-05-21 ENCOUNTER — Ambulatory Visit: Payer: Medicare HMO | Admitting: Dermatology

## 2022-06-03 ENCOUNTER — Ambulatory Visit: Payer: Medicare HMO | Admitting: Psychiatry

## 2022-06-09 ENCOUNTER — Other Ambulatory Visit: Payer: Self-pay | Admitting: Physician Assistant

## 2022-06-11 ENCOUNTER — Encounter: Payer: Self-pay | Admitting: Dermatology

## 2022-06-11 ENCOUNTER — Ambulatory Visit: Payer: Medicare HMO | Admitting: Dermatology

## 2022-06-11 VITALS — BP 122/71 | HR 84

## 2022-06-11 DIAGNOSIS — L814 Other melanin hyperpigmentation: Secondary | ICD-10-CM | POA: Diagnosis not present

## 2022-06-11 DIAGNOSIS — Z85828 Personal history of other malignant neoplasm of skin: Secondary | ICD-10-CM

## 2022-06-11 DIAGNOSIS — L821 Other seborrheic keratosis: Secondary | ICD-10-CM

## 2022-06-11 DIAGNOSIS — L570 Actinic keratosis: Secondary | ICD-10-CM

## 2022-06-11 DIAGNOSIS — L820 Inflamed seborrheic keratosis: Secondary | ICD-10-CM | POA: Diagnosis not present

## 2022-06-11 DIAGNOSIS — L578 Other skin changes due to chronic exposure to nonionizing radiation: Secondary | ICD-10-CM

## 2022-06-11 NOTE — Progress Notes (Signed)
Follow-Up Visit   Subjective  Hailey Cain is a 77 y.o. female who presents for the following: Actinic Keratosis (Follow up. Right calf, left hand/left arm, right anterior shoulder, right cheek, nose. Tx with Ln2).  The patient has spots, moles and lesions to be evaluated, some may be new or changing and the patient has concerns that these could be cancer.  The following portions of the chart were reviewed this encounter and updated as appropriate:  Tobacco  Allergies  Meds  Problems  Med Hx  Surg Hx  Fam Hx      Review of Systems: No other skin or systemic complaints except as noted in HPI or Assessment and Plan.  Objective  Well appearing patient in no apparent distress; mood and affect are within normal limits.  A focused examination was performed including face, chest, arms, hands, right calf. Relevant physical exam findings are noted in the Assessment and Plan.  right clavicle x1 Erythematous keratotic papule  Right Medial Breast x1, left forehead x1 (2) Erythematous keratotic or waxy stuck-on papule or plaque.  right cheek x1, left cheek x2, right calf x1 (4) Erythematous thin papules/macules with gritty scale. Slight residual at right cheek and right calf   Assessment & Plan  Hypertrophic actinic keratosis right clavicle x1  Actinic keratoses are precancerous spots that appear secondary to cumulative UV radiation exposure/sun exposure over time. They are chronic with expected duration over 1 year. A portion of actinic keratoses will progress to squamous cell carcinoma of the skin. It is not possible to reliably predict which spots will progress to skin cancer and so treatment is recommended to prevent development of skin cancer.  Recommend daily broad spectrum sunscreen SPF 30+ to sun-exposed areas, reapply every 2 hours as needed.  Recommend staying in the shade or wearing long sleeves, sun glasses (UVA+UVB protection) and wide brim hats (4-inch brim  around the entire circumference of the hat). Call for new or changing lesions.  Destruction of lesion - right clavicle x1  Destruction method: cryotherapy   Informed consent: discussed and consent obtained   Lesion destroyed using liquid nitrogen: Yes   Region frozen until ice ball extended beyond lesion: Yes   Outcome: patient tolerated procedure well with no complications   Post-procedure details: wound care instructions given   Additional details:  Prior to procedure, discussed risks of blister formation, small wound, skin dyspigmentation, or rare scar following cryotherapy. Recommend Vaseline ointment to treated areas while healing.   Inflamed seborrheic keratosis (2) Right Medial Breast x1, left forehead x1  Large inflamed SK  Symptomatic, irritating, patient would like treated.  Benign-appearing.  Call clinic for new or changing lesions.   Prior to procedure, discussed risks of blister formation, small wound, skin dyspigmentation, or rare scar following treatment. Recommend Vaseline ointment to treated areas while healing.   Destruction of lesion - Right Medial Breast x1, left forehead x1  Destruction method: cryotherapy   Informed consent: discussed and consent obtained   Lesion destroyed using liquid nitrogen: Yes   Region frozen until ice ball extended beyond lesion: Yes   Outcome: patient tolerated procedure well with no complications   Post-procedure details: wound care instructions given    AK (actinic keratosis) (4) right cheek x1, left cheek x2, right calf x1  Actinic keratoses are precancerous spots that appear secondary to cumulative UV radiation exposure/sun exposure over time. They are chronic with expected duration over 1 year. A portion of actinic keratoses will progress to squamous cell  carcinoma of the skin. It is not possible to reliably predict which spots will progress to skin cancer and so treatment is recommended to prevent development of skin  cancer.  Recommend daily broad spectrum sunscreen SPF 30+ to sun-exposed areas, reapply every 2 hours as needed.  Recommend staying in the shade or wearing long sleeves, sun glasses (UVA+UVB protection) and wide brim hats (4-inch brim around the entire circumference of the hat). Call for new or changing lesions.  Destruction of lesion - right cheek x1, left cheek x2, right calf x1  Destruction method: cryotherapy   Informed consent: discussed and consent obtained   Lesion destroyed using liquid nitrogen: Yes   Region frozen until ice ball extended beyond lesion: Yes   Outcome: patient tolerated procedure well with no complications   Post-procedure details: wound care instructions given   Additional details:  Prior to procedure, discussed risks of blister formation, small wound, skin dyspigmentation, or rare scar following cryotherapy. Recommend Vaseline ointment to treated areas while healing.    Actinic Damage - chronic, secondary to cumulative UV radiation exposure/sun exposure over time - diffuse scaly erythematous macules with underlying dyspigmentation - Recommend daily broad spectrum sunscreen SPF 30+ to sun-exposed areas, reapply every 2 hours as needed.  - Recommend staying in the shade or wearing long sleeves, sun glasses (UVA+UVB protection) and wide brim hats (4-inch brim around the entire circumference of the hat). - Call for new or changing lesions.   Seborrheic Keratoses - Stuck-on, waxy, tan-brown papules and/or plaques  - Benign-appearing - Discussed benign etiology and prognosis. - Observe - Call for any changes  Lentigines - Scattered tan macules - Due to sun exposure - Benign-appearing, observe - Recommend daily broad spectrum sunscreen SPF 30+ to sun-exposed areas, reapply every 2 hours as needed. - Call for any changes  History of Dysplastic Nevus. Left lower chest. 2023 - No evidence of recurrence today - Recommend regular full body skin exams -  Recommend daily broad spectrum sunscreen SPF 30+ to sun-exposed areas, reapply every 2 hours as needed.  - Call if any new or changing lesions are noted between office visits   Return for [place] Follow Up in 1-3 months.  I, Emelia Salisbury, CMA, am acting as scribe for Forest Gleason, MD.  Documentation: I have reviewed the above documentation for accuracy and completeness, and I agree with the above.  Forest Gleason, MD

## 2022-06-11 NOTE — Patient Instructions (Signed)
Cryotherapy Aftercare  Wash gently with soap and water everyday.   Apply Vaseline and Band-Aid daily until healed.    Recommend daily broad spectrum sunscreen SPF 30+ to sun-exposed areas, reapply every 2 hours as needed. Call for new or changing lesions.  Staying in the shade or wearing long sleeves, sun glasses (UVA+UVB protection) and wide brim hats (4-inch brim around the entire circumference of the hat) are also recommended for sun protection.    Due to recent changes in healthcare laws, you may see results of your pathology and/or laboratory studies on MyChart before the doctors have had a chance to review them. We understand that in some cases there may be results that are confusing or concerning to you. Please understand that not all results are received at the same time and often the doctors may need to interpret multiple results in order to provide you with the best plan of care or course of treatment. Therefore, we ask that you please give [place] 2 business days to thoroughly review all your results before contacting the office for clarification. Should we see a critical lab result, you will be contacted sooner.   If You Need Anything After Your Visit  If you have any questions or concerns for your doctor, please call our main line at [phone] and press option 4 to reach your doctor's medical assistant. If no one answers, please leave a voicemail as directed and we will return your call as soon as possible. Messages left after 4 pm will be answered the following business day.   You may also send [place] a message via [place]. We typically respond to MyChart messages within 1-2 business days.  For prescription refills, please ask your pharmacy to contact our office. Our fax number is [phone].  If you have an urgent issue when the clinic is closed that cannot wait until the next business day, you can page your doctor at the number below.    Please note that while we do our best to be  available for urgent issues outside of office hours, we are not available 24/7.   If you have an urgent issue and are unable to reach [place], you may choose to seek medical care at your doctor's office, retail clinic, urgent care center, or emergency room.  If you have a medical emergency, please immediately call 911 or go to the emergency department.  Pager Numbers  - Dr. Nehemiah Massed: [phone]  - Dr. Laurence Ferrari: [phone]  - Dr. Nicole Kindred: [phone]  In the event of inclement weather, please call our main line at [phone] for an update on the status of any delays or closures.  Dermatology Medication Tips: Please keep the boxes that topical medications come in in order to help keep track of the instructions about where and how to use these. Pharmacies typically print the medication instructions only on the boxes and not directly on the medication tubes.   If your medication is too expensive, please contact our office at [phone] option 4 or send [place] a message through [place].   We are unable to tell what your co-pay for medications will be in advance as this is different depending on your insurance coverage. However, we may be able to find a substitute medication at lower cost or fill out paperwork to get insurance to cover a needed medication.   If a prior authorization is required to get your medication covered by your insurance company, please allow [place] 1-2 business days to complete this process.  Drug prices often vary depending on where the prescription is filled and some pharmacies may offer cheaper prices.  The website www.goodrx.com contains coupons for medications through different pharmacies. The prices here do not account for what the cost may be with help from insurance (it may be cheaper with your insurance), but the website can give you the price if you did not use any insurance.  - You can print the associated coupon and take it with your prescription to the pharmacy.  -  You may also stop by our office during regular business hours and pick up a GoodRx coupon card.  - If you need your prescription sent electronically to a different pharmacy, notify our office through [facility] or by phone at [phone] option 4.     Si Usted Necesita Algo Despus de Su Visita  Tambin puede enviarnos un mensaje a travs de Pharmacist, community. Por lo general respondemos a los mensajes de MyChart en el transcurso de 1 a 2 das hbiles.  Para renovar recetas, por favor pida a su farmacia que se ponga en contacto con nuestra oficina. Harland Dingwall de fax es [place] [phone].  Si tiene un asunto urgente cuando la clnica est cerrada y que no puede esperar hasta el siguiente da hbil, puede llamar/localizar a su doctor(a) al nmero que aparece a continuacin.   Por favor, tenga en cuenta que aunque hacemos todo lo posible para estar disponibles para asuntos urgentes fuera del horario de [place], no estamos disponibles las 24 horas del da, los 7 das de la [place].   Si tiene un problema urgente y no puede comunicarse con nosotros, puede optar por buscar atencin mdica  en el consultorio de su doctor(a), en una clnica privada, en un centro de atencin urgente o en una sala de emergencias.  Si tiene Engineering geologist, por favor llame inmediatamente al 911 o vaya a la sala de emergencias.  Nmeros de bper  - Dr. Nehemiah Massed: [phone]  - Dra. Moye: [phone]  - Dra. Nicole Kindred: [phone]  En caso de inclemencias del [place], por favor llame a Johnsie Kindred principal al [phone] para una actualizacin sobre el [place] de cualquier retraso o cierre.  Consejos para la medicacin en dermatologa: Por favor, guarde las cajas en las que vienen los medicamentos de uso tpico para ayudarle a seguir las instrucciones sobre dnde y cmo usarlos. Las farmacias generalmente imprimen las instrucciones del medicamento slo en las cajas y no directamente en los tubos del  [place].   Si su medicamento es muy caro, por favor, pngase en contacto con Zigmund Daniel llamando al [phone] y presione la opcin 4 o envenos un mensaje a travs de Pharmacist, community.   No podemos decirle cul ser su copago por los medicamentos por adelantado ya que esto es diferente dependiendo de la cobertura de su seguro. Sin embargo, es posible que podamos encontrar un medicamento sustituto a Electrical engineer un formulario para que el seguro cubra el medicamento que se considera necesario.   Si se requiere una autorizacin previa para que su compaa de seguros [place] su medicamento, por favor permtanos de 1 a 2 das hbiles para completar este proceso.  Los precios de los medicamentos varan con frecuencia dependiendo del Environmental consultant de dnde se surte la receta y alguna farmacias pueden ofrecer precios ms baratos.  El sitio web www.goodrx.com tiene cupones para medicamentos de Airline pilot. Los precios aqu no tienen en cuenta lo que podra costar con la ayuda del seguro (puede ser ms  barato con su seguro), pero el sitio web puede darle el precio si no utiliz ningn seguro.  - Puede imprimir el cupn correspondiente y llevarlo con su receta a la farmacia.  - Tambin puede pasar por nuestra oficina durante el horario de atencin regular y recoger una tarjeta de cupones de GoodRx.  - Si necesita que su receta se enve electrnicamente a una farmacia diferente, informe a nuestra oficina a travs de MyChart de  o por telfono llamando al [phone] y presione la opcin 4.  

## 2022-06-23 ENCOUNTER — Encounter: Payer: Self-pay | Admitting: Dermatology

## 2022-07-01 DIAGNOSIS — H1131 Conjunctival hemorrhage, right eye: Secondary | ICD-10-CM | POA: Diagnosis not present

## 2022-07-14 ENCOUNTER — Telehealth: Payer: Self-pay | Admitting: Physician Assistant

## 2022-07-14 NOTE — Telephone Encounter (Signed)
Contacted Hailey Cain to schedule their annual wellness visit. Appointment made for 10/06/2022.  [place] Direct Dial: [phone]

## 2022-07-24 ENCOUNTER — Other Ambulatory Visit: Payer: Self-pay | Admitting: Physician Assistant

## 2022-07-24 DIAGNOSIS — F32A Depression, unspecified: Secondary | ICD-10-CM

## 2022-07-24 DIAGNOSIS — E7800 Pure hypercholesterolemia, unspecified: Secondary | ICD-10-CM

## 2022-08-27 ENCOUNTER — Ambulatory Visit: Payer: Medicare HMO | Admitting: Dermatology

## 2022-08-27 ENCOUNTER — Encounter: Payer: Self-pay | Admitting: Dermatology

## 2022-08-27 VITALS — BP 122/71

## 2022-08-27 DIAGNOSIS — L578 Other skin changes due to chronic exposure to nonionizing radiation: Secondary | ICD-10-CM

## 2022-08-27 DIAGNOSIS — L821 Other seborrheic keratosis: Secondary | ICD-10-CM

## 2022-08-27 DIAGNOSIS — L570 Actinic keratosis: Secondary | ICD-10-CM | POA: Diagnosis not present

## 2022-08-27 NOTE — Progress Notes (Signed)
Follow-Up Visit   Subjective  Hailey Cain is a 77 y.o. female who presents for the following: AK 72mf/u R clavicle, R cheek, L cheek, R calf   The following portions of the chart were reviewed this encounter and updated as appropriate: medications, allergies, medical history  Review of Systems:  No other skin or systemic complaints except as noted in HPI or Assessment and Plan.  Objective  Well appearing patient in no apparent distress; mood and affect are within normal limits.   A focused examination was performed of the following areas: Face, chest, R leg  Relevant exam findings are noted in the Assessment and Plan.    Assessment & Plan   ACTINIC DAMAGE - chronic, secondary to cumulative UV radiation exposure/sun exposure over time - diffuse scaly erythematous macules with underlying dyspigmentation - Recommend daily broad spectrum sunscreen SPF 30+ to sun-exposed areas, reapply every 2 hours as needed.  - Recommend staying in the shade or wearing long sleeves, sun glasses (UVA+UVB protection) and wide brim hats (4-inch brim around the entire circumference of the hat). - Call for new or changing lesions.   Recommend daily broad spectrum sunscreen SPF 30+ to sun-exposed areas, reapply every 2 hours as needed. Call for new or changing lesions.  Staying in the shade or wearing long sleeves, sun glasses (UVA+UVB protection) and wide brim hats (4-inch brim around the entire circumference of the hat) are also recommended for sun protection.   Recommend taking Heliocare sun protection supplement daily in sunny weather for additional sun protection. For maximum protection on the sunniest days, you can take up to 2 capsules of regular Heliocare OR take 1 capsule of Heliocare Ultra. For prolonged exposure (such as a full day in the sun), you can repeat your dose of the supplement 4 hours after your first dose. Heliocare can be purchased at [employer], at some  [facility] or at [email or web address].    Actinic skin damage  AK (actinic keratosis)  ACTINIC KERATOSIS Exam: Erythematous thin papules/macules with gritty scale  Actinic keratoses are precancerous spots that appear secondary to cumulative UV radiation exposure/sun exposure over time. They are chronic with expected duration over 1 year. A portion of actinic keratoses will progress to squamous cell carcinoma of the skin. It is not possible to reliably predict which spots will progress to skin cancer and so treatment is recommended to prevent development of skin cancer.  Recommend daily broad spectrum sunscreen SPF 30+ to sun-exposed areas, reapply every 2 hours as needed.  Recommend staying in the shade or wearing long sleeves, sun glasses (UVA+UVB protection) and wide brim hats (4-inch brim around the entire circumference of the hat). Call for new or changing lesions.  Treatment Plan:  Prior to procedure, discussed risks of blister formation, small wound, skin dyspigmentation, or rare scar following cryotherapy. Recommend Vaseline ointment to treated areas while healing.  Destruction Procedure Note Destruction method: cryotherapy   Informed consent: discussed and consent obtained   Lesion destroyed using liquid nitrogen: Yes   Outcome: patient tolerated procedure well with no complications   Post-procedure details: wound care instructions given   Locations: L mid chest x 1 # of Lesions Treated: 1   SEBORRHEIC KERATOSIS - Stuck-on, waxy, tan-brown papules and/or plaques  - Benign-appearing - Discussed benign etiology and prognosis. - Observe - Call for any changes - face, chest  Return for as scheduled for TBSE, Hx of BCC, Hx of AKs.  I, Sonya Hupman, RMA, am acting as scribe  for Darden Dates, MD .   Documentation: I have reviewed the above documentation for accuracy and completeness, and I agree with the above.  Darden Dates, MD

## 2022-08-27 NOTE — Patient Instructions (Addendum)
Cryotherapy Aftercare  Wash gently with soap and water everyday.   Apply Vaseline and Band-Aid daily until healed.    Recommend daily broad spectrum sunscreen SPF 30+ to sun-exposed areas, reapply every 2 hours as needed. Call for new or changing lesions.  Staying in the shade or wearing long sleeves, sun glasses (UVA+UVB protection) and wide brim hats (4-inch brim around the entire circumference of the hat) are also recommended for sun protection.    Recommend taking Heliocare sun protection supplement daily in sunny weather for additional sun protection. For maximum protection on the sunniest days, you can take up to 2 capsules of regular Heliocare OR take 1 capsule of Heliocare Ultra. For prolonged exposure (such as a full day in the sun), you can repeat your dose of the supplement 4 hours after your first dose. Heliocare can be purchased at [facility], at some [facility] or at [email or web address].      Due to recent changes in healthcare laws, you may see results of your pathology and/or laboratory studies on MyChart before the doctors have had a chance to review them. We understand that in some cases there may be results that are confusing or concerning to you. Please understand that not all results are received at the same time and often the doctors may need to interpret multiple results in order to provide you with the best plan of care or course of treatment. Therefore, we ask that you please give us 2 business days to thoroughly review all your results before contacting the office for clarification. Should we see a critical lab result, you will be contacted sooner.   If You Need Anything After Your Visit  If you have any questions or concerns for your doctor, please call our main line at [phone] and press option 4 to reach your doctor's medical assistant. If no one answers, please leave a voicemail as directed and we will return your call as soon as possible. Messages left  after 4 pm will be answered the following business day.   You may also send us a message via MyChart. We typically respond to MyChart messages within 1-2 business days.  For prescription refills, please ask your pharmacy to contact our office. Our fax number is [phone].  If you have an urgent issue when the clinic is closed that cannot wait until the next business day, you can page your doctor at the number below.    Please note that while we do our best to be available for urgent issues outside of office hours, we are not available 24/7.   If you have an urgent issue and are unable to reach us, you may choose to seek medical care at your doctor's office, retail clinic, urgent care center, or emergency room.  If you have a medical emergency, please immediately call 911 or go to the emergency department.  Pager Numbers  - Dr. Kowalski: [phone]  - Dr. Moye: [phone]  - Dr. Stewart: [phone]  In the event of inclement weather, please call our main line at [phone] for an update on the status of any delays or closures.  Dermatology Medication Tips: Please keep the boxes that topical medications come in in order to help keep track of the instructions about where and how to use these. Pharmacies typically print the medication instructions only on the boxes and not directly on the medication tubes.   If your medication is too expensive, please contact our office at [phone] option 4 or send us   a message through MyChart.   We are unable to tell what your co-pay for medications will be in advance as this is different depending on your insurance coverage. However, we may be able to find a substitute medication at lower cost or fill out paperwork to get insurance to cover a needed medication.   If a prior authorization is required to get your medication covered by your insurance company, please allow us 1-2 business days to complete this process.  Drug prices often  vary depending on where the prescription is filled and some pharmacies may offer cheaper prices.  The website www.goodrx.com contains coupons for medications through different pharmacies. The prices here do not account for what the cost may be with help from insurance (it may be cheaper with your insurance), but the website can give you the price if you did not use any insurance.  - You can print the associated coupon and take it with your prescription to the pharmacy.  - You may also stop by our office during regular business hours and pick up a GoodRx coupon card.  - If you need your prescription sent electronically to a different pharmacy, notify our office through [facility] MyChart or by phone at [phone] option 4.     Si Usted Necesita Algo Despus de Su Visita  Tambin puede enviarnos un mensaje a travs de MyChart. Por lo general respondemos a los mensajes de MyChart en el transcurso de 1 a 2 das hbiles.  Para renovar recetas, por favor pida a su farmacia que se ponga en contacto con nuestra oficina. Nuestro nmero de fax es el [phone].  Si tiene un asunto urgente cuando la clnica est cerrada y que no puede esperar hasta el siguiente da hbil, puede llamar/localizar a su doctor(a) al nmero que aparece a continuacin.   Por favor, tenga en cuenta que aunque hacemos todo lo posible para estar disponibles para asuntos urgentes fuera del horario de oficina, no estamos disponibles las 24 horas del da, los 7 das de la semana.   Si tiene un problema urgente y no puede comunicarse con nosotros, puede optar por buscar atencin mdica  en el consultorio de su doctor(a), en una clnica privada, en un centro de atencin urgente o en una sala de emergencias.  Si tiene una emergencia mdica, por favor llame inmediatamente al 911 o vaya a la sala de emergencias.  Nmeros de bper  - Dr. Kowalski: [phone]  - Dra. Moye: [phone]  - Dra. Stewart: [phone]  En caso  de inclemencias del tiempo, por favor llame a nuestra lnea principal al [phone] para una actualizacin sobre el estado de cualquier retraso o cierre.  Consejos para la medicacin en dermatologa: Por favor, guarde las cajas en las que vienen los medicamentos de uso tpico para ayudarle a seguir las instrucciones sobre dnde y cmo usarlos. Las farmacias generalmente imprimen las instrucciones del medicamento slo en las cajas y no directamente en los tubos del medicamento.   Si su medicamento es muy caro, por favor, pngase en contacto con nuestra oficina llamando al [phone] y presione la opcin 4 o envenos un mensaje a travs de MyChart.   No podemos decirle cul ser su copago por los medicamentos por adelantado ya que esto es diferente dependiendo de la cobertura de su seguro. Sin embargo, es posible que podamos encontrar un medicamento sustituto a menor costo o llenar un formulario para que el seguro cubra el medicamento que se considera necesario.   Si se requiere   una autorizacin previa para que su compaa de seguros cubra su medicamento, por favor permtanos de 1 a 2 das hbiles para completar este proceso.  Los precios de los medicamentos varan con frecuencia dependiendo del lugar de dnde se surte la receta y alguna farmacias pueden ofrecer precios ms baratos.  El sitio web www.goodrx.com tiene cupones para medicamentos de diferentes farmacias. Los precios aqu no tienen en cuenta lo que podra costar con la ayuda del seguro (puede ser ms barato con su seguro), pero el sitio web puede darle el precio si no utiliz ningn seguro.  - Puede imprimir el cupn correspondiente y llevarlo con su receta a la farmacia.  - Tambin puede pasar por nuestra oficina durante el horario de atencin regular y recoger una tarjeta de cupones de GoodRx.  - Si necesita que su receta se enve electrnicamente a una farmacia diferente, informe a nuestra oficina a travs de MyChart de [facility] o  por telfono llamando al [phone] y presione la opcin 4.  

## 2022-10-06 ENCOUNTER — Ambulatory Visit (INDEPENDENT_AMBULATORY_CARE_PROVIDER_SITE_OTHER): Payer: Medicare HMO

## 2022-10-06 VITALS — Ht 62.0 in | Wt 102.0 lb

## 2022-10-06 DIAGNOSIS — Z0000 Encounter for general adult medical examination without abnormal findings: Secondary | ICD-10-CM | POA: Diagnosis not present

## 2022-10-06 NOTE — Progress Notes (Signed)
I connected with  [name] on 10/06/22 by a audio enabled telemedicine application and verified that I am speaking with the correct person using two identifiers.  Patient Location: Home  Provider Location: Office/Clinic  I discussed the limitations of evaluation and management by telemedicine. The patient expressed understanding and agreed to proceed.  Subjective:   Hailey Cain is a 77 y.o. female who presents for Medicare Annual (Subsequent) preventive examination.  Review of Systems    Cardiac Risk Factors include: advanced age (>35men, >88 women);dyslipidemia    Objective:    Today's Vitals   10/06/22 1330  Weight: 102 lb (46.3 kg)  Height: 5\' 2"  (1.575 m)   Body mass index is 18.66 kg/m.     10/06/2022    1:40 PM 09/29/2021   10:42 AM 10/10/2019    9:07 AM 05/25/2019    9:22 AM 10/05/2018    9:16 AM 09/28/2017    2:35 PM 01/31/2017    8:34 PM  Advanced Directives  Does Patient Have a Medical Advance Directive? No No No No Yes Yes No  Type of Agricultural consultant;Living will Healthcare Power of Attorney   Copy of Healthcare Power of Attorney in Chart?     No - copy requested No - copy requested   Would patient like information on creating a medical advance directive?  No - Patient declined No - Patient declined Yes (Inpatient - patient requests chaplain consult to create a medical advance directive)   No - Patient declined    Current Medications (verified) Outpatient Encounter Medications as of 10/06/2022  Medication Sig   alendronate (FOSAMAX) 70 MG tablet TAKE 1 TABLET ONCE A WEEK WITH A FULL GLASS OF WATER   aspirin EC 325 MG tablet Take 325 mg by mouth daily.   atorvastatin (LIPITOR) 20 MG tablet TAKE 1 TABLET AT BEDTIME   Calcium Carbonate (CALCIUM 600 PO) Take 600 mg by mouth 2 (two) times a week.    Cholecalciferol (VITAMIN D-3) 25 MCG (1000 UT) CAPS Take 1,000 Units by mouth daily.   FLUoxetine (PROZAC) 20 MG  capsule TAKE 2 CAPSULES EVERY DAY   gabapentin (NEURONTIN) 100 MG capsule gabapentin 100 mg capsule  TAKE 1 CAPSULE BY MOUTH THREE TIMES DAILY   levothyroxine (SYNTHROID) 88 MCG tablet TAKE 1 TABLET EVERY DAY BEFORE BREAKFAST   Multiple Vitamins-Minerals (EMERGEN-C IMMUNE PO) Take 1 tablet by mouth daily as needed (immunity boost).    omeprazole (PRILOSEC) 20 MG capsule TAKE 1 CAPSULE EVERY DAY   vitamin B-12 (CYANOCOBALAMIN) 500 MCG tablet Take 500 mcg by mouth daily.   No facility-administered encounter medications on file as of 10/06/2022.    Allergies (verified) Patient has no known allergies.   History: Past Medical History:  Diagnosis Date   Acid reflux 12/30/2006   Actinic keratosis    Adult hypothyroidism 01/13/2007   Allergic rhinitis 09/07/2014   Anxiety 12/10/2014   Basal cell carcinoma 2013   left nose, > 10 years ago   Bronchitis 05/17/2015   Cancer of cheek (HCC) 07/04/2007   Depression, neurotic 12/30/2006   Dysplastic nevus 12/16/2021   left lower chest moderate atypia   Hyperlipidemia    Prediabetes 08/11/2016   RAD (reactive airway disease) 09/07/2014   Small bowel obstruction (HCC) 01/31/2017   Past Surgical History:  Procedure Laterality Date   ABDOMINAL HYSTERECTOMY  1988   heavy, irregular menses; ovaries intact   AUGMENTATION MAMMAPLASTY Bilateral    silicone  BREAST SURGERY  1970   augmentation   CATARACT EXTRACTION Bilateral 2013   Dr. Vonna Kotyk.  [place].  [facility].   EYE SURGERY     cataract extraction   EYE SURGERY  2007   LASIK   MEMBRANE PEEL Left 05/25/2019   Procedure: MEMBRANE PEEL;  Surgeon: Rennis Chris, MD;  Location: [facility] OR;  Service: Ophthalmology;  Laterality: Left;   PARS PLANA VITRECTOMY Left 05/25/2019   Procedure: PARS PLANA VITRECTOMY WITH 25 GAUGE;  Surgeon: Rennis Chris, MD;  Location[facility] OR;  Service: Ophthalmology;  Laterality: Left;   TONSILLECTOMY AND ADENOIDECTOMY  1950   TUBAL LIGATION     YAG LASER APPLICATION Left  03/24/2019   Dr. Rennis Chris   Family History  Problem Relation Age of Onset   Stroke Mother    CAD Mother    Heart attack Father    Hyperthyroidism Sister    Heart Problems Brother    Cancer Brother        skin   CAD Brother        CABG   Social History   Socioeconomic History   Marital status: Widowed    Spouse name: Andres Ege   Number of children: 1   Years of education: H/S   Highest education level: 12th grade  Occupational History   Occupation: retired  Tobacco Use   Smoking status: Former    Packs/day: 1.00    Years: 20.00    Additional pack years: 0.00    Total pack years: 20.00    Types: Cigarettes    Quit date: 05/12/2003    Years since quitting: 19.4   Smokeless tobacco: Never  Vaping Use   Vaping Use: Never used  Substance and Sexual Activity   Alcohol use: No    Alcohol/week: 0.0 standard drinks of alcohol   Drug use: No   Sexual activity: Not on file  Other Topics Concern   Not on file  Social History Narrative   Not on file   Social Determinants of Health   Financial Resource Strain: Low Risk  (10/06/2022)   Overall Financial Resource Strain (CARDIA)    Difficulty of Paying Living Expenses: Not hard at all  Food Insecurity: No Food Insecurity (10/06/2022)   Hunger Vital Sign    Worried About Running Out of Food in the Last Year: Never true    Ran Out of Food in the Last Year: Never true  Transportation Needs: No Transportation Needs (10/06/2022)   PRAPARE - Administrator, Civil Service (Medical): No    Lack of Transportation (Non-Medical): No  Physical Activity: Insufficiently Active (10/06/2022)   Exercise Vital Sign    Days of Exercise per Week: 2 days    Minutes of Exercise per Session: 20 min  Stress: No Stress Concern Present (10/06/2022)   [name]    Feeling of Stress : Not at all  Social Connections: Moderately Isolated (10/06/2022)   Social Connection  and Isolation Panel [NHANES]    Frequency of Communication with Friends and Family: More than three times a week    Frequency of Social Gatherings with Friends and Family: More than three times a week    Attends Religious Services: More than 4 times per year    Active Member of [employer] or Organizations: No    Attends Banker Meetings: Never    Marital Status: Widowed    Tobacco Counseling Counseling given: Not Answered  Clinical  Intake:  Pre-visit preparation completed: Yes  Pain : No/denies pain   BMI - recorded: 18.66 Nutritional Status: BMI <19  Underweight Nutritional Risks: None Diabetes: No  How often do you need to have someone help you when you read instructions, pamphlets, or other written materials from your doctor or pharmacy?: 1 - Never  Diabetic?no  Interpreter Needed?: No  Comments: lives alone Information entered by :: B.Kalli Greenfield,LPN   Activities of Daily Living    10/06/2022    1:41 PM 05/15/2022    9:22 AM  In your present state of health, do you have any difficulty performing the following activities:  Hearing? 1 1  Vision? 0 0  Difficulty concentrating or making decisions? 0 0  Walking or climbing stairs? 0 0  Dressing or bathing? 0 0  Doing errands, shopping? 0 0  Preparing Food and eating ? N   Using the Toilet? N   In the past six months, have you accidently leaked urine? N   Do you have problems with loss of bowel control? N   Managing your Medications? N   Managing your Finances? N   Housekeeping or managing your Housekeeping? N     Patient Care Team: [name], PA-C as PCP - General (Physician Assistant)  Indicate any recent Medical Services you may have received from other than [facility] providers in the past year (date may be approximate).     Assessment:   This is a routine wellness examination for Hailey Cain.  Hearing/Vision screen Hearing Screening - Comments:: Inadequate hearing;getting hearing aides (daughter checking  on this) Vision Screening - Comments:: Adequate vision after cataract surgery Dr Clydene Pugh  Dietary issues and exercise activities discussed: Current Exercise Habits: Home exercise routine, Time (Minutes): 20, Frequency (Times/Week): 2, Weekly Exercise (Minutes/Week): 40, Intensity: Mild, Exercise limited by: orthopedic condition(s)   Goals Addressed             This Visit's Progress    DIET - EAT MORE FRUITS AND VEGETABLES   On track    Exercise    Not on track    Recommend starting back exercising at the gym 3 days a week for 1 hour.      Exercise 3x per week (30 min per time)   Not on track    Recommend to start back exercising 3 days a week for 30 minutes at a time. (Pt to start a silver sneakers class)       Depression Screen    10/06/2022    1:38 PM 05/15/2022    9:21 AM 03/12/2022   11:33 AM 09/29/2021   10:40 AM 05/14/2021    8:26 AM 11/01/2020   10:00 AM 10/10/2019    9:04 AM  PHQ 2/9 Scores  PHQ - 2 Score 0 0 1 0 0 0 1  PHQ- 9 Score  0    3     Fall Risk    10/06/2022    1:34 PM 05/15/2022    9:21 AM 03/12/2022   11:33 AM 09/29/2021   10:44 AM 05/14/2021    8:26 AM  Fall Risk   Falls in the past year? 1 1 1 1 1   Number falls in past yr: 1 0 1 0 1  Injury with Fall? 0 0 0 0 0  Risk for fall due to : History of fall(s);Impaired balance/gait No Fall Risks No Fall Risks History of fall(s)   Follow up Falls prevention discussed;Education provided Falls evaluation completed Falls evaluation completed Falls prevention discussed  FALL RISK PREVENTION PERTAINING TO THE HOME:  Any stairs in or around the home? Yes  If so, are there any without handrails? Yes  Home free of loose throw rugs in walkways, pet beds, electrical cords, etc? Yes  Adequate lighting in your home to reduce risk of falls? Yes   ASSISTIVE DEVICES UTILIZED TO PREVENT FALLS:  Life alert? No  Use of a cane, walker or w/c? No  Grab bars in the bathroom? No  Shower chair or bench in shower? No   Elevated toilet seat or a handicapped toilet? No   Cognitive Function:    03/12/2022   11:27 AM  MMSE - Mini Mental State Exam  Orientation to time 5  Orientation to Place 5  Registration 3  Attention/ Calculation 4  Recall 2  Language- name 2 objects 2  Language- repeat 0  Language- follow 3 step command 3  Language- read & follow direction 0  Write a sentence 1  Copy design 1  Total score 26        10/06/2022    1:46 PM 09/29/2021   10:47 AM 10/10/2019    9:11 AM 08/11/2016    9:21 AM  6CIT Screen  What Year? 0 points 0 points 0 points 0 points  What month? 0 points 0 points 0 points 0 points  What time? 0 points 0 points 0 points 0 points  Count back from 20 0 points 0 points 0 points 0 points  Months in reverse 0 points 2 points 2 points 0 points  Repeat phrase 0 points 0 points 0 points 4 points  Total Score 0 points 2 points 2 points 4 points    Immunizations Immunization History  Administered Date(s) Administered   COVID-19, mRNA, vaccine(Comirnaty)12 years and older 03/07/2022   Influenza, High Dose Seasonal PF 02/13/2015, 02/08/2017, 02/04/2018   Influenza-Unspecified 02/06/2019, 02/03/2021, 01/28/2022   PFIZER Comirnaty(Gray Top)Covid-19 Tri-Sucrose Vaccine 10/21/2020   PFIZER(Purple Top)SARS-COV-2 Vaccination 07/07/2019, 08/04/2019   Pneumococcal Conjugate-13 02/13/2015   Pneumococcal Polysaccharide-23 05/01/2017   Td 10/16/2010, 02/28/2018   Tdap 10/16/2010, 02/28/2018   Zoster Recombinat (Shingrix) 10/21/2020   Zoster, Live 10/27/2014    TDAP status: Up to date  Flu Vaccine status: Up to date  Pneumococcal vaccine status: Up to date  Covid-19 vaccine status: Completed vaccines  Qualifies for Shingles Vaccine? Yes   Zostavax completed Yes   Shingrix Completed?: Yes  Screening Tests Health Maintenance  Topic Date Due   Zoster Vaccines- Shingrix (2 of 2) 12/16/2020   COVID-19 Vaccine (5 - 2023-24 season) 05/02/2022   INFLUENZA VACCINE   12/10/2022   DEXA SCAN  06/27/2023   Medicare Annual Wellness (AWV)  10/06/2023   DTaP/Tdap/Td (5 - Td or Tdap) 02/29/2028   Pneumonia Vaccine 27+ Years old  Completed   Hepatitis C Screening  Completed   HPV VACCINES  Aged Out   Colonoscopy  Discontinued    Health Maintenance  Health Maintenance Due  Topic Date Due   Zoster Vaccines- Shingrix (2 of 2) 12/16/2020   COVID-19 Vaccine (5 - 2023-24 season) 05/02/2022    Colorectal cancer screening: No longer required.   Mammogram status: No longer required due to age.  Bone Density status: Completed yes. Results reflect: Bone density results: OSTEOPOROSIS. Repeat every 3 years.  Lung Cancer Screening: (Low Dose CT Chest recommended if Age 83-80 years, 30 pack-year currently smoking OR have quit w/in 15years.) does not qualify.   Lung Cancer Screening Referral: no  Additional Screening:  Hepatitis  C Screening: does not qualify; Completed yes  Vision Screening: Recommended annual ophthalmology exams for early detection of glaucoma and other disorders of the eye. Is the patient up to date with their annual eye exam?  Yes  Who is the provider or what is the name of the office in which the patient attends annual eye exams? Dr Clydene Pugh If pt is not established with a provider, would they like to be referred to a provider to establish care? No .   Dental Screening: Recommended annual dental exams for proper oral hygiene  Community Resource Referral / Chronic Care Management: CRR required this visit?  No   CCM required this visit?  No    Plan:    I have personally reviewed and noted the following in the patient's chart:   Medical and social history Use of alcohol, tobacco or illicit drugs  Current medications and supplements including opioid prescriptions. Patient is not currently taking opioid prescriptions. Functional ability and status Nutritional status Physical activity Advanced directives List of other  physicians Hospitalizations, surgeries, and ER visits in previous 12 months Vitals Screenings to include cognitive, depression, and falls Referrals and appointments  In addition, I have reviewed and discussed with patient certain preventive protocols, quality metrics, and best practice recommendations. A written personalized care plan for preventive services as well as general preventive health recommendations were provided to patient.    Sue Lush, LPN   10/13/5407   Nurse Notes: The patient states she is doing alright. She is concerned that she has fallen three times recently as her feet are numb which makes her imbalanced at times. She has no other voiced concerns or questions.

## 2022-10-06 NOTE — Patient Instructions (Signed)
Hailey Cain , Thank you for taking time to come for your Medicare Wellness Visit. I appreciate your ongoing commitment to your health goals. Please review the following plan we discussed and let me know if I can assist you in the future.   These are the goals we discussed:  Goals      DIET - EAT MORE FRUITS AND VEGETABLES     Exercise      Recommend starting back exercising at the gym 3 days a week for 1 hour.      Exercise 3x per week (30 min per time)     Recommend to start back exercising 3 days a week for 30 minutes at a time. (Pt to start a silver sneakers class)        This is a list of the screening recommended for you and due dates:  Health Maintenance  Topic Date Due   Zoster (Shingles) Vaccine (2 of 2) 12/16/2020   COVID-19 Vaccine (5 - 2023-24 season) 05/02/2022   Flu Shot  12/10/2022   DEXA scan (bone density measurement)  06/27/2023   Medicare Annual Wellness Visit  10/06/2023   DTaP/Tdap/Td vaccine (5 - Td or Tdap) 02/29/2028   Pneumonia Vaccine  Completed   Hepatitis C Screening  Completed   HPV Vaccine  Aged Out   Colon Cancer Screening  Discontinued    Advanced directives: no :mailed per pt request  Conditions/risks identified: :moderate falls risk  Next appointment: Follow up in one year for your annual wellness visit 10/11/2023 @1 :30pm telephone   Preventive Care 65 Years and Older, Female Preventive care refers to lifestyle choices and visits with your health care provider that can promote health and wellness. What does preventive care include? A yearly physical exam. This is also called an annual well check. Dental exams once or twice a year. Routine eye exams. Ask your health care provider how often you should have your eyes checked. Personal lifestyle choices, including: Daily care of your teeth and gums. Regular physical activity. Eating a healthy diet. Avoiding tobacco and drug use. Limiting alcohol use. Practicing safe sex. Taking low-dose  aspirin every day. Taking vitamin and mineral supplements as recommended by your health care provider. What happens during an annual well check? The services and screenings done by your health care provider during your annual well check will depend on your age, overall health, lifestyle risk factors, and family history of disease. Counseling  Your health care provider may ask you questions about your: Alcohol use. Tobacco use. Drug use. Emotional well-being. Home and relationship well-being. Sexual activity. Eating habits. History of falls. Memory and ability to understand (cognition). Work and work Astronomer. Reproductive health. Screening  You may have the following tests or measurements: Height, weight, and BMI. Blood pressure. Lipid and cholesterol levels. These may be checked every 5 years, or more frequently if you are over 81 years old. Skin check. Lung cancer screening. You may have this screening every year starting at age 28 if you have a 30-pack-year history of smoking and currently smoke or have quit within the past 15 years. Fecal occult blood test (FOBT) of the stool. You may have this test every year starting at age 22. Flexible sigmoidoscopy or colonoscopy. You may have a sigmoidoscopy every 5 years or a colonoscopy every 10 years starting at age 9. Hepatitis C blood test. Hepatitis B blood test. Sexually transmitted disease (STD) testing. Diabetes screening. This is done by checking your blood sugar (glucose) after you have  not eaten for a while (fasting). You may have this done every 1-3 years. Bone density scan. This is done to screen for osteoporosis. You may have this done starting at age 41. Mammogram. This may be done every 1-2 years. Talk to your health care provider about how often you should have regular mammograms. Talk with your health care provider about your test results, treatment options, and if necessary, the need for more tests. Vaccines  Your  health care provider may recommend certain vaccines, such as: Influenza vaccine. This is recommended every year. Tetanus, diphtheria, and acellular pertussis (Tdap, Td) vaccine. You may need a Td booster every 10 years. Zoster vaccine. You may need this after age 88. Pneumococcal 13-valent conjugate (PCV13) vaccine. One dose is recommended after age 84. Pneumococcal polysaccharide (PPSV23) vaccine. One dose is recommended after age 71. Talk to your health care provider about which screenings and vaccines you need and how often you need them. This information is not intended to replace advice given to you by your health care provider. Make sure you discuss any questions you have with your health care provider. Document Released: 05/24/2015 Document Revised: 01/15/2016 Document Reviewed: 02/26/2015 Elsevier Interactive Patient Education  2017 [employer].  Fall Prevention in the Home Falls can cause injuries. They can happen to people of all ages. There are many things you can do to make your home safe and to help prevent falls. What can I do on the outside of my home? Regularly fix the edges of walkways and driveways and fix any cracks. Remove anything that might make you trip as you walk through a door, such as a raised step or threshold. Trim any bushes or trees on the path to your home. Use bright outdoor lighting. Clear any walking paths of anything that might make someone trip, such as rocks or tools. Regularly check to see if handrails are loose or broken. Make sure that both sides of any steps have handrails. Any raised decks and porches should have guardrails on the edges. Have any leaves, snow, or ice cleared regularly. Use sand or salt on walking paths during winter. Clean up any spills in your garage right away. This includes oil or grease spills. What can I do in the bathroom? Use night lights. Install grab bars by the toilet and in the tub and shower. Do not use towel bars as  grab bars. Use non-skid mats or decals in the tub or shower. If you need to sit down in the shower, use a plastic, non-slip stool. Keep the floor dry. Clean up any water that spills on the floor as soon as it happens. Remove soap buildup in the tub or shower regularly. Attach bath mats securely with double-sided non-slip rug tape. Do not have throw rugs and other things on the floor that can make you trip. What can I do in the bedroom? Use night lights. Make sure that you have a light by your bed that is easy to reach. Do not use any sheets or blankets that are too big for your bed. They should not hang down onto the floor. Have a firm chair that has side arms. You can use this for support while you get dressed. Do not have throw rugs and other things on the floor that can make you trip. What can I do in the kitchen? Clean up any spills right away. Avoid walking on wet floors. Keep items that you use a lot in easy-to-reach places. If you need to  reach something above you, use a strong step stool that has a grab bar. Keep electrical cords out of the way. Do not use floor polish or wax that makes floors slippery. If you must use wax, use non-skid floor wax. Do not have throw rugs and other things on the floor that can make you trip. What can I do with my stairs? Do not leave any items on the stairs. Make sure that there are handrails on both sides of the stairs and use them. Fix handrails that are broken or loose. Make sure that handrails are as long as the stairways. Check any carpeting to make sure that it is firmly attached to the stairs. Fix any carpet that is loose or worn. Avoid having throw rugs at the top or bottom of the stairs. If you do have throw rugs, attach them to the floor with carpet tape. Make sure that you have a light switch at the top of the stairs and the bottom of the stairs. If you do not have them, ask someone to add them for you. What else can I do to help prevent  falls? Wear shoes that: Do not have high heels. Have rubber bottoms. Are comfortable and fit you well. Are closed at the toe. Do not wear sandals. If you use a stepladder: Make sure that it is fully opened. Do not climb a closed stepladder. Make sure that both sides of the stepladder are locked into place. Ask someone to hold it for you, if possible. Clearly mark and make sure that you can see: Any grab bars or handrails. First and last steps. Where the edge of each step is. Use tools that help you move around (mobility aids) if they are needed. These include: Canes. Walkers. Scooters. Crutches. Turn on the lights when you go into a dark area. Replace any light bulbs as soon as they burn out. Set up your furniture so you have a clear path. Avoid moving your furniture around. If any of your floors are uneven, fix them. If there are any pets around you, be aware of where they are. Review your medicines with your doctor. Some medicines can make you feel dizzy. This can increase your chance of falling. Ask your doctor what other things that you can do to help prevent falls. This information is not intended to replace advice given to you by your health care provider. Make sure you discuss any questions you have with your health care provider. Document Released: 02/21/2009 Document Revised: 10/03/2015 Document Reviewed: 06/01/2014 Elsevier Interactive Patient Education  2017 [employer].

## 2022-10-28 NOTE — Progress Notes (Unsigned)
I,Hailey Cain,acting as a Neurosurgeon for [employer], PA-C.,have documented all relevant documentation on the behalf of Hailey Ferguson, PA-C,as directed by  Hailey Ferguson, PA-C while in the presence of Hailey Ferguson, PA-C.   Complete physical exam   Patient: Hailey Cain   DOB: 30-Jul-1945   77 y.o. Female  MRN: [id number] Visit Date: 10/29/2022  Today's healthcare provider: [name], PA-C   Cc. cpe  Subjective    Hailey Cain is a 77 y.o. female who presents today for a complete physical exam.  She reports consuming a general diet. The patient does not participate in regular exercise at present. She generally feels well. She reports sleeping well. She does not have additional problems to discuss today.  HPI  Discussed the use of AI scribe software for clinical note transcription with the patient, who gave verbal consent to proceed.  History of Present Illness   The patient presents for a routine physical. They report feeling 'pretty good' for their age. They have been doing exercises recommended by a previous doctor and feel that their balance has improved slightly. They also mention some concerns about memory issues, but do not feel that these have worsened and perform well on memory tests. They have a positive outlook and are proactive in taking care of their health.       Past Medical History:  Diagnosis Date   Acid reflux 12/30/2006   Actinic keratosis    Adult hypothyroidism 01/13/2007   Allergic rhinitis 09/07/2014   Anxiety 12/10/2014   Basal cell carcinoma 2013   left nose, > 10 years ago   Bronchitis 05/17/2015   Cancer of cheek (HCC) 07/04/2007   Depression, neurotic 12/30/2006   Dysplastic nevus 12/16/2021   left lower chest moderate atypia   Hyperlipidemia    Prediabetes 08/11/2016   RAD (reactive airway disease) 09/07/2014   Small bowel obstruction (HCC) 01/31/2017   Past Surgical History:  Procedure Laterality Date    ABDOMINAL HYSTERECTOMY  1988   heavy, irregular menses; ovaries intact   AUGMENTATION MAMMAPLASTY Bilateral    silicone   BREAST SURGERY  1970   augmentation   CATARACT EXTRACTION Bilateral 2013   Dr. Vonna Kotyk.  [place].  [facility].   EYE SURGERY     cataract extraction   EYE SURGERY  2007   LASIK   MEMBRANE PEEL Left 05/25/2019   Procedure: MEMBRANE PEEL;  Surgeon: Rennis Chris, MD;  Location: [facility] OR;  Service: Ophthalmology;  Laterality: Left;   PARS PLANA VITRECTOMY Left 05/25/2019   Procedure: PARS PLANA VITRECTOMY WITH 25 GAUGE;  Surgeon: Rennis Chris, MD;  Location: [facility] OR;  Service: Ophthalmology;  Laterality: Left;   TONSILLECTOMY AND ADENOIDECTOMY  1950   TUBAL LIGATION     YAG LASER APPLICATION Left 03/24/2019   Dr. Rennis Chris   Social History   Socioeconomic History   Marital status: Widowed    Spouse name: Hailey Cain   Number of children: 1   Years of education: H/S   Highest education level: 12th grade  Occupational History   Occupation: retired  Tobacco Use   Smoking status: Former    Packs/day: 1.00    Years: 20.00    Additional pack years: 0.00    Total pack years: 20.00    Types: Cigarettes    Quit date: 05/12/2003    Years since quitting: 19.4   Smokeless tobacco: Never  Vaping Use   Vaping Use: Never used  Substance and Sexual Activity  Alcohol use: No    Alcohol/week: 0.0 standard drinks of alcohol   Drug use: No   Sexual activity: Not on file  Other Topics Concern   Not on file  Social History Narrative   Not on file   Social Determinants of Health   Financial Resource Strain: Low Risk  (10/06/2022)   Overall Financial Resource Strain (CARDIA)    Difficulty of Paying Living Expenses: Not hard at all  Food Insecurity: No Food Insecurity (10/06/2022)   Hunger Hailey Cain Sign    Worried About Running Out of Food in the Last Year: Never true    Ran Out of Food in the Last Year: Never true  Transportation Needs: No Transportation Needs (10/06/2022)    PRAPARE - Administrator, Civil Service (Medical): No    Lack of Transportation (Non-Medical): No  Physical Activity: Insufficiently Active (10/06/2022)   Exercise Hailey Cain Sign    Days of Exercise per Week: 2 days    Minutes of Exercise per Session: 20 min  Stress: No Stress Concern Present (10/06/2022)   Hailey Cain of Occupational Health - Occupational Stress Questionnaire    Feeling of Stress : Not at all  Social Connections: Moderately Isolated (10/06/2022)   Social Connection and Isolation Panel [NHANES]    Frequency of Communication with Friends and Family: More than three times a week    Frequency of Social Gatherings with Friends and Family: More than three times a week    Attends Religious Services: More than 4 times per year    Active Member of [employer] or Organizations: No    Attends Banker Meetings: Never    Marital Status: Widowed  Intimate Partner Violence: Not At Risk (10/06/2022)   Humiliation, Afraid, Rape, and Kick questionnaire    Fear of Current or Ex-Partner: No    Emotionally Abused: No    Physically Abused: No    Sexually Abused: No   Family Status  Relation Name Status   Mother  Deceased at age 20   Father  Deceased at age 48       Heart Attack   Sister  Alive   Brother  Alive   Brother  Alive   Family History  Problem Relation Age of Onset   Stroke Mother    CAD Mother    Heart attack Father    Hyperthyroidism Sister    Heart Problems Brother    Cancer Brother        skin   CAD Brother        CABG   No Known Allergies  Patient Care Team: Hailey Ferguson, PA-C as PCP - General (Physician Assistant)   Medications: Outpatient Medications Prior to Visit  Medication Sig   alendronate (FOSAMAX) 70 MG tablet TAKE 1 TABLET ONCE A WEEK WITH A FULL GLASS OF WATER   aspirin EC 325 MG tablet Take 325 mg by mouth daily.   atorvastatin (LIPITOR) 20 MG tablet TAKE 1 TABLET AT BEDTIME   Calcium Carbonate (CALCIUM 600 PO) Take  600 mg by mouth 2 (two) times a week.    Cholecalciferol (VITAMIN D-3) 25 MCG (1000 UT) CAPS Take 1,000 Units by mouth daily.   FLUoxetine (PROZAC) 20 MG capsule TAKE 2 CAPSULES EVERY DAY   gabapentin (NEURONTIN) 100 MG capsule gabapentin 100 mg capsule  TAKE 1 CAPSULE BY MOUTH THREE TIMES DAILY   levothyroxine (SYNTHROID) 88 MCG tablet TAKE 1 TABLET EVERY DAY BEFORE BREAKFAST   Multiple Vitamins-Minerals (EMERGEN-C IMMUNE PO)  Take 1 tablet by mouth daily as needed (immunity boost).    omeprazole (PRILOSEC) 20 MG capsule TAKE 1 CAPSULE EVERY DAY   vitamin B-12 (CYANOCOBALAMIN) 500 MCG tablet Take 500 mcg by mouth daily.   No facility-administered medications prior to visit.    Review of Systems  All other systems reviewed and are negative.   Objective    BP 126/66 (BP Location: Left Arm, Patient Position: Sitting, Cuff Size: Normal)   Pulse 76   Temp (!) 97.4 F (36.3 C) (Oral)   Resp 13   Ht 5\' 2"  (1.575 m)   Wt 102 lb 3.2 oz (46.4 kg)   SpO2 100%   BMI 18.69 kg/m   Physical Exam Constitutional:      General: She is awake.     Appearance: She is well-developed. She is not ill-appearing.  HENT:     Head: Normocephalic.     Right Ear: Tympanic membrane normal.     Left Ear: Tympanic membrane normal.     Nose: Nose normal. No congestion or rhinorrhea.     Mouth/Throat:     Pharynx: No oropharyngeal exudate or posterior oropharyngeal erythema.  Eyes:     Conjunctiva/sclera: Conjunctivae normal.     Pupils: Pupils are equal, round, and reactive to light.  Neck:     Thyroid: No thyroid mass or thyromegaly.  Cardiovascular:     Rate and Rhythm: Normal rate and regular rhythm.     Heart sounds: Normal heart sounds.  Pulmonary:     Effort: Pulmonary effort is normal.     Breath sounds: Normal breath sounds.  Abdominal:     Palpations: Abdomen is soft.     Tenderness: There is no abdominal tenderness.  Musculoskeletal:     Right lower leg: No swelling. No edema.      Left lower leg: No swelling. No edema.  Lymphadenopathy:     Cervical: No cervical adenopathy.  Skin:    General: Skin is warm.  Neurological:     Mental Status: She is alert and oriented to person, place, and time.  Psychiatric:        Attention and Perception: Attention normal.        Mood and Affect: Mood normal.        Speech: Speech normal.        Behavior: Behavior normal. Behavior is cooperative.     Last depression screening scores    10/06/2022    1:38 PM 05/15/2022    9:21 AM 03/12/2022   11:33 AM  PHQ 2/9 Scores  PHQ - 2 Score 0 0 1  PHQ- 9 Score  0    Last fall risk screening    10/06/2022    1:34 PM  Fall Risk   Falls in the past year? 1  Number falls in past yr: 1  Injury with Fall? 0  Risk for fall due to : History of fall(s);Impaired balance/gait  Follow up Falls prevention discussed;Education provided   Last Audit-C alcohol use screening    10/06/2022    1:38 PM  Alcohol Use Disorder Test (AUDIT)  1. How often do you have a drink containing alcohol? 0  2. How many drinks containing alcohol do you have on a typical day when you are drinking? 0  3. How often do you have six or more drinks on one occasion? 0  AUDIT-C Score 0   A score of 3 or more in women, and 4 or more in men indicates increased  risk for alcohol abuse, EXCEPT if all of the points are from question 1   No results found for any visits on 10/29/22.  Assessment & Plan    Routine Health Maintenance and Physical Exam  Exercise Activities and Dietary recommendations --balanced diet high in fiber and protein, low in sugars, carbs, fats. --physical activity/exercise 30 minutes 3-5 times a week     Immunization History  Administered Date(s) Administered   COVID-19, mRNA, vaccine(Comirnaty)12 years and older 03/07/2022   Influenza, High Dose Seasonal PF 02/13/2015, 02/08/2017, 02/04/2018   Influenza-Unspecified 02/06/2019, 02/03/2021, 01/28/2022   PFIZER Comirnaty(Gray Top)Covid-19  Tri-Sucrose Vaccine 10/21/2020   PFIZER(Purple Top)SARS-COV-2 Vaccination 07/07/2019, 08/04/2019   Pneumococcal Conjugate-13 02/13/2015   Pneumococcal Polysaccharide-23 05/01/2017   Td 10/16/2010, 02/28/2018   Tdap 10/16/2010, 02/28/2018   Zoster Recombinat (Shingrix) 10/21/2020   Zoster, Live 10/27/2014    Health Maintenance  Topic Date Due   Zoster Vaccines- Shingrix (2 of 2) 12/16/2020   COVID-19 Vaccine (5 - 2023-24 season) 05/02/2022   INFLUENZA VACCINE  12/10/2022   DEXA SCAN  06/27/2023   Medicare Annual Wellness (AWV)  10/06/2023   DTaP/Tdap/Td (5 - Td or Tdap) 02/29/2028   Pneumonia Vaccine 82+ Years old  Completed   Hepatitis C Screening  Completed   HPV VACCINES  Aged Out   Colonoscopy  Discontinued    Discussed health benefits of physical activity, and encouraged her to engage in regular exercise appropriate for her age and condition.  Problem List Items Addressed This Visit       Endocrine   Adult hypothyroidism    Repeat tsh/t4 Manages with levo 88 mcg      Relevant Orders   CBC w/Diff/Platelet   Comprehensive Metabolic Panel (CMET)   TSH + free T4     Other   Hypercholesterolemia without hypertriglyceridemia    Manages with lipitor 20 mg Repeat fasting lipids      Relevant Orders   CBC w/Diff/Platelet   Comprehensive Metabolic Panel (CMET)   Lipid Profile   Anxiety    Manages with prozac 40 mg  stable      Recurrent depression (HCC)    Manages with prozac 40 mg  Stable currently pt has excellent positive outlook      Memory changes    Pt denies ongoing issues. Last mse 26/30 Rec repeat in 6 mo       Relevant Orders   CBC w/Diff/Platelet   Comprehensive Metabolic Panel (CMET)   Other Visit Diagnoses     Annual physical exam    -  Primary        Return in about 6 months (around 04/30/2023) for depression, anxiety.     I, Hailey Ferguson, PA-C have reviewed all documentation for this visit. The documentation on  10/29/22   for  the exam, diagnosis, procedures, and orders are all accurate and complete.  Hailey Ferguson, PA-C [facility] [address] [place], [place], [address] Office: [phone] Fax: [phone]   [facility]

## 2022-10-29 ENCOUNTER — Encounter: Payer: Self-pay | Admitting: Physician Assistant

## 2022-10-29 ENCOUNTER — Ambulatory Visit (INDEPENDENT_AMBULATORY_CARE_PROVIDER_SITE_OTHER): Payer: Medicare HMO | Admitting: Physician Assistant

## 2022-10-29 VITALS — BP 126/66 | HR 76 | Temp 97.4°F | Resp 13 | Ht 62.0 in | Wt 102.2 lb

## 2022-10-29 DIAGNOSIS — E7800 Pure hypercholesterolemia, unspecified: Secondary | ICD-10-CM | POA: Diagnosis not present

## 2022-10-29 DIAGNOSIS — R413 Other amnesia: Secondary | ICD-10-CM

## 2022-10-29 DIAGNOSIS — F419 Anxiety disorder, unspecified: Secondary | ICD-10-CM

## 2022-10-29 DIAGNOSIS — E039 Hypothyroidism, unspecified: Secondary | ICD-10-CM

## 2022-10-29 DIAGNOSIS — Z0000 Encounter for general adult medical examination without abnormal findings: Secondary | ICD-10-CM

## 2022-10-29 DIAGNOSIS — F339 Major depressive disorder, recurrent, unspecified: Secondary | ICD-10-CM

## 2022-10-29 NOTE — Assessment & Plan Note (Signed)
Manages with lipitor 20 mg Repeat fasting lipids

## 2022-10-29 NOTE — Assessment & Plan Note (Signed)
Manages with prozac 40 mg  Stable currently pt has excellent positive outlook

## 2022-10-29 NOTE — Assessment & Plan Note (Signed)
Pt denies ongoing issues. Last mse 26/30 Rec repeat in 6 mo

## 2022-10-29 NOTE — Assessment & Plan Note (Signed)
Repeat tsh/t4 Manages with levo 88 mcg

## 2022-10-29 NOTE — Assessment & Plan Note (Signed)
Manages with prozac 40 mg  stable

## 2022-10-30 ENCOUNTER — Other Ambulatory Visit: Payer: Self-pay | Admitting: Physician Assistant

## 2022-10-30 ENCOUNTER — Telehealth: Payer: Self-pay

## 2022-10-30 DIAGNOSIS — E039 Hypothyroidism, unspecified: Secondary | ICD-10-CM

## 2022-10-30 LAB — CBC WITH DIFFERENTIAL/PLATELET
Basophils Absolute: 0 10*3/uL (ref 0.0–0.2)
Basos: 1 %
EOS (ABSOLUTE): 0.1 10*3/uL (ref 0.0–0.4)
Eos: 2 %
Hematocrit: 39.8 % (ref 34.0–46.6)
Hemoglobin: 12.8 g/dL (ref 11.1–15.9)
Immature Grans (Abs): 0 10*3/uL (ref 0.0–0.1)
Immature Granulocytes: 0 %
Lymphocytes Absolute: 1.5 10*3/uL (ref 0.7–3.1)
Lymphs: 32 %
MCH: 30.6 pg (ref 26.6–33.0)
MCHC: 32.2 g/dL (ref 31.5–35.7)
MCV: 95 fL (ref 79–97)
Monocytes Absolute: 0.5 10*3/uL (ref 0.1–0.9)
Monocytes: 10 %
Neutrophils Absolute: 2.6 10*3/uL (ref 1.4–7.0)
Neutrophils: 55 %
Platelets: 247 10*3/uL (ref 150–450)
RBC: 4.18 x10E6/uL (ref 3.77–5.28)
RDW: 12.7 % (ref 11.7–15.4)
WBC: 4.7 10*3/uL (ref 3.4–10.8)

## 2022-10-30 LAB — COMPREHENSIVE METABOLIC PANEL
ALT: 12 IU/L (ref 0–32)
AST: 21 IU/L (ref 0–40)
Albumin: 4.2 g/dL (ref 3.8–4.8)
Alkaline Phosphatase: 38 IU/L — ABNORMAL LOW (ref 44–121)
BUN/Creatinine Ratio: 19 (ref 12–28)
BUN: 15 mg/dL (ref 8–27)
Bilirubin Total: <0.2 mg/dL (ref 0.0–1.2)
CO2: 23 mmol/L (ref 20–29)
Calcium: 9.1 mg/dL (ref 8.7–10.3)
Chloride: 106 mmol/L (ref 96–106)
Creatinine, Ser: 0.77 mg/dL (ref 0.57–1.00)
Globulin, Total: 2.2 g/dL (ref 1.5–4.5)
Glucose: 88 mg/dL (ref 70–99)
Potassium: 4.4 mmol/L (ref 3.5–5.2)
Sodium: 141 mmol/L (ref 134–144)
Total Protein: 6.4 g/dL (ref 6.0–8.5)
eGFR: 80 mL/min/{1.73_m2} (ref >59)

## 2022-10-30 LAB — LIPID PANEL
Chol/HDL Ratio: 2.5 ratio (ref 0.0–4.4)
Cholesterol, Total: 177 mg/dL (ref 100–199)
HDL: 71 mg/dL (ref >39)
LDL Chol Calc (NIH): 93 mg/dL (ref 0–99)
Triglycerides: 67 mg/dL (ref 0–149)
VLDL Cholesterol Cal: 13 mg/dL (ref 5–40)

## 2022-10-30 LAB — TSH+FREE T4
Free T4: 1.31 ng/dL (ref 0.82–1.77)
TSH: 0.069 u[IU]/mL — ABNORMAL LOW (ref 0.450–4.500)

## 2022-10-30 MED ORDER — LEVOTHYROXINE SODIUM 75 MCG PO TABS
75.0000 ug | ORAL_TABLET | Freq: Every day | ORAL | 3 refills | Status: DC
Start: 2022-10-30 — End: 2023-05-18

## 2022-10-30 NOTE — Telephone Encounter (Signed)
Pt called for lab results. Shared provider's note.  [name], PA-C 10/30/2022  8:13 AM EDT     Thyroid is slightly overcorrected. I am lowering her dose to 75 mcg. Sent to [facility]. She needs repeat labs, labs only, in 6 weeks. ordered    Pt would like a call back to schedule lab appt.

## 2022-10-30 NOTE — Telephone Encounter (Signed)
Patient was advised.  

## 2022-12-09 ENCOUNTER — Encounter: Payer: Medicare HMO | Admitting: Dermatology

## 2022-12-10 DIAGNOSIS — E039 Hypothyroidism, unspecified: Secondary | ICD-10-CM | POA: Diagnosis not present

## 2022-12-14 ENCOUNTER — Ambulatory Visit (INDEPENDENT_AMBULATORY_CARE_PROVIDER_SITE_OTHER): Payer: Medicare HMO | Admitting: Dermatology

## 2022-12-14 ENCOUNTER — Encounter: Payer: Self-pay | Admitting: Dermatology

## 2022-12-14 VITALS — BP 145/77 | HR 77

## 2022-12-14 DIAGNOSIS — C44612 Basal cell carcinoma of skin of right upper limb, including shoulder: Secondary | ICD-10-CM | POA: Diagnosis not present

## 2022-12-14 DIAGNOSIS — W908XXA Exposure to other nonionizing radiation, initial encounter: Secondary | ICD-10-CM

## 2022-12-14 DIAGNOSIS — Z86018 Personal history of other benign neoplasm: Secondary | ICD-10-CM

## 2022-12-14 DIAGNOSIS — L570 Actinic keratosis: Secondary | ICD-10-CM | POA: Diagnosis not present

## 2022-12-14 DIAGNOSIS — L821 Other seborrheic keratosis: Secondary | ICD-10-CM

## 2022-12-14 DIAGNOSIS — D1801 Hemangioma of skin and subcutaneous tissue: Secondary | ICD-10-CM

## 2022-12-14 DIAGNOSIS — L578 Other skin changes due to chronic exposure to nonionizing radiation: Secondary | ICD-10-CM | POA: Diagnosis not present

## 2022-12-14 DIAGNOSIS — Z85828 Personal history of other malignant neoplasm of skin: Secondary | ICD-10-CM

## 2022-12-14 DIAGNOSIS — Z1283 Encounter for screening for malignant neoplasm of skin: Secondary | ICD-10-CM | POA: Diagnosis not present

## 2022-12-14 DIAGNOSIS — B070 Plantar wart: Secondary | ICD-10-CM

## 2022-12-14 DIAGNOSIS — L820 Inflamed seborrheic keratosis: Secondary | ICD-10-CM

## 2022-12-14 DIAGNOSIS — L814 Other melanin hyperpigmentation: Secondary | ICD-10-CM | POA: Diagnosis not present

## 2022-12-14 DIAGNOSIS — Q6689 Other  specified congenital deformities of feet: Secondary | ICD-10-CM

## 2022-12-14 DIAGNOSIS — Z872 Personal history of diseases of the skin and subcutaneous tissue: Secondary | ICD-10-CM

## 2022-12-14 DIAGNOSIS — D492 Neoplasm of unspecified behavior of bone, soft tissue, and skin: Secondary | ICD-10-CM

## 2022-12-14 DIAGNOSIS — D229 Melanocytic nevi, unspecified: Secondary | ICD-10-CM

## 2022-12-14 HISTORY — DX: Basal cell carcinoma of skin of right upper limb, including shoulder: C44.612

## 2022-12-14 NOTE — Patient Instructions (Addendum)
Cryotherapy Aftercare  Wash gently with soap and water everyday.   Apply Vaseline Jelly daily until healed.    Wound Care Instructions  Cleanse wound gently with soap and water once a day then pat dry with clean gauze. Apply a thin coat of Petrolatum (petroleum jelly, "Vaseline") over the wound (unless you have an allergy to this). We recommend that you use a new, sterile tube of Vaseline. Do not pick or remove scabs. Do not remove the yellow or white "healing tissue" from the base of the wound.  Cover the wound with fresh, clean, nonstick gauze and secure with paper tape. You may use Band-Aids in place of gauze and tape if the wound is small enough, but would recommend trimming much of the tape off as there is often too much. Sometimes Band-Aids can irritate the skin.  You should call the office for your biopsy report after 1 week if you have not already been contacted.  If you experience any problems, such as abnormal amounts of bleeding, swelling, significant bruising, significant pain, or evidence of infection, please call the office immediately.  FOR ADULT SURGERY PATIENTS: If you need something for pain relief you may take 1 extra strength Tylenol (acetaminophen) AND 2 Ibuprofen (200mg  each) together every 4 hours as needed for pain. (do not take these if you are allergic to them or if you have a reason you should not take them.) Typically, you may only need pain medication for 1 to 3 days.       Recommend daily broad spectrum sunscreen SPF 30+ to sun-exposed areas, reapply every 2 hours as needed. Call for new or changing lesions.  Staying in the shade or wearing long sleeves, sun glasses (UVA+UVB protection) and wide brim hats (4-inch brim around the entire circumference of the hat) are also recommended for sun protection.    Melanoma ABCDEs  Melanoma is the most dangerous type of skin cancer, and is the leading cause of death from skin disease.  You are more likely to develop  melanoma if you: Have light-colored skin, light-colored eyes, or red or blond hair Spend a lot of time in the sun Tan regularly, either outdoors or in a tanning bed Have had blistering sunburns, especially during childhood Have a close family member who has had a melanoma Have atypical moles or large birthmarks  Early detection of melanoma is key since treatment is typically straightforward and cure rates are extremely high if we catch it early.   The first sign of melanoma is often a change in a mole or a new dark spot.  The ABCDE system is a way of remembering the signs of melanoma.  A for asymmetry:  The two halves do not match. B for border:  The edges of the growth are irregular. C for color:  A mixture of colors are present instead of an even brown color. D for diameter:  Melanomas are usually (but not always) greater than 6mm - the size of a pencil eraser. E for evolution:  The spot keeps changing in size, shape, and color.  Please check your skin once per month between visits. You can use a small mirror in front and a large mirror behind you to keep an eye on the back side or your body.   If you see any new or changing lesions before your next follow-up, please call to schedule a visit.  Please continue daily skin protection including broad spectrum sunscreen SPF 30+ to sun-exposed areas, reapplying every 2  hours as needed when you're outdoors.   Staying in the shade or wearing long sleeves, sun glasses (UVA+UVB protection) and wide brim hats (4-inch brim around the entire circumference of the hat) are also recommended for sun protection.    Due to recent changes in healthcare laws, you may see results of your pathology and/or laboratory studies on MyChart before the doctors have had a chance to review them. We understand that in some cases there may be results that are confusing or concerning to you. Please understand that not all results are received at the same time and often the  doctors may need to interpret multiple results in order to provide you with the best plan of care or course of treatment. Therefore, we ask that you please give [place] 2 business days to thoroughly review all your results before contacting the office for clarification. Should we see a critical lab result, you will be contacted sooner.   If You Need Anything After Your Visit  If you have any questions or concerns for your doctor, please call our main line at [phone] and press option 4 to reach your doctor's medical assistant. If no one answers, please leave a voicemail as directed and we will return your call as soon as possible. Messages left after 4 pm will be answered the following business day.   You may also send [place] a message via MyChart. We typically respond to MyChart messages within 1-2 business days.  For prescription refills, please ask your pharmacy to contact our office. Our fax number is [phone].  If you have an urgent issue when the clinic is closed that cannot wait until the next business day, you can page your doctor at the number below.    Please note that while we do our best to be available for urgent issues outside of office hours, we are not available 24/7.   If you have an urgent issue and are unable to reach [place], you may choose to seek medical care at your doctor's office, retail clinic, urgent care center, or emergency room.  If you have a medical emergency, please immediately call 911 or go to the emergency department.  Pager Numbers  - Dr. Gwen Pounds: [phone]  - Dr. Roseanne Reno: [phone]  In the event of inclement weather, please call our main line at [phone] for an update on the status of any delays or closures.  Dermatology Medication Tips: Please keep the boxes that topical medications come in in order to help keep track of the instructions about where and how to use these. Pharmacies typically print the medication instructions only on the boxes  and not directly on the medication tubes.   If your medication is too expensive, please contact our office at [phone] option 4 or send [place] a message through MyChart.   We are unable to tell what your co-pay for medications will be in advance as this is different depending on your insurance coverage. However, we may be able to find a substitute medication at lower cost or fill out paperwork to get insurance to cover a needed medication.   If a prior authorization is required to get your medication covered by your insurance company, please allow [place] 1-2 business days to complete this process.  Drug prices often vary depending on where the prescription is filled and some pharmacies may offer cheaper prices.  The website www.goodrx.com contains coupons for medications through different pharmacies. The prices here do not account for what the cost may be  with help from insurance (it may be cheaper with your insurance), but the website can give you the price if you did not use any insurance.  - You can print the associated coupon and take it with your prescription to the pharmacy.  - You may also stop by our office during regular business hours and pick up a GoodRx coupon card.  - If you need your prescription sent electronically to a different pharmacy, notify our office through [facility] or by phone at [phone] option 4.     Si Usted Necesita Algo Despus de Su Visita  Tambin puede enviarnos un mensaje a travs de Clinical cytogeneticist. Por lo general respondemos a los mensajes de MyChart en el transcurso de 1 a 2 das hbiles.  Para renovar recetas, por favor pida a su farmacia que se ponga en contacto con nuestra oficina. Annie Sable de fax es [place] [phone].  Si tiene un asunto urgente cuando la clnica est cerrada y que no puede esperar hasta el siguiente da hbil, puede llamar/localizar a su doctor(a) al nmero que aparece a continuacin.   Por favor, tenga en cuenta que aunque  hacemos todo lo posible para estar disponibles para asuntos urgentes fuera del horario de [place], no estamos disponibles las 24 horas del da, los 7 [address]  [address] de la [place].   Si tiene un problema urgente y no puede comunicarse con nosotros, puede optar por buscar atencin mdica  en el consultorio de su doctor(a), en una clnica privada, en un centro de atencin urgente o en una sala de emergencias.  Si tiene Engineer, drilling, por favor llame inmediatamente al 911 o vaya a la sala de emergencias.  Nmeros de bper  - Dr. Gwen Pounds: [phone]  - Dra. Roseanne Reno: [phone]  En caso de inclemencias del [place], por favor llame a Lacy Duverney principal al [phone] para una actualizacin sobre el [place] de cualquier retraso o cierre.  Consejos para la medicacin en dermatologa: Por favor, guarde las cajas en las que vienen los medicamentos de uso tpico para ayudarle a seguir las instrucciones sobre dnde y cmo usarlos. Las farmacias generalmente imprimen las instrucciones del medicamento slo en las cajas y no directamente en los tubos del [place].   Si su medicamento es muy caro, por favor, pngase en contacto con Rolm Gala llamando al [phone] y presione la opcin 4 o envenos un mensaje a travs de Clinical cytogeneticist.   No podemos decirle cul ser su copago por los medicamentos por adelantado ya que esto es diferente dependiendo de la cobertura de su seguro. Sin embargo, es posible que podamos encontrar un medicamento sustituto a Audiological scientist un formulario para que el seguro cubra el medicamento que se considera necesario.   Si se requiere una autorizacin previa para que su compaa de seguros [place] su medicamento, por favor permtanos de 1 a 2 das hbiles para completar [address]  Los precios de los medicamentos varan con frecuencia dependiendo del Environmental consultant de dnde se surte la receta y alguna farmacias pueden ofrecer precios ms baratos.  El sitio web www.goodrx.com  tiene cupones para medicamentos de Health and safety inspector. Los precios aqu no tienen en cuenta lo que podra costar con la ayuda del seguro (puede ser ms barato con su seguro), pero el sitio web puede darle el precio si no utiliz Tourist information centre manager.  - Puede imprimir el cupn correspondiente y llevarlo con su receta a la farmacia.  - Tambin puede pasar por nuestra oficina durante el horario de atencin regular  y recoger una tarjeta de cupones de GoodRx.  - Si necesita que su receta se enve electrnicamente a una farmacia diferente, informe a nuestra oficina a travs de MyChart de [place] o por telfono llamando al [phone] y presione la opcin 4.

## 2022-12-14 NOTE — Progress Notes (Signed)
Follow-Up Visit   Subjective  Hailey Cain is a 77 y.o. female who presents for the following: Skin Cancer Screening and Full Body Skin Exam. HxBCC, HxDN. Hx of AK  The patient presents for Total-Body Skin Exam (TBSE) for skin cancer screening and mole check. The patient has spots, moles and lesions to be evaluated, some may be new or changing and the patient may have concern these could be cancer.    The following portions of the chart were reviewed this encounter and updated as appropriate: medications, allergies, medical history  Review of Systems:  No other skin or systemic complaints except as noted in HPI or Assessment and Plan.  Objective  Well appearing patient in no apparent distress; mood and affect are within normal limits.  A full examination was performed including scalp, head, eyes, ears, nose, lips, neck, chest, axillae, abdomen, back, buttocks, bilateral upper extremities, bilateral lower extremities, hands, feet, fingers, toes, fingernails, and toenails. All findings within normal limits unless otherwise noted below.   Relevant physical exam findings are noted in the Assessment and Plan.  Right Dorsal Wrist x1 Erythematous thin papules/macules with gritty scale.   right medial arm 3 mm pink papule with telangiectasias       left upper chest x1 Erythematous keratotic or waxy stuck-on papule or plaque.  Left Plantar Foot x1 Verrucous papules -- Discussed viral etiology and contagion.     Assessment & Plan   HISTORY OF BASAL CELL CARCINOMA OF THE SKIN. Left nose, 2013 - No evidence of recurrence today - Recommend regular full body skin exams - Recommend daily broad spectrum sunscreen SPF 30+ to sun-exposed areas, reapply every 2 hours as needed.  - Call if any new or changing lesions are noted between office visits  HISTORY OF DYSPLASTIC NEVUS. Let lower chest moderate atypia. 12/16/2021. No evidence of recurrence today Recommend regular full  body skin exams Recommend daily broad spectrum sunscreen SPF 30+ to sun-exposed areas, reapply every 2 hours as needed.  Call if any new or changing lesions are noted between office visits   SKIN CANCER SCREENING PERFORMED TODAY.  ACTINIC DAMAGE - Chronic condition, secondary to cumulative UV/sun exposure - diffuse scaly erythematous macules with underlying dyspigmentation - Recommend daily broad spectrum sunscreen SPF 30+ to sun-exposed areas, reapply every 2 hours as needed.  - Staying in the shade or wearing long sleeves, sun glasses (UVA+UVB protection) and wide brim hats (4-inch brim around the entire circumference of the hat) are also recommended for sun protection.  - Call for new or changing lesions.  LENTIGINES, SEBORRHEIC KERATOSES, HEMANGIOMAS - Benign normal skin lesions - Benign-appearing - Call for any changes  MELANOCYTIC NEVI - Tan-brown and/or pink-flesh-colored symmetric macules and papules - Benign appearing on exam today - Observation - Call clinic for new or changing moles - Recommend daily use of broad spectrum spf 30+ sunscreen to sun-exposed areas.    Referral to podiatry for abnormal toe structure.   AK (actinic keratosis) Right Dorsal Wrist x1  Actinic keratoses are precancerous spots that appear secondary to cumulative UV radiation exposure/sun exposure over time. They are chronic with expected duration over 1 year. A portion of actinic keratoses will progress to squamous cell carcinoma of the skin. It is not possible to reliably predict which spots will progress to skin cancer and so treatment is recommended to prevent development of skin cancer.  Recommend daily broad spectrum sunscreen SPF 30+ to sun-exposed areas, reapply every 2 hours as needed.  Recommend  staying in the shade or wearing long sleeves, sun glasses (UVA+UVB protection) and wide brim hats (4-inch brim around the entire circumference of the hat). Call for new or changing  lesions.  Destruction of lesion - Right Dorsal Wrist x1 Complexity: simple   Destruction method: cryotherapy   Informed consent: discussed and consent obtained   Timeout:  patient name, date of birth, surgical site, and procedure verified Lesion destroyed using liquid nitrogen: Yes   Region frozen until ice ball extended beyond lesion: Yes   Outcome: patient tolerated procedure well with no complications   Post-procedure details: wound care instructions given   Additional details:  Prior to procedure, discussed risks of blister formation, small wound, skin dyspigmentation, or rare scar following cryotherapy. Recommend Vaseline ointment to treated areas while healing.   Neoplasm of skin right medial arm  Skin / nail biopsy Type of biopsy: tangential   Informed consent: discussed and consent obtained   Timeout: patient name, date of birth, surgical site, and procedure verified   Procedure prep:  Patient was prepped and draped in usual sterile fashion Prep type:  Isopropyl alcohol Anesthesia: the lesion was anesthetized in a standard fashion   Anesthetic:  1% lidocaine w/ epinephrine 1-100,000 buffered w/ 8.4% NaHCO3 Instrument used: flexible razor blade   Hemostasis achieved with: pressure, aluminum chloride and electrodesiccation   Outcome: patient tolerated procedure well   Post-procedure details: sterile dressing applied and wound care instructions given   Dressing type: bandage and petrolatum    Specimen 1 - Surgical pathology Differential Diagnosis: R/O BCC  Check Margins: No  Inflamed seborrheic keratosis left upper chest x1  Symptomatic, irritating, patient would like treated.  Destruction of lesion - left upper chest x1 Complexity: simple   Destruction method: cryotherapy   Informed consent: discussed and consent obtained   Timeout:  patient name, date of birth, surgical site, and procedure verified Lesion destroyed using liquid nitrogen: Yes   Region frozen until  ice ball extended beyond lesion: Yes   Outcome: patient tolerated procedure well with no complications   Post-procedure details: wound care instructions given   Additional details:  Prior to procedure, discussed risks of blister formation, small wound, skin dyspigmentation, or rare scar following cryotherapy. Recommend Vaseline ointment to treated areas while healing.   Plantar wart Left Plantar Foot x1  Viral Wart (HPV) Counseling  Discussed viral / HPV (Human Papilloma Virus) etiology and risk of spread /infectivity to other areas of body as well as to other people.  Multiple treatments and methods may be required to clear warts and it is possible treatment may not be successful.  Treatment risks include discoloration; scarring and there is still potential for wart recurrence.  Destruction of lesion - Left Plantar Foot x1 Complexity: simple   Destruction method: cryotherapy   Informed consent: discussed and consent obtained   Timeout:  patient name, date of birth, surgical site, and procedure verified Lesion destroyed using liquid nitrogen: Yes   Region frozen until ice ball extended beyond lesion: Yes   Outcome: patient tolerated procedure well with no complications   Post-procedure details: wound care instructions given   Additional details:  Prior to procedure, discussed risks of blister formation, small wound, skin dyspigmentation, or rare scar following cryotherapy. Recommend Vaseline ointment to treated areas while healing.   Seborrheic keratoses  Actinic elastosis  Multiple benign nevi  Cherry angioma  Clinodactyly of toe  Related Procedures Ambulatory referral to Podiatry   Return in about 6 months (around 06/16/2023) for  TBSE, HxBCC, HxDN.  I, Lawson Radar, CMA, am acting as scribe for Elie Goody, MD.   Documentation: I have reviewed the above documentation for accuracy and completeness, and I agree with the above.  Elie Goody, MD

## 2022-12-22 ENCOUNTER — Other Ambulatory Visit: Payer: Self-pay | Admitting: Family Medicine

## 2022-12-22 DIAGNOSIS — E039 Hypothyroidism, unspecified: Secondary | ICD-10-CM

## 2022-12-23 ENCOUNTER — Encounter: Payer: Medicare HMO | Admitting: Dermatology

## 2022-12-26 ENCOUNTER — Telehealth: Payer: Self-pay | Admitting: Dermatology

## 2022-12-26 NOTE — Telephone Encounter (Signed)
Sent to [place] today: biopsy was sent to Dr Neale Burly so I did not receive the result.   Explanation: your biopsy shows a basal cell skin cancer in the second layer of the skin. This is the most common kind of skin cancer and is caused by damage from sun exposure. Basal cell skin cancers almost never spread beyond the skin, so they are not dangerous to your overall health. However, they will continue to grow, can bleed, cause nonhealing wounds, and disrupt nearby structures unless fully treated.   EDC: you return for a 15 minute appointment where we perform electrodesiccation and curettage [facility]). This involves three rounds of scraping and burning to destroy the skin cancer. It leaves a round wound slightly larger than the skin cancer and leaves a round white scar. Clearance of tumor is not confirmed with pathology. Approximately 75% cure rate. If the skin cancer comes back, we would need to do a surgery to remove it.   Excision: you return for an hour long appointment where we perform a skin surgery. We numb the site of the skin cancer and a safety margin of normal skin around it. We remove the full thickness of skin and close the wound with two layers of stitches. The sample is sent to the lab to check that the skin cancer was fully removed. Return one week later to have wound checked and surface stitches removed. Surgical wound leaves a line scar. Approximately 95% cure rate.

## 2022-12-29 ENCOUNTER — Telehealth: Payer: Self-pay

## 2022-12-29 NOTE — Telephone Encounter (Signed)
-----   Message from [facility] sent at 12/26/2022  1:59 PM EDT ----- Regarding: biopsy sent to Dr Neale Burly Renea Ee,  [facility] you're having a good weekend. This biopsy was sent to Dr Neale Burly so I never saw the result. Is there a way to get it sent to my in basket? Please call the patient to share the diagnosis and offer EDC vs excision.  Explanation: your biopsy shows a basal cell skin cancer in the second layer of the skin. This is the most common kind of skin cancer and is caused by damage from sun exposure. Basal cell skin cancers almost never spread beyond the skin, so they are not dangerous to your overall health. However, they will continue to grow, can bleed, cause nonhealing wounds, and disrupt nearby structures unless fully treated.  EDC: you return for a 15 minute appointment where we perform electrodesiccation and curettage [facility]). This involves three rounds of scraping and burning to destroy the skin cancer. It leaves a round wound slightly larger than the skin cancer and leaves a round white scar. Clearance of tumor is not confirmed with pathology. Approximately 75% cure rate. If the skin cancer comes back, we would need to do a surgery to remove it.  Excision: you return for an hour long appointment where we perform a skin surgery. We numb the site of the skin cancer and a safety margin of normal skin around it. We remove the full thickness of skin and close the wound with two layers of stitches. The sample is sent to the lab to check that the skin cancer was fully removed. Return one week later to have wound checked and surface stitches removed. Surgical wound leaves a line scar. Approximately 95% cure rate.  Thank you, Dr Katrinka Blazing

## 2022-12-29 NOTE — Telephone Encounter (Signed)
Called patient to discuss pathology results and treatment options. N/A. LMOVM to return my call.

## 2022-12-30 NOTE — Telephone Encounter (Signed)
Patient advised of BX results and has decided to schedule surgery with Dr. Katrinka Blazing. Aw

## 2023-01-20 ENCOUNTER — Encounter: Payer: Self-pay | Admitting: Dermatology

## 2023-01-20 ENCOUNTER — Ambulatory Visit (INDEPENDENT_AMBULATORY_CARE_PROVIDER_SITE_OTHER): Payer: Medicare HMO | Admitting: Dermatology

## 2023-01-20 VITALS — BP 114/65 | HR 67

## 2023-01-20 DIAGNOSIS — C44612 Basal cell carcinoma of skin of right upper limb, including shoulder: Secondary | ICD-10-CM

## 2023-01-20 DIAGNOSIS — L988 Other specified disorders of the skin and subcutaneous tissue: Secondary | ICD-10-CM | POA: Diagnosis not present

## 2023-01-20 MED ORDER — MUPIROCIN 2 % EX OINT: 1.0000 | TOPICAL_OINTMENT | Freq: Every day | CUTANEOUS | 0 refills | Status: DC

## 2023-01-20 NOTE — Progress Notes (Unsigned)
   Follow-Up Visit   Subjective  Hailey Cain is a 77 y.o. female who presents for the following: Excision of bx proven nodular BCC at right medial forearm  The following portions of the chart were reviewed this encounter and updated as appropriate: medications, allergies, medical history  Review of Systems:  No other skin or systemic complaints except as noted in HPI or Assessment and Plan.  Objective  Well appearing patient in no apparent distress; mood and affect are within normal limits.  A focused examination was performed of the following areas: Right forearm  Relevant physical exam findings are noted in the Assessment and Plan.   right medial forearm Pink healing biopsy site    Assessment & Plan   Basal cell carcinoma (BCC) of skin of right upper extremity including shoulder right medial forearm  Skin excision  Total excision diameter (cm):  1 Informed consent: discussed and consent obtained   Timeout: patient name, date of birth, surgical site, and procedure verified   Procedure prep:  Patient was prepped and draped in usual sterile fashion Prep type:  Povidone-iodine Anesthesia: the lesion was anesthetized in a standard fashion   Anesthetic:  1% lidocaine w/ epinephrine 1-100,000 buffered w/ 8.4% NaHCO3 (7 ml) Instrument used: #15 blade   Hemostasis achieved with: pressure and electrodesiccation   Outcome: patient tolerated procedure well with no complications   Post-procedure details: sterile dressing applied and wound care instructions given   Dressing type: petrolatum and pressure dressing   Additional details:  Tagged lateral  Skin repair Complexity:  Intermediate Final length (cm):  4 Undermining: edges could be approximated without difficulty   Subcutaneous layers (deep stitches):  Suture size:  4-0 Suture type: Monocryl (poliglecaprone 25)   Stitches:  Buried vertical mattress Fine/surface layer approximation (top stitches):  Suture size:   5-0 Suture type: Prolene (polypropylene)   Stitches: simple running   Suture removal (days):  7 Hemostasis achieved with: suture Outcome: patient tolerated procedure well with no complications   Post-procedure details: sterile dressing applied and wound care instructions given   Dressing type: pressure dressing (mupirocin)    Specimen 1 - Surgical pathology Differential Diagnosis: R/O residual nodular BCC  Check Margins: Yes Previous path: [id number] Tagged lateral  Start Mupirocin ointment once daily with bandage changes.     Return in about 1 week (around 01/27/2023) for Suture Removal.  I, Lawson Radar, CMA, am acting as scribe for Elie Goody, MD.   Documentation: I have reviewed the above documentation for accuracy and completeness, and I agree with the above.  Elie Goody, MD

## 2023-01-20 NOTE — Patient Instructions (Signed)

## 2023-01-26 DIAGNOSIS — E039 Hypothyroidism, unspecified: Secondary | ICD-10-CM | POA: Diagnosis not present

## 2023-01-26 LAB — SURGICAL PATHOLOGY

## 2023-01-28 ENCOUNTER — Ambulatory Visit (INDEPENDENT_AMBULATORY_CARE_PROVIDER_SITE_OTHER): Payer: Medicare HMO | Admitting: Dermatology

## 2023-01-28 ENCOUNTER — Encounter: Payer: Self-pay | Admitting: Dermatology

## 2023-01-28 DIAGNOSIS — W57XXXA Bitten or stung by nonvenomous insect and other nonvenomous arthropods, initial encounter: Secondary | ICD-10-CM | POA: Diagnosis not present

## 2023-01-28 DIAGNOSIS — S30861A Insect bite (nonvenomous) of abdominal wall, initial encounter: Secondary | ICD-10-CM | POA: Diagnosis not present

## 2023-01-28 DIAGNOSIS — Z4802 Encounter for removal of sutures: Secondary | ICD-10-CM

## 2023-01-28 MED ORDER — CLOBETASOL PROPIONATE 0.05 % EX OINT: TOPICAL_OINTMENT | CUTANEOUS | 0 refills | Status: DC

## 2023-01-28 NOTE — Progress Notes (Signed)
   Follow-Up Visit   Subjective  Hailey Cain is a 77 y.o. female who presents for the following: Suture removal on the right medial forearm.   Pathology showed NO RESIDUAL BASAL CELL CARCINOMA, MARGINS FREE   Patient c/o a itchy stinging area on the right chest she noticed 3 days ago, she concerned this could be shingles or a bite. Patient requested evaluation  The following portions of the chart were reviewed this encounter and updated as appropriate: medications, allergies, medical history  Review of Systems:  No other skin or systemic complaints except as noted in HPI or Assessment and Plan.  Objective  Well appearing patient in no apparent distress; mood and affect are within normal limits.  Areas Examined: right arm, right trunk  Relevant physical exam findings are noted in the Assessment and Plan.    Assessment & Plan   Encounter for Removal of Sutures - Incision site is clean, dry and intact. - Wound cleansed, sutures removed, wound cleansed and steri strips applied.  - Discussed pathology results showing NO RESIDUAL BASAL CELL CARCINOMA, MARGINS FREE   - Patient advised to keep steri-strips dry until they fall off. - Scars remodel for a full year. - Once steri-strips fall off, patient can apply over-the-counter silicone scar cream once to twice a day to help with scar remodeling if desired. - Patient advised to call with any concerns or if they notice any new or changing lesions.  Arthropod BITE  Exam: Right abdomen has pink edematous plaque. No vesicles or pustules. No dermatomal spread to flank or back  Plan: Patient requested evaluation of a lesion, which converts no-cost suture removal to an office visit Does not appear to be shingles  Start Clobetasol ointment apply to affected skin twice a day until resolved, Avoid applying to face, groin, and axilla. Use as directed. Long-term use can cause thinning of the skin.    Topical steroids (such as  triamcinolone, fluocinolone, fluocinonide, mometasone, clobetasol, halobetasol, betamethasone, hydrocortisone) can cause thinning and lightening of the skin if they are used for too long in the same area. Your physician has selected the right strength medicine for your problem and area affected on the body. Please use your medication only as directed by your physician to prevent side effects.     Return for scheduled TBSE 06/15/2023, hx of BCC.  IAngelique Holm, CMA, am acting as scribe for Elie Goody, MD .   Documentation: I have reviewed the above documentation for accuracy and completeness, and I agree with the above.  Elie Goody, MD

## 2023-01-28 NOTE — Patient Instructions (Signed)
Due to recent changes in healthcare laws, you may see results of your pathology and/or laboratory studies on MyChart before the doctors have had a chance to review them. We understand that in some cases there may be results that are confusing or concerning to you. Please understand that not all results are received at the same time and often the doctors may need to interpret multiple results in order to provide you with the best plan of care or course of treatment. Therefore, we ask that you please give [place] 2 business days to thoroughly review all your results before contacting the office for clarification. Should we see a critical lab result, you will be contacted sooner.   If You Need Anything After Your Visit  If you have any questions or concerns for your doctor, please call our main line at [phone] and press option 4 to reach your doctor's medical assistant. If no one answers, please leave a voicemail as directed and we will return your call as soon as possible. Messages left after 4 pm will be answered the following business day.   You may also send [place] a message via MyChart. We typically respond to MyChart messages within 1-2 business days.  For prescription refills, please ask your pharmacy to contact our office. Our fax number is [phone].  If you have an urgent issue when the clinic is closed that cannot wait until the next business day, you can page your doctor at the number below.    Please note that while we do our best to be available for urgent issues outside of office hours, we are not available 24/7.   If you have an urgent issue and are unable to reach [place], you may choose to seek medical care at your doctor's office, retail clinic, urgent care center, or emergency room.  If you have a medical emergency, please immediately call 911 or go to the emergency department.  Pager Numbers  - Dr. Gwen Pounds: [phone]  - Dr. Roseanne Reno: [phone]  - Dr. Katrinka Blazing: [phone]    In the event of inclement weather, please call our main line at [phone] for an update on the status of any delays or closures.  Dermatology Medication Tips: Please keep the boxes that topical medications come in in order to help keep track of the instructions about where and how to use these. Pharmacies typically print the medication instructions only on the boxes and not directly on the medication tubes.   If your medication is too expensive, please contact our office at [phone] option 4 or send [place] a message through MyChart.   We are unable to tell what your co-pay for medications will be in advance as this is different depending on your insurance coverage. However, we may be able to find a substitute medication at lower cost or fill out paperwork to get insurance to cover a needed medication.   If a prior authorization is required to get your medication covered by your insurance company, please allow [place] 1-2 business days to complete this process.  Drug prices often vary depending on where the prescription is filled and some pharmacies may offer cheaper prices.  The website www.goodrx.com contains coupons for medications through different pharmacies. The prices here do not account for what the cost may be with help from insurance (it may be cheaper with your insurance), but the website can give you the price if you did not use any insurance.  - You can print the associated coupon and take it  with your prescription to the pharmacy.  - You may also stop by our office during regular business hours and pick up a GoodRx coupon card.  - If you need your prescription sent electronically to a different pharmacy, notify our office through [facility] or by phone at [phone] option 4.     Si Usted Necesita Algo Despus de Su Visita  Tambin puede enviarnos un mensaje a travs de Clinical cytogeneticist. Por lo general respondemos a los mensajes de MyChart en el transcurso de 1 a 2 das  hbiles.  Para renovar recetas, por favor pida a su farmacia que se ponga en contacto con nuestra oficina. Annie Sable de fax es [place] [phone].  Si tiene un asunto urgente cuando la clnica est cerrada y que no puede esperar hasta el siguiente da hbil, puede llamar/localizar a su doctor(a) al nmero que aparece a continuacin.   Por favor, tenga en cuenta que aunque hacemos todo lo posible para estar disponibles para asuntos urgentes fuera del horario de [place], no estamos disponibles las 24 horas del da, los 7 [address]  [address] de la [place].   Si tiene un problema urgente y no puede comunicarse con nosotros, puede optar por buscar atencin mdica  en el consultorio de su doctor(a), en una clnica privada, en un centro de atencin urgente o en una sala de emergencias.  Si tiene Engineer, drilling, por favor llame inmediatamente al 911 o vaya a la sala de emergencias.  Nmeros de bper  - Dr. Gwen Pounds: [phone]  - Dra. Roseanne Reno: [phone]  - Dr. Katrinka Blazing: [phone]   En caso de inclemencias del tiempo, por favor llame a Lacy Duverney principal al [phone] para una actualizacin sobre el [place] de cualquier retraso o cierre.  Consejos para la medicacin en dermatologa: Por favor, guarde las cajas en las que vienen los medicamentos de uso tpico para ayudarle a seguir las instrucciones sobre dnde y cmo usarlos. Las farmacias generalmente imprimen las instrucciones del medicamento slo en las cajas y no directamente en los tubos del [place].   Si su medicamento es muy caro, por favor, pngase en contacto con Rolm Gala llamando al [phone] y presione la opcin 4 o envenos un mensaje a travs de Clinical cytogeneticist.   No podemos decirle cul ser su copago por los medicamentos por adelantado ya que esto es diferente dependiendo de la cobertura de su seguro. Sin embargo, es posible que podamos encontrar un medicamento sustituto a Audiological scientist un formulario para que el  seguro cubra el medicamento que se considera necesario.   Si se requiere una autorizacin previa para que su compaa de seguros [place] su medicamento, por favor permtanos de 1 a 2 das hbiles para completar [address]  Los precios de los medicamentos varan con frecuencia dependiendo del Environmental consultant de dnde se surte la receta y alguna farmacias pueden ofrecer precios ms baratos.  El sitio web www.goodrx.com tiene cupones para medicamentos de Health and safety inspector. Los precios aqu no tienen en cuenta lo que podra costar con la ayuda del seguro (puede ser ms barato con su seguro), pero el sitio web puede darle el precio si no utiliz Tourist information centre manager.  - Puede imprimir el cupn correspondiente y llevarlo con su receta a la farmacia.  - Tambin puede pasar por nuestra oficina durante el horario de atencin regular y Education officer, museum una tarjeta de cupones de GoodRx.  - Si necesita que su receta se enve electrnicamente a Psychiatrist, informe a nuestra oficina a travs de MyChart de   o por telfono llamando al [phone] y presione la opcin 4.

## 2023-02-15 ENCOUNTER — Other Ambulatory Visit: Payer: Self-pay | Admitting: Physician Assistant

## 2023-02-15 DIAGNOSIS — M810 Age-related osteoporosis without current pathological fracture: Secondary | ICD-10-CM

## 2023-02-15 NOTE — Telephone Encounter (Signed)
Requested medication (s) are due for refill today: Yes  Requested medication (s) are on the active medication list: Yes  Last refill:  05/13/22  Future visit scheduled: Yes  Notes to clinic:  Unable to refill per protocol due to failed labs, no updated results.      Requested Prescriptions  Pending Prescriptions Disp Refills   alendronate (FOSAMAX) 70 MG tablet [Pharmacy Med Name: Alendronate Sodium Oral Tablet 70 MG] 12 tablet 3    Sig: TAKE 1 TABLET ONCE A WEEK WITH A FULL GLASS OF WATER     Endocrinology:  Bisphosphonates Failed - 02/15/2023  2:29 AM      Failed - Vitamin D in normal range and within 360 days    No results found for: "[id number]", "QI3474QV9", "[id number]", "25OHVITD3", "25OHVITD2", "25OHVITD1", "VD25OH"       Failed - Mg Level in normal range and within 360 days    Magnesium  Date Value Ref Range Status  10/10/2019 2.1 1.6 - 2.3 mg/dL Final         Failed - Phosphate in normal range and within 360 days    No results found for: "PHOS"       Passed - Ca in normal range and within 360 days    Calcium  Date Value Ref Range Status  10/29/2022 9.1 8.7 - 10.3 mg/dL Final         Passed - Cr in normal range and within 360 days    Creatinine, Ser  Date Value Ref Range Status  10/29/2022 0.77 0.57 - 1.00 mg/dL Final         Passed - eGFR is 30 or above and within 360 days    GFR calc Af Amer  Date Value Ref Range Status  10/10/2019 102 >59 mL/min/1.73 Final    Comment:    **[facility] currently reports eGFR in compliance with the current**   recommendations of the [employer]. [facility] will   update reporting as new guidelines are published from the NKF-ASN   Task force.    GFR calc non Af Amer  Date Value Ref Range Status  10/10/2019 89 >59 mL/min/1.73 Final   eGFR  Date Value Ref Range Status  10/29/2022 80 >59 mL/min/1.73 Final         Passed - Valid encounter within last 12 months    Recent Outpatient Visits           3 months  ago Annual physical exam   [facility] [name], PA-C   9 months ago COVID-19   [facility] [name], PA-C   11 months ago Memory changes   [place] [facility] [name], PA-C   1 year ago Depression, major, single episode, moderate [facility])   [place] [facility] [name], PA-C   1 year ago Adult hypothyroidism   [place] [facility] [name], PA-C       Future Appointments             In 2 months Pardue, Monico Blitz, DO [place] [facility]   In 4 months Elie Goody, MD [facility]            Passed - Bone Mineral Density or Dexa Scan completed in the last 2 years

## 2023-04-27 DIAGNOSIS — H353131 Nonexudative age-related macular degeneration, bilateral, early dry stage: Secondary | ICD-10-CM | POA: Diagnosis not present

## 2023-04-27 DIAGNOSIS — H02889 Meibomian gland dysfunction of unspecified eye, unspecified eyelid: Secondary | ICD-10-CM | POA: Diagnosis not present

## 2023-04-27 DIAGNOSIS — M3501 Sicca syndrome with keratoconjunctivitis: Secondary | ICD-10-CM | POA: Diagnosis not present

## 2023-04-27 DIAGNOSIS — Z0100 Encounter for examination of eyes and vision without abnormal findings: Secondary | ICD-10-CM | POA: Diagnosis not present

## 2023-04-27 DIAGNOSIS — Z961 Presence of intraocular lens: Secondary | ICD-10-CM | POA: Diagnosis not present

## 2023-04-27 DIAGNOSIS — H35379 Puckering of macula, unspecified eye: Secondary | ICD-10-CM | POA: Diagnosis not present

## 2023-04-28 ENCOUNTER — Other Ambulatory Visit: Payer: Self-pay | Admitting: Physician Assistant

## 2023-04-28 ENCOUNTER — Other Ambulatory Visit: Payer: Self-pay | Admitting: Family Medicine

## 2023-04-28 DIAGNOSIS — E7800 Pure hypercholesterolemia, unspecified: Secondary | ICD-10-CM

## 2023-04-28 DIAGNOSIS — M810 Age-related osteoporosis without current pathological fracture: Secondary | ICD-10-CM

## 2023-04-28 DIAGNOSIS — F32A Depression, unspecified: Secondary | ICD-10-CM

## 2023-04-28 NOTE — Telephone Encounter (Signed)
Rx 07/24/22- Atorvastatin #90 3RF                     Fluoxetine #180 3RF Requested Prescriptions  Pending Prescriptions Disp Refills   atorvastatin (LIPITOR) 20 MG tablet [Pharmacy Med Name: Atorvastatin Calcium Oral Tablet 20 MG] 90 tablet 3    Sig: TAKE 1 TABLET AT BEDTIME     Cardiovascular:  Antilipid - Statins Failed - 04/28/2023 11:48 AM      Failed - Lipid Panel in normal range within the last 12 months    Cholesterol, Total  Date Value Ref Range Status  10/29/2022 177 100 - 199 mg/dL Final   LDL Chol Calc (NIH)  Date Value Ref Range Status  10/29/2022 93 0 - 99 mg/dL Final   HDL  Date Value Ref Range Status  10/29/2022 71 >39 mg/dL Final   Triglycerides  Date Value Ref Range Status  10/29/2022 67 0 - 149 mg/dL Final         Passed - Patient is not pregnant      Passed - Valid encounter within last 12 months    Recent Outpatient Visits           6 months ago Annual physical exam   [place] [facility] [name], PA-C   11 months ago COVID-19   [facility] [name], PA-C   1 year ago Memory changes   [place] [facility] [place], [place], PA-C   1 year ago Depression, major, single episode, moderate (HCC)   [place] [facility] [name], PA-C   1 year ago Adult hypothyroidism   [place] [facility] [name], PA-C       Future Appointments             In 1 week Pardue, Monico Blitz, DO [place] [facility]   In 1 month Elie Goody, MD [facility]             FLUoxetine (PROZAC) 20 MG capsule [Pharmacy Med Name: FLUoxetine HCl Oral Capsule 20 MG] 180 capsule 3    Sig: TAKE 2 CAPSULES EVERY DAY     Psychiatry:  Antidepressants - SSRI Failed - 04/28/2023 11:48 AM      Failed - Valid encounter within last 6 months    Recent Outpatient Visits           6 months ago Annual physical  exam   [facility] [name], PA-C   11 months ago COVID-19   [name], [facility]-C   1 year ago Memory changes   [place] [facility] [name], PA-C   1 year ago Depression, major, single episode, moderate [facility])   [place] [facility] [name], PA-C   1 year ago Adult hypothyroidism   [place] [facility] [name], PA-C       Future Appointments             In 1 week Pardue, Monico Blitz, DO [place] [facility]   In 1 month Elie Goody, MD [facility]            Passed - Completed PHQ-2 or PHQ-9 in the last 360 days

## 2023-04-29 ENCOUNTER — Ambulatory Visit: Payer: Medicare HMO | Admitting: Family Medicine

## 2023-05-07 ENCOUNTER — Encounter: Payer: Self-pay | Admitting: Family Medicine

## 2023-05-07 ENCOUNTER — Ambulatory Visit (INDEPENDENT_AMBULATORY_CARE_PROVIDER_SITE_OTHER): Payer: Medicare HMO | Admitting: Family Medicine

## 2023-05-07 VITALS — Temp 98.6°F | Resp 16 | Ht 62.0 in | Wt 104.4 lb

## 2023-05-07 DIAGNOSIS — M810 Age-related osteoporosis without current pathological fracture: Secondary | ICD-10-CM | POA: Diagnosis not present

## 2023-05-07 DIAGNOSIS — R413 Other amnesia: Secondary | ICD-10-CM | POA: Diagnosis not present

## 2023-05-07 DIAGNOSIS — E039 Hypothyroidism, unspecified: Secondary | ICD-10-CM | POA: Diagnosis not present

## 2023-05-07 DIAGNOSIS — F419 Anxiety disorder, unspecified: Secondary | ICD-10-CM | POA: Diagnosis not present

## 2023-05-07 DIAGNOSIS — R42 Dizziness and giddiness: Secondary | ICD-10-CM

## 2023-05-07 DIAGNOSIS — K219 Gastro-esophageal reflux disease without esophagitis: Secondary | ICD-10-CM | POA: Diagnosis not present

## 2023-05-07 DIAGNOSIS — G63 Polyneuropathy in diseases classified elsewhere: Secondary | ICD-10-CM | POA: Diagnosis not present

## 2023-05-07 DIAGNOSIS — F339 Major depressive disorder, recurrent, unspecified: Secondary | ICD-10-CM

## 2023-05-07 DIAGNOSIS — R2689 Other abnormalities of gait and mobility: Secondary | ICD-10-CM | POA: Diagnosis not present

## 2023-05-07 NOTE — Patient Instructions (Addendum)
Go ahead and get 2nd Shingrix (shingles) vaccine at [facility].  DEXA scan (bone density screening) due in February 2025, then follow up in clinic after.

## 2023-05-07 NOTE — Progress Notes (Unsigned)
Established patient visit   Patient: Hailey Cain   DOB: 06/09/1945   77 y.o. Female  MRN: [id number] Visit Date: 05/07/2023  Today's healthcare provider: Sherlyn Hay, DO   Chief Complaint  Patient presents with   Medical Management of Chronic Issues   Depression   Subjective    HPI Last annual physical: 10/29/2022  Mental status exam; last scored 26/30 on 03/12/2022  - From previous note "Definitely overlaps with symptoms of anxiety and depression. Pt is managed on prozac for years Discussed referral to psychiatry to discuss additional or new medication that may help all of her symptoms"   - was referred to psych   Balance pro for 1 year  ***  {History (Optional):23778}  Medications: Outpatient Medications Prior to Visit  Medication Sig   alendronate (FOSAMAX) 70 MG tablet TAKE 1 TABLET ONCE A WEEK WITH A FULL GLASS OF WATER   aspirin EC 81 MG tablet Take 81 mg by mouth daily. Swallow whole.   atorvastatin (LIPITOR) 20 MG tablet TAKE 1 TABLET AT BEDTIME   Calcium Carbonate (CALCIUM 600 PO) Take 600 mg by mouth 2 (two) times a week.    Cholecalciferol (VITAMIN D-3) 25 MCG (1000 UT) CAPS Take 1,000 Units by mouth daily.   FLUoxetine (PROZAC) 20 MG capsule TAKE 2 CAPSULES EVERY DAY   gabapentin (NEURONTIN) 100 MG capsule gabapentin 100 mg capsule  TAKE 1 CAPSULE BY MOUTH THREE TIMES DAILY   levothyroxine (SYNTHROID) 75 MCG tablet Take 1 tablet (75 mcg total) by mouth daily.   Multiple Vitamins-Minerals (EMERGEN-C IMMUNE PO) Take 1 tablet by mouth daily as needed (immunity boost).    omeprazole (PRILOSEC) 20 MG capsule TAKE 1 CAPSULE EVERY DAY   vitamin B-12 (CYANOCOBALAMIN) 500 MCG tablet Take 500 mcg by mouth daily.   [DISCONTINUED] aspirin EC 325 MG tablet Take 325 mg by mouth daily.   [DISCONTINUED] clobetasol ointment (TEMOVATE) 0.05 % Apply to affected skin twice a day, Avoid applying to face, groin, and axilla. Use as directed. Long-term use can  cause thinning of the skin.   [DISCONTINUED] mupirocin ointment (BACTROBAN) 2 % Apply 1 Application topically daily. With dressing changes   No facility-administered medications prior to visit.    Review of Systems  Constitutional:  Negative for appetite change, chills, fatigue and fever.  HENT:  Negative for congestion and rhinorrhea.   Respiratory:  Negative for chest tightness and shortness of breath.   Cardiovascular:  Negative for chest pain and palpitations.  Gastrointestinal:  Negative for abdominal pain, nausea and vomiting.  Neurological:  Positive for dizziness. Negative for weakness, light-headedness and headaches.    {Insert previous labs (optional):23779} {See past labs  Heme  Chem  Endocrine  Serology  Results Review (optional):1}   Objective    Temp 98.6 F (37 C)   Resp 16   Ht 5\' 2"  (1.575 m)   Wt 104 lb 6.4 oz (47.4 kg)   SpO2 97%   BMI 19.10 kg/m  {Insert last BP/Wt (optional):23777}{See vitals history (optional):1}   Physical Exam Vitals and nursing note reviewed.  Constitutional:      General: She is not in acute distress.    Appearance: Normal appearance.  HENT:     Head: Normocephalic and atraumatic.  Eyes:     General: No scleral icterus.    Conjunctiva/sclera: Conjunctivae normal.  Cardiovascular:     Rate and Rhythm: Normal rate.  Pulmonary:     Effort: Pulmonary effort is normal.  Neurological:     Mental Status: She is alert and oriented to person, place, and time. Mental status is at baseline.  Psychiatric:        Mood and Affect: Mood normal.        Behavior: Behavior normal.      No results found for any visits on 05/07/23.  Assessment & Plan    Recurrent depression (HCC)  Anxiety  Memory changes  Impairment of balance -     Ambulatory referral to Physical Therapy  Adult hypothyroidism -     TSH + free T4  Age-related osteoporosis without current pathological fracture Assessment & Plan: 06/26/2021: Femur Neck  Left is 0.675 g/cm2 with a T-score of -2.6   Dizziness on standing -     Orthostatic vital signs  Recommended cane Offered referral to neuro; declined Does hit head intermittently   Orthostatic VS for the past 72 hrs (Last 3 readings):  Orthostatic BP Orthostatic Pulse  05/07/23 0913 132/63 79    Orthostatics -> MRI if negative ***  Return in about 2 months (around 07/08/2023) for After DEXA scan, MSE.      I discussed the assessment and treatment plan with the patient  The patient was provided an opportunity to ask questions and all were answered. The patient agreed with the plan and demonstrated an understanding of the instructions.   The patient was advised to call back or seek an in-person evaluation if the symptoms worsen or if the condition fails to improve as anticipated.    Sherlyn Hay, DO  [facility] [phone] (phone) [phone] (fax)  [facility]

## 2023-05-07 NOTE — Assessment & Plan Note (Signed)
06/26/2021: Femur Neck Left is 0.675 g/cm2 with a T-score of -2.6

## 2023-05-08 LAB — TSH+FREE T4
Free T4: 1.19 ng/dL (ref 0.82–1.77)
TSH: 0.282 u[IU]/mL — ABNORMAL LOW (ref 0.450–4.500)

## 2023-05-10 DIAGNOSIS — R42 Dizziness and giddiness: Secondary | ICD-10-CM | POA: Insufficient documentation

## 2023-05-10 DIAGNOSIS — G63 Polyneuropathy in diseases classified elsewhere: Secondary | ICD-10-CM | POA: Insufficient documentation

## 2023-05-10 DIAGNOSIS — R2689 Other abnormalities of gait and mobility: Secondary | ICD-10-CM | POA: Insufficient documentation

## 2023-05-10 NOTE — Assessment & Plan Note (Signed)
Per patient preference, will continue fluoxetine 40 mg daily

## 2023-05-10 NOTE — Assessment & Plan Note (Signed)
Experiences indigestion, possibly related to fluoxetine. Currently taking omeprazole. - Continue omeprazole

## 2023-05-10 NOTE — Assessment & Plan Note (Signed)
Reports significant improvement in symptoms with current medication regimen. Screening scores are low (PHQ9 2 and GAD7 0). Continues on fluoxetine but experiences indigestion. Declined psychiatry follow-up due to logistical challenges. Prefers self-help books for management. - Continue fluoxetine

## 2023-05-10 NOTE — Assessment & Plan Note (Addendum)
Orthostatics negative.  Concern exists for possible intracranial pathology.  Patient's last MRI brain was done in 2015.  Will repeat today to evaluate for changes, especially in the setting of multiple falls with reported head trauma. - Recommended patient use cane to help prevent her falls.  She may need to progress to a walker instead. - Offered referral to neurology, which patient declined at this time.

## 2023-05-10 NOTE — Assessment & Plan Note (Signed)
Reports multiple falls, approximately twice a month, often associated with dizziness and balance issues. Lives alone. History of physical therapy with temporary improvement. Falls often related to environmental factors and dizziness. - Refer to physical therapy for balance exercises

## 2023-05-10 NOTE — Assessment & Plan Note (Signed)
Chronic ongoing memory changes in setting of worsening balance impairment and dizziness.  Will evaluate with MRI brain as noted.

## 2023-05-10 NOTE — Assessment & Plan Note (Signed)
Reports numbness in feet, described as feeling like wearing socks. Currently taking gabapentin, which causes daytime sleepiness. Previously adjusted dose to nighttime. Prefers lower dose for better tolerance. - Continue gabapentin at nighttime - Monitor symptoms and adjust dosage as needed

## 2023-05-10 NOTE — Assessment & Plan Note (Signed)
On levothyroxine. Last blood work in June showed well-managed levels. Plan to recheck thyroid function. - Order TSH

## 2023-05-18 ENCOUNTER — Other Ambulatory Visit: Payer: Self-pay | Admitting: Family Medicine

## 2023-05-18 MED ORDER — LEVOTHYROXINE SODIUM 50 MCG PO TABS: 50.0000 ug | ORAL_TABLET | Freq: Every day | ORAL | 0 refills | Status: DC

## 2023-05-18 MED ORDER — LEVOTHYROXINE SODIUM 25 MCG PO TABS: 12.5000 ug | ORAL_TABLET | Freq: Every day | ORAL | 0 refills | Status: DC

## 2023-06-07 ENCOUNTER — Encounter: Payer: Self-pay | Admitting: Physical Therapy

## 2023-06-07 ENCOUNTER — Ambulatory Visit: Payer: Medicare Other | Attending: Family Medicine | Admitting: Physical Therapy

## 2023-06-07 DIAGNOSIS — M6281 Muscle weakness (generalized): Secondary | ICD-10-CM | POA: Diagnosis not present

## 2023-06-07 DIAGNOSIS — R2689 Other abnormalities of gait and mobility: Secondary | ICD-10-CM | POA: Diagnosis not present

## 2023-06-07 DIAGNOSIS — R29898 Other symptoms and signs involving the musculoskeletal system: Secondary | ICD-10-CM | POA: Diagnosis not present

## 2023-06-07 DIAGNOSIS — R2681 Unsteadiness on feet: Secondary | ICD-10-CM | POA: Diagnosis not present

## 2023-06-07 NOTE — Therapy (Signed)
OUTPATIENT PHYSICAL THERAPY LOWER EXTREMITY EVALUATION   Patient Name: Hailey Cain MRN: [id number] DOB:[birth year], 78 y.o., female Today's Date: 06/08/2023  END OF SESSION:  PT End of Session - 06/08/23 0957     Visit Number 1    Number of Visits 12    Date for PT Re-Evaluation 07/19/23    PT Start Time 1108    PT Stop Time 1202    PT Time Calculation (min) 54 min             Past Medical History:  Diagnosis Date   Acid reflux 12/30/2006   Actinic keratosis    Adult hypothyroidism 01/13/2007   Allergic rhinitis 09/07/2014   Anxiety 12/10/2014   Basal cell carcinoma 2013   left nose, > 10 years ago   BCC (basal cell carcinoma), arm, right 12/14/2022   Right medial arm. Nodular pattern. Excised 01/20/2023   Bronchitis 05/17/2015   Cancer of cheek (HCC) 07/04/2007   Depression, neurotic 12/30/2006   Dysplastic nevus 12/16/2021   left lower chest moderate atypia   Hyperlipidemia    Prediabetes 09/07/2014   RAD (reactive airway disease) 09/07/2014   Small bowel obstruction (HCC) 01/31/2017   Past Surgical History:  Procedure Laterality Date   ABDOMINAL HYSTERECTOMY  1988   heavy, irregular menses; ovaries intact   AUGMENTATION MAMMAPLASTY Bilateral    silicone   BREAST SURGERY  1970   augmentation   CATARACT EXTRACTION Bilateral 2013   Dr. Vonna Kotyk.  [place].  OD-Distance.   EYE SURGERY     cataract extraction   EYE SURGERY  2007   LASIK   MEMBRANE PEEL Left 05/25/2019   Procedure: MEMBRANE PEEL;  Surgeon: Rennis Chris, MD;  Location: [facility] OR;  Service: Ophthalmology;  Laterality: Left;   PARS PLANA VITRECTOMY Left 05/25/2019   Procedure: PARS PLANA VITRECTOMY WITH 25 GAUGE;  Surgeon: Rennis Chris, MD;  Location: [facility] OR;  Service: Ophthalmology;  Laterality: Left;   TONSILLECTOMY AND ADENOIDECTOMY  1950   TUBAL LIGATION     YAG LASER APPLICATION Left 03/24/2019   Dr. Rennis Chris   Patient Active Problem List   Diagnosis Date Noted   Impairment of  balance 05/10/2023   Dizziness on standing 05/10/2023   Polyneuropathy associated with underlying disease (HCC) 05/10/2023   Memory changes 03/12/2022   Recurrent depression (HCC) 11/12/2021   Skin lesion 11/12/2021   Stiff back 06/19/2021   Low back pain 06/06/2021   Numbness of lower limb 06/06/2021   History of small bowel obstruction 01/31/2017   Anxiety 12/10/2014   Osteoporosis 11/06/2014   Allergic rhinitis 09/07/2014   Lacunar infarction (HCC) 09/07/2014   RAD (reactive airway disease) 09/07/2014   Prediabetes 09/07/2014   Cancer of cheek (HCC) 07/04/2007   Adult hypothyroidism 01/13/2007   Narrowing of intervertebral disc space 12/30/2006   GERD (gastroesophageal reflux disease) 12/30/2006   Fam hx-ischem heart disease 12/30/2006   History of tobacco use 12/30/2006   Hypercholesterolemia without hypertriglyceridemia 12/30/2006    PCP:   Sherlyn Hay, DO    REFERRING PROVIDER:   Sherlyn Hay, DO    REFERRING DIAG: Balance concerns and global loss of strength  THERAPY DIAG:  Other abnormalities of gait and mobility  Muscle weakness (generalized)  General unsteadiness  Impaired strength of lower extremity  Rationale for Evaluation and Treatment: Rehabilitation  ONSET DATE: 6 Months ago, on and off episodes of falling  SUBJECTIVE:   SUBJECTIVE STATEMENT:  Patient arrives to PT clinic on time and  prepared for evaluation. Pt. Explains that over the past 6 months they have fallen 1-2/month that have made them concerned for their safety when ambulating. Pt. Helps their daughter take care of her two grandchildren and states she would like to make sure she can keep doing so. Pt. Presents concerns of having balance issues in the past and wanting to work towards increasing their leg and upper body strength. Pt. Describes that their past falls have always been forward, and have been accompanied by a feeling of "dizziness." Pt. Expresses that they want to regain  confidence with community ambulation and continue to be independent with all ADLs.   PERTINENT HISTORY: See PMH in records PAIN:  Are you having pain? No  PRECAUTIONS: None  RED FLAGS: None   WEIGHT BEARING RESTRICTIONS: No  FALLS:  Has patient fallen in last 6 months? Yes. Number of falls 3  LIVING ENVIRONMENT: Lives with: lives with their family and lives alone Lives in: Mobile home Stairs: No Has following equipment at home: None  OCCUPATION: Retired  PLOF: Independent  PATIENT GOALS:   Regain confidence walking independently with grandchildren.  NEXT MD VISIT: NA  OBJECTIVE:   PT performed vestibular screening using Dix Halpike maneuver, no significant findings noted.  Pt. Completed BERG and LEFS  Pt. Demonstrated 100 ft. Of unassisted walking and showed a decreased stride length.   Pt. Showed a decreased arm-swing with cautious gait.   Pt. Was strength tested for bilateral LE (see chart)    Note: Objective measures were completed at Evaluation unless otherwise noted.  DIAGNOSTIC FINDINGS:   Brain MRI ordered  PATIENT SURVEYS:  LEFS 63 BERG 54/56  COGNITION: Overall cognitive status: Within functional limits for tasks assessed     SENSATION: WFL  EDEMA:   No presence  MUSCLE LENGTH:  NT  POSTURE: rounded shoulders  PALPATION:   Bilateral medial and lateral ankle, WNL  LOWER EXTREMITY MMT:  MMT Right eval Left eval  Hip flexion 4 4  Hip extension 4 4  Hip abduction 3 3  Hip adduction 4 4  Hip internal rotation 4 4  Hip external rotation 4 4  Knee flexion 4 4  Knee extension 4 4  Ankle dorsiflexion 3 3  Ankle plantarflexion 4- 4  Ankle inversion 4- 4  Ankle eversion 4- 4   (Blank rows = not tested)  LOWER EXTREMITY SPECIAL TESTS:  NT  FUNCTIONAL TESTS:  Berg Balance Scale: 54/56  GAIT: Distance walked: 150 ft. In clinic hallway Assistive device utilized: None Level of assistance: Complete Independence Comments:  Shortened stride length.                                                                                                                               TREATMENT DATE: 06/07/2023  See evaluation/ no HEP at this time    PATIENT EDUCATION:  Education details: Intrinsic foot muscle activation, proximal hip musculature activation, pelvic tilt correction education  Person educated: Patient Education method: Explanation Education comprehension: verbalized understanding  HOME EXERCISE PROGRAM: Deferred to next visit  ASSESSMENT:  CLINICAL IMPRESSION: Patient is a 78 y.o. female who was seen today for physical therapy evaluation and treatment for c/o balance issues/fear of falling. Pt. Is independent with all ADLs/IADLs and enjoys helping take care of her two grandchildren. Pt. Has a positive attitude towards beginning a resistance training exercise program with a focus on LE motor coordination. Pt. Would like to be able to walk in the woods with their grandchildren without the fear of falling. Pt. Will benefit from skilled PT services to increase global proprioception and motor coordination to decrease their fear of falling.  OBJECTIVE IMPAIRMENTS: decreased balance.   ACTIVITY LIMITATIONS: Walking in rough terrain  PARTICIPATION LIMITATIONS: community activity  PERSONAL FACTORS: Fitness are also affecting patient's functional outcome.   REHAB POTENTIAL: Good  CLINICAL DECISION MAKING: Stable/uncomplicated  EVALUATION COMPLEXITY: Moderate   GOALS: Goals reviewed with patient? Yes  SHORT TERM GOALS: Target date: 06/29/2023  Patient will be able to stand on AirEx pad for 20 seconds without LOB to demonstrate increased LE muscle coordination/proprioception.  Baseline: 8 Seconds Goal status: INITIAL  2.  Patient will increase standing functional reach measure from 4" to 6" as tested on the BERG balance scale to demonstrate improved UE muscle coordination.  Baseline: 4" Goal  status: INITIAL    LONG TERM GOALS: Target date: 07/19/2023  Pt. Will be able to stand in eyes-closed tandem for greater than 15 seconds to demonstrate increased global proprioception.  Baseline: 7 Seconds Goal status: INITIAL  2.  Pt. Will be able to walk one mile on a treadmill with PT provided random perturbation to demonstrate increased confidence when walking through difficult terrains.   Baseline: TBD Goal status: INITIAL  3.  Pt. Will increase proximal hip MMT scores from 3/5 to 4/5 (ABD, ER, IR, EXT) to demonstrate increased global LE strength and motor coordination.  Baseline: See above Goal status: INITIAL  4.  Pt. Will increase periscapular muscular strength to 4/5 to increase confidence using B UE for fall recovery strategies.  Baseline: NT Goal status: INITIAL   PLAN:  PT FREQUENCY: 2x/week  PT DURATION: 6 weeks  PLANNED INTERVENTIONS: 97110-Therapeutic exercises, 97530- Therapeutic activity, [id number]- Neuromuscular re-education, 97535- Self Care, 82956- Manual therapy, and 97116- Gait training  PLAN FOR NEXT SESSION: Assign HEP focused on global resistance training  Cammie Mcgee, PT, DPT # [phone] 06/08/2023, 9:58 AM   [place] Cage, SPT

## 2023-06-09 ENCOUNTER — Encounter: Payer: Self-pay | Admitting: Physical Therapy

## 2023-06-09 ENCOUNTER — Ambulatory Visit: Payer: Medicare Other | Admitting: Physical Therapy

## 2023-06-09 DIAGNOSIS — M6281 Muscle weakness (generalized): Secondary | ICD-10-CM | POA: Diagnosis not present

## 2023-06-09 DIAGNOSIS — R2681 Unsteadiness on feet: Secondary | ICD-10-CM

## 2023-06-09 DIAGNOSIS — R2689 Other abnormalities of gait and mobility: Secondary | ICD-10-CM | POA: Diagnosis not present

## 2023-06-09 DIAGNOSIS — R29898 Other symptoms and signs involving the musculoskeletal system: Secondary | ICD-10-CM

## 2023-06-09 NOTE — Therapy (Signed)
OUTPATIENT PHYSICAL THERAPY LOWER EXTREMITY TREATMENT   Patient Name: Hailey Cain MRN: [id number] DOB:[birth year], 78 y.o., female Today's Date: 06/09/2023  END OF SESSION:  PT End of Session - 06/09/23 0857     Visit Number 2    Number of Visits 12    Date for PT Re-Evaluation 07/19/23    PT Start Time 0857    PT Stop Time 0950    PT Time Calculation (min) 53 min             Past Medical History:  Diagnosis Date   Acid reflux 12/30/2006   Actinic keratosis    Adult hypothyroidism 01/13/2007   Allergic rhinitis 09/07/2014   Anxiety 12/10/2014   Basal cell carcinoma 2013   left nose, > 10 years ago   BCC (basal cell carcinoma), arm, right 12/14/2022   Right medial arm. Nodular pattern. Excised 01/20/2023   Bronchitis 05/17/2015   Cancer of cheek (HCC) 07/04/2007   Depression, neurotic 12/30/2006   Dysplastic nevus 12/16/2021   left lower chest moderate atypia   Hyperlipidemia    Prediabetes 09/07/2014   RAD (reactive airway disease) 09/07/2014   Small bowel obstruction (HCC) 01/31/2017   Past Surgical History:  Procedure Laterality Date   ABDOMINAL HYSTERECTOMY  1988   heavy, irregular menses; ovaries intact   AUGMENTATION MAMMAPLASTY Bilateral    silicone   BREAST SURGERY  1970   augmentation   CATARACT EXTRACTION Bilateral 2013   Dr. Vonna Kotyk.  [place].  [facility]-Distance.   EYE SURGERY     cataract extraction   EYE SURGERY  2007   LASIK   MEMBRANE PEEL Left 05/25/2019   Procedure: MEMBRANE PEEL;  Surgeon: Rennis Chris, MD;  Location: [facility] OR;  Service: Ophthalmology;  Laterality: Left;   PARS PLANA VITRECTOMY Left 05/25/2019   Procedure: PARS PLANA VITRECTOMY WITH 25 GAUGE;  Surgeon: Rennis Chris, MD;  Location: [facility] OR;  Service: Ophthalmology;  Laterality: Left;   TONSILLECTOMY AND ADENOIDECTOMY  1950   TUBAL LIGATION     YAG LASER APPLICATION Left 03/24/2019   Dr. Rennis Chris   Patient Active Problem List   Diagnosis Date Noted   Impairment of balance  05/10/2023   Dizziness on standing 05/10/2023   Polyneuropathy associated with underlying disease (HCC) 05/10/2023   Memory changes 03/12/2022   Recurrent depression (HCC) 11/12/2021   Skin lesion 11/12/2021   Stiff back 06/19/2021   Low back pain 06/06/2021   Numbness of lower limb 06/06/2021   History of small bowel obstruction 01/31/2017   Anxiety 12/10/2014   Osteoporosis 11/06/2014   Allergic rhinitis 09/07/2014   Lacunar infarction (HCC) 09/07/2014   RAD (reactive airway disease) 09/07/2014   Prediabetes 09/07/2014   Cancer of cheek (HCC) 07/04/2007   Adult hypothyroidism 01/13/2007   Narrowing of intervertebral disc space 12/30/2006   GERD (gastroesophageal reflux disease) 12/30/2006   Fam hx-ischem heart disease 12/30/2006   History of tobacco use 12/30/2006   Hypercholesterolemia without hypertriglyceridemia 12/30/2006    PCP:   Sherlyn Hay, DO    REFERRING PROVIDER:   Sherlyn Hay, DO    REFERRING DIAG: Balance concerns and global loss of strength  THERAPY DIAG:  Muscle weakness (generalized)  General unsteadiness  Impaired strength of lower extremity  Other abnormalities of gait and mobility  Rationale for Evaluation and Treatment: Rehabilitation  ONSET DATE: 6 Months ago, on and off episodes of falling  SUBJECTIVE:   SUBJECTIVE STATEMENT:  Patient arrives to PT clinic on time and  prepared for evaluation. Pt. Explains that over the past 6 months they have fallen 1-2/month that have made them concerned for their safety when ambulating. Pt. Helps their daughter take care of her two grandchildren and states she would like to make sure she can keep doing so. Pt. Presents concerns of having balance issues in the past and wanting to work towards increasing their leg and upper body strength. Pt. Describes that their past falls have always been forward, and have been accompanied by a feeling of "dizziness." Pt. Expresses that they want to regain  confidence with community ambulation and continue to be independent with all ADLs.   PERTINENT HISTORY: See PMH in records PAIN:  Are you having pain? No  PRECAUTIONS: None  RED FLAGS: None   WEIGHT BEARING RESTRICTIONS: No  FALLS:  Has patient fallen in last 6 months? Yes. Number of falls 3  LIVING ENVIRONMENT: Lives with: lives with their family and lives alone Lives in: Mobile home Stairs: No Has following equipment at home: None  OCCUPATION: Retired  PLOF: Independent  PATIENT GOALS:   Regain confidence walking independently with grandchildren.  NEXT MD VISIT: NA  OBJECTIVE:   PT performed vestibular screening using Dix Halpike maneuver, no significant findings noted.  Pt. Completed BERG and LEFS  Pt. Demonstrated 100 ft. Of unassisted walking and showed a decreased stride length.   Pt. Showed a decreased arm-swing with cautious gait.   Pt. Was strength tested for bilateral LE (see chart)    Note: Objective measures were completed at Evaluation unless otherwise noted.  DIAGNOSTIC FINDINGS:   Brain MRI ordered  PATIENT SURVEYS:  LEFS 63 BERG 54/56  COGNITION: Overall cognitive status: Within functional limits for tasks assessed     SENSATION: WFL  EDEMA:   No presence  MUSCLE LENGTH:  NT  POSTURE: rounded shoulders  PALPATION:   Bilateral medial and lateral ankle, WNL  LOWER EXTREMITY MMT:  MMT Right eval Left eval  Hip flexion 4 4  Hip extension 4 4  Hip abduction 3 3  Hip adduction 4 4  Hip internal rotation 4 4  Hip external rotation 4 4  Knee flexion 4 4  Knee extension 4 4  Ankle dorsiflexion 3 3  Ankle plantarflexion 4- 4  Ankle inversion 4- 4  Ankle eversion 4- 4   (Blank rows = not tested)  LOWER EXTREMITY SPECIAL TESTS:  NT  FUNCTIONAL TESTS:  Berg Balance Scale: 54/56  GAIT: Distance walked: 150 ft. In clinic hallway Assistive device utilized: None Level of assistance: Complete Independence Comments:  Shortened stride length.                                                                                                                               TREATMENT DATE: 06/09/2023  Subjective:  Pt. Arrives to treatment session on time, prepared, and NPS 0/10. Pt. Explains that they had an episode of dizziness this morning after getting out  of bed that subsided after sitting back down. Pt. Reiterates that they would like to improve their grip strength, balance, and UE/LE strength through physical therapy. Pt. Elaborates that they enjoy helping taking care of their grandchildren and that they don't want to scare them by falling in public.   Therapeutic Exercise:  Gait Training:  //-Bar AirEx Pad tandem balance 5x30 seconds  //-Bar AirEx Balance Beam walking, 8 laps (5 instances of LOB)  Nautilus Resisted walking 20# forward/backwards/lateral 10x each direction. Cue to control resistance with LE rather than lumbar spine)  Seated YTB scapular W's anchored around legs 3x15  Seated YTB mid-row anchored to //-bars. 3x15  YTB Monster walks 6 laps of 30 ft. Cuing for increase hip/knee flexion and slowing down movement.   YTB Lateral stepping 6 laps of 30 ft. Cuing for maintaining neutral pelvis.     PATIENT EDUCATION:  Education details: Intrinsic foot muscle activation, proximal hip musculature activation, pelvic tilt correction education Person educated: Patient Education method: Explanation Education comprehension: verbalized understanding  HOME EXERCISE PROGRAM:  Access Code: [email or web address] URL: [email or web address] Date: 06/09/2023 Prepared by: Maylon Peppers  Exercises - Shoulder External Rotation and Scapular Retraction with Resistance  - 2-3 x daily - 5-7 x weekly - 3 sets - 10 reps - Standing Shoulder External Rotation with Resistance  - 2-3 x daily - 5-7 x weekly - 3 sets - 10 reps - Standing Row with Anchored Resistance  - 2-3 x daily - 5-7 x weekly - 3 sets - 10  reps - Shoulder W - External Rotation with Resistance  - 2-3 x daily - 5-7 x weekly - 3 sets - 10 reps - Standing 3-Way Leg Reach with Resistance at Ankles and Counter Support  - 2-3 x daily - 5-7 x weekly - 3 sets - 10 reps - Standing Hip Abduction Kicks  - 2-3 x daily - 5-7 x weekly - 3 sets - 10 reps   ASSESSMENT:  CLINICAL IMPRESSION:  Patient arrives for treatment on time and in good spirits. Pt. Restated their primary goals for physical therapy and emphasized the importance of being able to help care for their grandchildren. Pt. Demonstrates difficulty with balance in the sagittal plane during resistance walking, and improved with cuing to increase hip/knee flexion. Pt. Occasionally creates contact between their medial malleoli and forefoot during swing phase that results in occasional tripping. Pt. Demonstrates weakness in hip abduction/extension during balance activities. Today's treatment session focused on establishing a global strength HEP and beginning balance exercises related to LE proprioception. Pt. Will benefit from skilled PT services to increase global proprioception and motor coordination to decrease their fear of falling.  OBJECTIVE IMPAIRMENTS: decreased balance.   ACTIVITY LIMITATIONS: Walking in rough terrain  PARTICIPATION LIMITATIONS: community activity  PERSONAL FACTORS: Fitness are also affecting patient's functional outcome.   REHAB POTENTIAL: Good  CLINICAL DECISION MAKING: Stable/uncomplicated  EVALUATION COMPLEXITY: Moderate   GOALS: Goals reviewed with patient? Yes  SHORT TERM GOALS: Target date: 06/29/2023  Patient will be able to stand on AirEx pad for 20 seconds without LOB to demonstrate increased LE muscle coordination/proprioception.  Baseline: 8 Seconds Goal status: INITIAL  2.  Patient will increase standing functional reach measure from 4" to 6" as tested on the BERG balance scale to demonstrate improved UE muscle  coordination.  Baseline: 4" Goal status: INITIAL    LONG TERM GOALS: Target date: 07/19/2023  Pt. Will be able to stand in eyes-closed tandem for greater than 15 seconds to demonstrate  increased global proprioception.  Baseline: 7 Seconds Goal status: INITIAL  2.  Pt. Will be able to walk one mile on a treadmill with PT provided random perturbation to demonstrate increased confidence when walking through difficult terrains.   Baseline: TBD Goal status: INITIAL  3.  Pt. Will increase proximal hip MMT scores from 3/5 to 4/5 (ABD, ER, IR, EXT) to demonstrate increased global LE strength and motor coordination.  Baseline: See above Goal status: INITIAL  4.  Pt. Will increase periscapular muscular strength to 4/5 to increase confidence using B UE for fall recovery strategies.  Baseline: NT Goal status: INITIAL   PLAN:  PT FREQUENCY: 2x/week  PT DURATION: 6 weeks  PLANNED INTERVENTIONS: 97110-Therapeutic exercises, 97530- Therapeutic activity, [id number]- Neuromuscular re-education, 97535- Self Care, 16109- Manual therapy, and 97116- Gait training  PLAN FOR NEXT SESSION: Incorporate blaze pods balance and proprioception activities to assess global reaction time and motor coordination deficits.  Maylon Peppers, PT, DPT Physical Therapist - [facility]  06/09/2023, 10:51 AM   Milford Cage, SPT

## 2023-06-14 ENCOUNTER — Ambulatory Visit: Payer: Medicare Other | Attending: Family Medicine | Admitting: Physical Therapy

## 2023-06-14 DIAGNOSIS — R2689 Other abnormalities of gait and mobility: Secondary | ICD-10-CM | POA: Insufficient documentation

## 2023-06-14 DIAGNOSIS — R29898 Other symptoms and signs involving the musculoskeletal system: Secondary | ICD-10-CM | POA: Insufficient documentation

## 2023-06-14 DIAGNOSIS — R2681 Unsteadiness on feet: Secondary | ICD-10-CM | POA: Diagnosis not present

## 2023-06-14 DIAGNOSIS — M6281 Muscle weakness (generalized): Secondary | ICD-10-CM | POA: Diagnosis not present

## 2023-06-14 NOTE — Therapy (Signed)
OUTPATIENT PHYSICAL THERAPY LOWER EXTREMITY TREATMENT   Patient Name: Hailey Cain MRN: [id number] DOB:10/05/1945, 78 y.o., female Today's Date: 06/14/2023  END OF SESSION:  PT End of Session - 06/14/23 0906     Visit Number 3    Number of Visits 12    Date for PT Re-Evaluation 07/19/23    PT Start Time 0906    PT Stop Time 0950    PT Time Calculation (min) 44 min             Past Medical History:  Diagnosis Date   Acid reflux 12/30/2006   Actinic keratosis    Adult hypothyroidism 01/13/2007   Allergic rhinitis 09/07/2014   Anxiety 12/10/2014   Basal cell carcinoma 2013   left nose, > 10 years ago   BCC (basal cell carcinoma), arm, right 12/14/2022   Right medial arm. Nodular pattern. Excised 01/20/2023   Bronchitis 05/17/2015   Cancer of cheek (HCC) 07/04/2007   Depression, neurotic 12/30/2006   Dysplastic nevus 12/16/2021   left lower chest moderate atypia   Hyperlipidemia    Prediabetes 09/07/2014   RAD (reactive airway disease) 09/07/2014   Small bowel obstruction (HCC) 01/31/2017   Past Surgical History:  Procedure Laterality Date   ABDOMINAL HYSTERECTOMY  1988   heavy, irregular menses; ovaries intact   AUGMENTATION MAMMAPLASTY Bilateral    silicone   BREAST SURGERY  1970   augmentation   CATARACT EXTRACTION Bilateral 2013   Dr. Vonna Kotyk.  [place].  OD-Distance.   EYE SURGERY     cataract extraction   EYE SURGERY  2007   LASIK   MEMBRANE PEEL Left 05/25/2019   Procedure: MEMBRANE PEEL;  Surgeon: Rennis Chris, MD;  Location: [facility] OR;  Service: Ophthalmology;  Laterality: Left;   PARS PLANA VITRECTOMY Left 05/25/2019   Procedure: PARS PLANA VITRECTOMY WITH 25 GAUGE;  Surgeon: Rennis Chris, MD;  Location: [facility] OR;  Service: Ophthalmology;  Laterality: Left;   TONSILLECTOMY AND ADENOIDECTOMY  1950   TUBAL LIGATION     YAG LASER APPLICATION Left 03/24/2019   Dr. Rennis Chris   Patient Active Problem List   Diagnosis Date Noted   Impairment of balance  05/10/2023   Dizziness on standing 05/10/2023   Polyneuropathy associated with underlying disease (HCC) 05/10/2023   Memory changes 03/12/2022   Recurrent depression (HCC) 11/12/2021   Skin lesion 11/12/2021   Stiff back 06/19/2021   Low back pain 06/06/2021   Numbness of lower limb 06/06/2021   History of small bowel obstruction 01/31/2017   Anxiety 12/10/2014   Osteoporosis 11/06/2014   Allergic rhinitis 09/07/2014   Lacunar infarction (HCC) 09/07/2014   RAD (reactive airway disease) 09/07/2014   Prediabetes 09/07/2014   Cancer of cheek (HCC) 07/04/2007   Adult hypothyroidism 01/13/2007   Narrowing of intervertebral disc space 12/30/2006   GERD (gastroesophageal reflux disease) 12/30/2006   Fam hx-ischem heart disease 12/30/2006   History of tobacco use 12/30/2006   Hypercholesterolemia without hypertriglyceridemia 12/30/2006    PCP:   Sherlyn Hay, DO    REFERRING PROVIDER:   Sherlyn Hay, DO    REFERRING DIAG: Balance concerns and global loss of strength  THERAPY DIAG:  Muscle weakness (generalized)  General unsteadiness  Impaired strength of lower extremity  Other abnormalities of gait and mobility  Rationale for Evaluation and Treatment: Rehabilitation  ONSET DATE: 6 Months ago, on and off episodes of falling  SUBJECTIVE:   SUBJECTIVE STATEMENT:  Patient arrives to PT clinic on time and  prepared for evaluation. Pt. Explains that over the past 6 months they have fallen 1-2/month that have made them concerned for their safety when ambulating. Pt. Helps their daughter take care of her two grandchildren and states she would like to make sure she can keep doing so. Pt. Presents concerns of having balance issues in the past and wanting to work towards increasing their leg and upper body strength. Pt. Describes that their past falls have always been forward, and have been accompanied by a feeling of "dizziness." Pt. Expresses that they want to regain  confidence with community ambulation and continue to be independent with all ADLs.   PERTINENT HISTORY: See PMH in records PAIN:  Are you having pain? No  PRECAUTIONS: None  RED FLAGS: None   WEIGHT BEARING RESTRICTIONS: No  FALLS:  Has patient fallen in last 6 months? Yes. Number of falls 3  LIVING ENVIRONMENT: Lives with: lives with their family and lives alone Lives in: Mobile home Stairs: No Has following equipment at home: None  OCCUPATION: Retired  PLOF: Independent  PATIENT GOALS:   Regain confidence walking independently with grandchildren.  NEXT MD VISIT: NA  OBJECTIVE:   PT performed vestibular screening using Dix Halpike maneuver, no significant findings noted.  Pt. Completed BERG and LEFS  Pt. Demonstrated 100 ft. Of unassisted walking and showed a decreased stride length.   Pt. Showed a decreased arm-swing with cautious gait.   Pt. Was strength tested for bilateral LE (see chart)    Note: Objective measures were completed at Evaluation unless otherwise noted.  DIAGNOSTIC FINDINGS:   Brain MRI ordered  PATIENT SURVEYS:  LEFS 63 BERG 54/56  COGNITION: Overall cognitive status: Within functional limits for tasks assessed     SENSATION: WFL  EDEMA:   No presence  MUSCLE LENGTH:  NT  POSTURE: rounded shoulders  PALPATION:   Bilateral medial and lateral ankle, WNL  LOWER EXTREMITY MMT:  MMT Right eval Left eval  Hip flexion 4 4  Hip extension 4 4  Hip abduction 3 3  Hip adduction 4 4  Hip internal rotation 4 4  Hip external rotation 4 4  Knee flexion 4 4  Knee extension 4 4  Ankle dorsiflexion 3 3  Ankle plantarflexion 4- 4  Ankle inversion 4- 4  Ankle eversion 4- 4   (Blank rows = not tested)  LOWER EXTREMITY SPECIAL TESTS:  NT  FUNCTIONAL TESTS:  Berg Balance Scale: 54/56  GAIT: Distance walked: 150 ft. In clinic hallway Assistive device utilized: None Level of assistance: Complete Independence Comments:  Shortened stride length.                                                                                                                               TREATMENT DATE: 06/09/2023  Subjective:  Pt. Arrives to treatment with NPS 0/10 and states that their feet still "feel like they have socks on." Pt. Explains that they had an  episode of dizziness when getting up from the couch which resulted in them falling over. Pt. Clarifies that they "only fell once over the weekend, and that they got dizzy when reaching down to do something and then getting up fast." Pt. Indicates that they have been doing their exercises each morning after getting up. Pt. Continues noting that they are feeling different muscles work the more they do the exercises. Pt. Elaborates that they had a good weekend taking care of her grandchildren. Pt. Notes that they get a "bloodshot eye once per month."   Therapeutic Exercise:  Nu-Step: 10 Minutes L5. (Pt. Notes some arm soreness)  Seated YTB scapular W's anchored around legs 3x15  Seated YTB mid-row anchored to //-bars. 3x15  YTB Monster walks 6 laps of 30 ft. Cuing for increase hip/knee flexion and slowing down movement.   YTB Lateral stepping 6 laps of 30 ft. Cuing for maintaining neutral pelvis.   There.act.:  //-Bar AirEx Pad tandem balance static standing 5x30 seconds. (Cue to limit UE assistance and bend knees)  //-Bar AirEx Balance Beam dynamic walking, 8 laps (4 instances of LOB)  BlazePods: Focus setting, floor star patten with GTB around ankles, 3x1 minute. Recovery step practice/strengthening.    Nautilus Resisted walking 20# forward/backwards/lateral 10x each direction. Cue to control resistance with LE rather than lumbar spine)   PATIENT EDUCATION:  Education details: Intrinsic foot muscle activation, proximal hip musculature activation, pelvic tilt correction education Person educated: Patient Education method: Explanation Education comprehension:  verbalized understanding  HOME EXERCISE PROGRAM:  Access Code: [email or web address] URL: [email or web address] Date: 06/09/2023 Prepared by: Maylon Peppers  Exercises - Shoulder External Rotation and Scapular Retraction with Resistance  - 2-3 x daily - 5-7 x weekly - 3 sets - 10 reps - Standing Shoulder External Rotation with Resistance  - 2-3 x daily - 5-7 x weekly - 3 sets - 10 reps - Standing Row with Anchored Resistance  - 2-3 x daily - 5-7 x weekly - 3 sets - 10 reps - Shoulder W - External Rotation with Resistance  - 2-3 x daily - 5-7 x weekly - 3 sets - 10 reps - Standing 3-Way Leg Reach with Resistance at Ankles and Counter Support  - 2-3 x daily - 5-7 x weekly - 3 sets - 10 reps - Standing Hip Abduction Kicks  - 2-3 x daily - 5-7 x weekly - 3 sets - 10 reps   ASSESSMENT:  CLINICAL IMPRESSION:  Pt. Arrives to PT treatment slightly after their scheduled time and apologizes for being late. Pt. Is in good spirits and explains that the have been compliant with their current HEP. They note feeling different muscles work when doing the exercises. Pt. Exhibits improved static balance exercise quality of movement. Pt. Is doing well limiting B UE for balance while in //-bars. Pt. Explained one episode of falling over the weekend and PT educated on taking a minute to stand in place after moving from sitting to manage episodes of dizziness. Pt. Responded well to new blaze pods intervention for LE proprioception. Pt. Notes that they didn't want to proceed with their MRI because of financial limitations. Pt. Demonstrates weakness in hip abduction/extension after participating in several balance activities. Today's treatment session focused on establishing a global strength HEP and beginning balance exercises related to LE proprioception. Pt. Will benefit from skilled PT services to increase global proprioception and motor coordination to decrease their fear of falling.     OBJECTIVE IMPAIRMENTS:  decreased balance.  ACTIVITY LIMITATIONS: Walking in rough terrain  PARTICIPATION LIMITATIONS: community activity  PERSONAL FACTORS: Fitness are also affecting patient's functional outcome.   REHAB POTENTIAL: Good  CLINICAL DECISION MAKING: Stable/uncomplicated  EVALUATION COMPLEXITY: Moderate   GOALS: Goals reviewed with patient? Yes  SHORT TERM GOALS: Target date: 06/29/2023  Patient will be able to stand on AirEx pad for 20 seconds without LOB to demonstrate increased LE muscle coordination/proprioception.  Baseline: 8 Seconds Goal status: INITIAL  2.  Patient will increase standing functional reach measure from 4" to 6" as tested on the BERG balance scale to demonstrate improved UE muscle coordination.  Baseline: 4" Goal status: INITIAL    LONG TERM GOALS: Target date: 07/19/2023  Pt. Will be able to stand in eyes-closed tandem for greater than 15 seconds to demonstrate increased global proprioception.  Baseline: 7 Seconds Goal status: INITIAL  2.  Pt. Will be able to walk one mile on a treadmill with PT provided random perturbation to demonstrate increased confidence when walking through difficult terrains.   Baseline: TBD Goal status: INITIAL  3.  Pt. Will increase proximal hip MMT scores from 3/5 to 4/5 (ABD, ER, IR, EXT) to demonstrate increased global LE strength and motor coordination.  Baseline: See above Goal status: INITIAL  4.  Pt. Will increase periscapular muscular strength to 4/5 to increase confidence using B UE for fall recovery strategies.  Baseline: NT Goal status: INITIAL   PLAN:  PT FREQUENCY: 2x/week  PT DURATION: 6 weeks  PLANNED INTERVENTIONS: 97110-Therapeutic exercises, 97530- Therapeutic activity, [id number]- Neuromuscular re-education, 97535- Self Care, 16109- Manual therapy, and 97116- Gait training  PLAN FOR NEXT SESSION: Incorporate blaze pods balance and proprioception activities to assess global reaction time and motor  coordination deficits.  Cammie Mcgee, PT, DPT # [phone] Physical Therapist - [facility]  06/14/2023, 9:50 AM   Milford Cage, SPT

## 2023-06-15 ENCOUNTER — Encounter: Payer: Self-pay | Admitting: Dermatology

## 2023-06-15 ENCOUNTER — Ambulatory Visit: Payer: Medicare Other | Admitting: Dermatology

## 2023-06-15 DIAGNOSIS — D229 Melanocytic nevi, unspecified: Secondary | ICD-10-CM

## 2023-06-15 DIAGNOSIS — L570 Actinic keratosis: Secondary | ICD-10-CM

## 2023-06-15 DIAGNOSIS — D1801 Hemangioma of skin and subcutaneous tissue: Secondary | ICD-10-CM

## 2023-06-15 DIAGNOSIS — L820 Inflamed seborrheic keratosis: Secondary | ICD-10-CM

## 2023-06-15 DIAGNOSIS — Z1283 Encounter for screening for malignant neoplasm of skin: Secondary | ICD-10-CM

## 2023-06-15 DIAGNOSIS — R238 Other skin changes: Secondary | ICD-10-CM

## 2023-06-15 DIAGNOSIS — Z85828 Personal history of other malignant neoplasm of skin: Secondary | ICD-10-CM

## 2023-06-15 DIAGNOSIS — L814 Other melanin hyperpigmentation: Secondary | ICD-10-CM

## 2023-06-15 DIAGNOSIS — L578 Other skin changes due to chronic exposure to nonionizing radiation: Secondary | ICD-10-CM

## 2023-06-15 DIAGNOSIS — L821 Other seborrheic keratosis: Secondary | ICD-10-CM

## 2023-06-15 DIAGNOSIS — W908XXA Exposure to other nonionizing radiation, initial encounter: Secondary | ICD-10-CM

## 2023-06-15 DIAGNOSIS — Z86018 Personal history of other benign neoplasm: Secondary | ICD-10-CM

## 2023-06-15 NOTE — Patient Instructions (Signed)

## 2023-06-15 NOTE — Progress Notes (Signed)
 Follow-Up Visit   Subjective  Hailey Cain is a 78 y.o. female who presents for the following: Skin Cancer Screening and Full Body Skin Exam, hx of BCCs, Dysplastic Nevus, Aks, check spots R face, R leg, scratches at one on R face  The patient presents for Total-Body Skin Exam (TBSE) for skin cancer screening and mole check. The patient has spots, moles and lesions to be evaluated, some may be new or changing and the patient may have concern these could be cancer.    The following portions of the chart were reviewed this encounter and updated as appropriate: medications, allergies, medical history  Review of Systems:  No other skin or systemic complaints except as noted in HPI or Assessment and Plan.  Objective  Well appearing patient in no apparent distress; mood and affect are within normal limits.  A full examination was performed including scalp, head, eyes, ears, nose, lips, neck, chest, axillae, abdomen, back, buttocks, bilateral upper extremities, bilateral lower extremities, hands, feet, fingers, toes, fingernails, and toenails. All findings within normal limits unless otherwise noted below.   Relevant physical exam findings are noted in the Assessment and Plan.  L lat distal thigh  arms Pink scaly macules on forearms and dorsal hands  Assessment & Plan   SKIN CANCER SCREENING PERFORMED TODAY.  ACTINIC DAMAGE - Chronic condition, secondary to cumulative UV/sun exposure - diffuse scaly erythematous macules with underlying dyspigmentation - Recommend daily broad spectrum sunscreen SPF 30+ to sun-exposed areas, reapply every 2 hours as needed.  - Staying in the shade or wearing long sleeves, sun glasses (UVA+UVB protection) and wide brim hats (4-inch brim around the entire circumference of the hat) are also recommended for sun protection.  - Call for new or changing lesions.  LENTIGINES, SEBORRHEIC KERATOSES, HEMANGIOMAS - Benign normal skin lesions -  Benign-appearing - Call for any changes - Sks R face, R thigh  MELANOCYTIC NEVI - Tan-brown and/or pink-flesh-colored symmetric macules and papules - Benign appearing on exam today - Observation - Call clinic for new or changing moles - Recommend daily use of broad spectrum spf 30+ sunscreen to sun-exposed areas.   HISTORY OF BASAL CELL CARCINOMA OF THE SKIN - No evidence of recurrence today - Recommend regular full body skin exams - Recommend daily broad spectrum sunscreen SPF 30+ to sun-exposed areas, reapply every 2 hours as needed.  - Call if any new or changing lesions are noted between office visits  - L nose, R medial arm  HISTORY OF DYSPLASTIC NEVUS No evidence of recurrence today Recommend regular full body skin exams Recommend daily broad spectrum sunscreen SPF 30+ to sun-exposed areas, reapply every 2 hours as needed.  Call if any new or changing lesions are noted between office visits  - L lower chest  Likely CONGENITAL NEVUS L lat distal thigh Exam: 1.0 cm pink pap with irregular telangiectasias, L lat distal thigh, present whole life per patient report  Treatment Plan: Observe for changes   AK (ACTINIC KERATOSIS) arms Discussed LN2, pt prefers to observe  Actinic keratoses are precancerous spots that appear secondary to cumulative UV radiation exposure/sun exposure over time. They are chronic with expected duration over 1 year. A portion of actinic keratoses will progress to squamous cell carcinoma of the skin. It is not possible to reliably predict which spots will progress to skin cancer and so treatment is recommended to prevent development of skin cancer.  Recommend daily broad spectrum sunscreen SPF 30+ to sun-exposed areas, reapply every  2 hours as needed.  Recommend staying in the shade or wearing long sleeves, sun glasses (UVA+UVB protection) and wide brim hats (4-inch brim around the entire circumference of the hat). Call for new or changing  lesions. MULTIPLE BENIGN NEVI   LENTIGINES   ACTINIC ELASTOSIS   SEBORRHEIC KERATOSES   CHERRY ANGIOMA   INFLAMED SEBORRHEIC KERATOSIS   Return in about 1 year (around 06/14/2024) for TBSE, Hx of BCC, Hx of Dysplastic nevi, Hx of AKs.  I, Grayce Saunas, RMA, am acting as scribe for Boneta Sharps, MD .   Documentation: I have reviewed the above documentation for accuracy and completeness, and I agree with the above.  Boneta Sharps, MD

## 2023-06-16 ENCOUNTER — Ambulatory Visit: Payer: Medicare Other | Admitting: Physical Therapy

## 2023-06-16 DIAGNOSIS — R29898 Other symptoms and signs involving the musculoskeletal system: Secondary | ICD-10-CM

## 2023-06-16 DIAGNOSIS — M6281 Muscle weakness (generalized): Secondary | ICD-10-CM | POA: Diagnosis not present

## 2023-06-16 DIAGNOSIS — R2689 Other abnormalities of gait and mobility: Secondary | ICD-10-CM | POA: Diagnosis not present

## 2023-06-16 DIAGNOSIS — R2681 Unsteadiness on feet: Secondary | ICD-10-CM

## 2023-06-16 NOTE — Therapy (Signed)
 OUTPATIENT PHYSICAL THERAPY LOWER EXTREMITY TREATMENT   Patient Name: Hailey Cain MRN: [id number] DOB:Jun 05, 1945, 78 y.o., female Today's Date: 06/16/2023  END OF SESSION:  PT End of Session - 06/16/23 0901     Visit Number 4    Number of Visits 12    Date for PT Re-Evaluation 07/19/23    PT Start Time 0902    PT Stop Time 0953    PT Time Calculation (min) 51 min    Equipment Utilized During Treatment Gait belt             Past Medical History:  Diagnosis Date   Acid reflux 12/30/2006   Actinic keratosis    Adult hypothyroidism 01/13/2007   Allergic rhinitis 09/07/2014   Anxiety 12/10/2014   Basal cell carcinoma 2013   left nose, > 10 years ago   BCC (basal cell carcinoma), arm, right 12/14/2022   Right medial arm. Nodular pattern. Excised 01/20/2023   Bronchitis 05/17/2015   Cancer of cheek (HCC) 07/04/2007   Depression, neurotic 12/30/2006   Dysplastic nevus 12/16/2021   left lower chest moderate atypia   Hyperlipidemia    Prediabetes 09/07/2014   RAD (reactive airway disease) 09/07/2014   Small bowel obstruction (HCC) 01/31/2017   Past Surgical History:  Procedure Laterality Date   ABDOMINAL HYSTERECTOMY  1988   heavy, irregular menses; ovaries intact   AUGMENTATION MAMMAPLASTY Bilateral    silicone   BREAST SURGERY  1970   augmentation   CATARACT EXTRACTION Bilateral 2013   Dr. Lavonia.  [place]o.  OD-Distance.   EYE SURGERY     cataract extraction   EYE SURGERY  2007   LASIK   MEMBRANE PEEL Left 05/25/2019   Procedure: MEMBRANE PEEL;  Surgeon: Valdemar Rogue, MD;  Location: [facility] OR;  Service: Ophthalmology;  Laterality: Left;   PARS PLANA VITRECTOMY Left 05/25/2019   Procedure: PARS PLANA VITRECTOMY WITH 25 GAUGE;  Surgeon: Valdemar Rogue, MD;  Location: [facility] OR;  Service: Ophthalmology;  Laterality: Left;   TONSILLECTOMY AND ADENOIDECTOMY  1950   TUBAL LIGATION     YAG LASER APPLICATION Left 03/24/2019   Dr. Rogue Valdemar   Patient Active Problem List    Diagnosis Date Noted   Impairment of balance 05/10/2023   Dizziness on standing 05/10/2023   Polyneuropathy associated with underlying disease (HCC) 05/10/2023   Memory changes 03/12/2022   Recurrent depression (HCC) 11/12/2021   Skin lesion 11/12/2021   Stiff back 06/19/2021   Low back pain 06/06/2021   Numbness of lower limb 06/06/2021   History of small bowel obstruction 01/31/2017   Anxiety 12/10/2014   Osteoporosis 11/06/2014   Allergic rhinitis 09/07/2014   Lacunar infarction (HCC) 09/07/2014   RAD (reactive airway disease) 09/07/2014   Prediabetes 09/07/2014   Cancer of cheek (HCC) 07/04/2007   Adult hypothyroidism 01/13/2007   Narrowing of intervertebral disc space 12/30/2006   GERD (gastroesophageal reflux disease) 12/30/2006   Fam hx-ischem heart disease 12/30/2006   History of tobacco use 12/30/2006   Hypercholesterolemia without hypertriglyceridemia 12/30/2006    PCP:   Donzella Lauraine SAILOR, DO    REFERRING PROVIDER:   Donzella Lauraine SAILOR, DO    REFERRING DIAG: Balance concerns and global loss of strength  THERAPY DIAG:  General unsteadiness  Impaired strength of lower extremity  Other abnormalities of gait and mobility  Muscle weakness (generalized)  Rationale for Evaluation and Treatment: Rehabilitation  ONSET DATE: 6 Months ago, on and off episodes of falling  SUBJECTIVE:   SUBJECTIVE STATEMENT:  Patient arrives to PT clinic on time and prepared for evaluation. Pt. Explains that over the past 6 months they have fallen 1-2/month that have made them concerned for their safety when ambulating. Pt. Helps their daughter take care of her two grandchildren and states she would like to make sure she can keep doing so. Pt. Presents concerns of having balance issues in the past and wanting to work towards increasing their leg and upper body strength. Pt. Describes that their past falls have always been forward, and have been accompanied by a feeling of dizziness.  Pt. Expresses that they want to regain confidence with community ambulation and continue to be independent with all ADLs.   PERTINENT HISTORY: See PMH in records PAIN:  Are you having pain? No  PRECAUTIONS: None  RED FLAGS: None   WEIGHT BEARING RESTRICTIONS: No  FALLS:  Has patient fallen in last 6 months? Yes. Number of falls 3  LIVING ENVIRONMENT: Lives with: lives with their family and lives alone Lives in: Mobile home Stairs: No Has following equipment at home: None  OCCUPATION: Retired  PLOF: Independent  PATIENT GOALS:   Regain confidence walking independently with grandchildren.  NEXT MD VISIT: NA  OBJECTIVE:   PT performed vestibular screening using Dix Halpike maneuver, no significant findings noted.  Pt. Completed BERG and LEFS  Pt. Demonstrated 100 ft. Of unassisted walking and showed a decreased stride length.   Pt. Showed a decreased arm-swing with cautious gait.   Pt. Was strength tested for bilateral LE (see chart)    Note: Objective measures were completed at Evaluation unless otherwise noted.  DIAGNOSTIC FINDINGS:   Brain MRI ordered  PATIENT SURVEYS:  LEFS 63 BERG 54/56  COGNITION: Overall cognitive status: Within functional limits for tasks assessed     SENSATION: WFL  EDEMA:   No presence  MUSCLE LENGTH:  NT  POSTURE: rounded shoulders  PALPATION:   Bilateral medial and lateral ankle, WNL  LOWER EXTREMITY MMT:  MMT Right eval Left eval  Hip flexion 4 4  Hip extension 4 4  Hip abduction 3 3  Hip adduction 4 4  Hip internal rotation 4 4  Hip external rotation 4 4  Knee flexion 4 4  Knee extension 4 4  Ankle dorsiflexion 3 3  Ankle plantarflexion 4- 4  Ankle inversion 4- 4  Ankle eversion 4- 4   (Blank rows = not tested)  LOWER EXTREMITY SPECIAL TESTS:  NT  FUNCTIONAL TESTS:  Berg Balance Scale: 54/56  GAIT: Distance walked: 150 ft. In clinic hallway Assistive device utilized: None Level of  assistance: Complete Independence Comments: Shortened stride length.                                                                                                                               TREATMENT DATE: 06/09/2023  Subjective:  Pt. Arrives to treatment with NPS 0/10 and states that their feet still feel like they have  socks on. Pt. Expresses that they were sore after last visit, specifically their arms. Pt. Notes that they are pleased with their PT experience so far and that they want to make sure they are working on their grip strength as well. Pt. Describes their episodes of dizziness have been consistent in the morning. Pt. Inquires about their gabapentin  prescription, noting an improvement with its use with numbness an tingling symptoms related B LE.    Therapeutic Exercise:  Nu-Step B UE/LE: 10 Minutes at L5.  Discussed upcoming weekend/ activity level.    Standing B 3# dumbbell bicep curls 2x10 B. RPE 4/10  BOSU Ball standing //-bar balance with CGA. 2x3 minutes. RPE 8  Seated YTB mid-row anchored to //-bars. 3x15  YTB Monster walks 6 laps of 30 ft. Cuing for increase hip/knee flexion and slowing down movement.   YTB Lateral stepping 6 laps of 30 ft. Cuing for maintaining neutral pelvis.   There.act.:  //-Bar AirEx Pad tandem balance static standing 5x30 seconds with body blade. (Cue to limit UE assistance and bend knees)  //-Bar AirEx Balance Beam/pad/bosu ball obstacle course walking, 10 laps (4 instances of LOB)  Grip Strength: Handgrip Dynamometer Left 26 Lbs  Right 33 Lbs  B Elbow Flexion MMT: 4   NOT TODAY:  BlazePods: Focus setting, floor star patten with GTB around ankles, 3x1 minute. Recovery step practice/strengthening.    Nautilus Resisted walking 20# forward/backwards/lateral 10x each direction. Cue to control resistance with LE rather than lumbar spine)    PATIENT EDUCATION:  Education details: Intrinsic foot muscle activation, proximal hip  musculature activation, pelvic tilt correction education Person educated: Patient Education method: Explanation Education comprehension: verbalized understanding  HOME EXERCISE PROGRAM:  Access Code: [email or web address] URL: [email or web address] Date: 06/09/2023 Prepared by: Maryanne Finder  Exercises - Shoulder External Rotation and Scapular Retraction with Resistance  - 2-3 x daily - 5-7 x weekly - 3 sets - 10 reps - Standing Shoulder External Rotation with Resistance  - 2-3 x daily - 5-7 x weekly - 3 sets - 10 reps - Standing Row with Anchored Resistance  - 2-3 x daily - 5-7 x weekly - 3 sets - 10 reps - Shoulder W - External Rotation with Resistance  - 2-3 x daily - 5-7 x weekly - 3 sets - 10 reps - Standing 3-Way Leg Reach with Resistance at Ankles and Counter Support  - 2-3 x daily - 5-7 x weekly - 3 sets - 10 reps - Standing Hip Abduction Kicks  - 2-3 x daily - 5-7 x weekly - 3 sets - 10 reps   ASSESSMENT:  CLINICAL IMPRESSION:  Pt. Was highly compliant with all therapeutic interventions during today's treatment session. Pt. Exhibited weakness with B handgrip and PT began implementing more B UE strength exercises. Pt. Stated that their exercises have all been challenging within reason. Pt. Demonstrates weakness in hip abduction/extension after participating in several dynamic balance activities. Today's treatment session focused on progressing global strength and incorporating more LE proprioception activities. Pt. Will benefit from skilled PT services to increase global proprioception and motor coordination to decrease their fear of falling.     OBJECTIVE IMPAIRMENTS: decreased balance.   ACTIVITY LIMITATIONS: Walking in rough terrain  PARTICIPATION LIMITATIONS: community activity  PERSONAL FACTORS: Fitness are also affecting patient's functional outcome.   REHAB POTENTIAL: Good  CLINICAL DECISION MAKING: Stable/uncomplicated  EVALUATION COMPLEXITY:  Moderate   GOALS: Goals reviewed with patient? Yes  SHORT TERM GOALS: Target date: 06/29/2023  Patient will be  able to stand on AirEx pad for 20 seconds without LOB to demonstrate increased LE muscle coordination/proprioception.  Baseline: 8 Seconds Goal status: INITIAL  2.  Patient will increase standing functional reach measure from 4 to 6 as tested on the BERG balance scale to demonstrate improved UE muscle coordination.  Baseline: 4 Goal status: INITIAL    LONG TERM GOALS: Target date: 07/19/2023  Pt. Will be able to stand in eyes-closed tandem for greater than 15 seconds to demonstrate increased global proprioception.  Baseline: 7 Seconds Goal status: INITIAL  2.  Pt. Will be able to walk one mile on a treadmill with PT provided random perturbation to demonstrate increased confidence when walking through difficult terrains.   Baseline: TBD Goal status: INITIAL  3.  Pt. Will increase proximal hip MMT scores from 3/5 to 4/5 (ABD, ER, IR, EXT) to demonstrate increased global LE strength and motor coordination.  Baseline: See above Goal status: INITIAL  4.  Pt. Will increase periscapular muscular strength to 4/5 to increase confidence using B UE for fall recovery strategies.  Baseline: NT Goal status: INITIAL   PLAN:  PT FREQUENCY: 2x/week  PT DURATION: 6 weeks  PLANNED INTERVENTIONS: 97110-Therapeutic exercises, 97530- Therapeutic activity, [id number]- Neuromuscular re-education, 97535- Self Care, [id number]- Manual therapy, and [id number]- Gait training  PLAN FOR NEXT SESSION: Advance global strength training  Ozell JAYSON Sero, PT, DPT # [phone] Physical Therapist - [facility]  06/16/2023, 9:53 AM   Beverley Bunker, SPT

## 2023-06-18 ENCOUNTER — Other Ambulatory Visit: Payer: Self-pay | Admitting: Family Medicine

## 2023-06-18 DIAGNOSIS — M810 Age-related osteoporosis without current pathological fracture: Secondary | ICD-10-CM

## 2023-06-21 ENCOUNTER — Encounter: Payer: Self-pay | Admitting: Physical Therapy

## 2023-06-21 ENCOUNTER — Ambulatory Visit: Payer: Medicare Other | Admitting: Physical Therapy

## 2023-06-21 DIAGNOSIS — R2681 Unsteadiness on feet: Secondary | ICD-10-CM | POA: Diagnosis not present

## 2023-06-21 DIAGNOSIS — M6281 Muscle weakness (generalized): Secondary | ICD-10-CM | POA: Diagnosis not present

## 2023-06-21 DIAGNOSIS — R29898 Other symptoms and signs involving the musculoskeletal system: Secondary | ICD-10-CM

## 2023-06-21 DIAGNOSIS — R2689 Other abnormalities of gait and mobility: Secondary | ICD-10-CM | POA: Diagnosis not present

## 2023-06-21 NOTE — Therapy (Signed)
 OUTPATIENT PHYSICAL THERAPY LOWER EXTREMITY TREATMENT  Patient Name: Hailey Cain MRN: [id number] DOB:01/27/1946, 78 y.o., female Today's Date: 06/21/2023  END OF SESSION:  PT End of Session - 06/21/23 0857     Visit Number 5    Number of Visits 12    Date for PT Re-Evaluation 07/19/23    PT Start Time 0857    PT Stop Time 0952    PT Time Calculation (min) 55 min    Equipment Utilized During Treatment Gait belt             Past Medical History:  Diagnosis Date   Acid reflux 12/30/2006   Actinic keratosis    Adult hypothyroidism 01/13/2007   Allergic rhinitis 09/07/2014   Anxiety 12/10/2014   Basal cell carcinoma 2013   left nose, > 10 years ago   BCC (basal cell carcinoma), arm, right 12/14/2022   Right medial arm. Nodular pattern. Excised 01/20/2023   Bronchitis 05/17/2015   Cancer of cheek (HCC) 07/04/2007   Depression, neurotic 12/30/2006   Dysplastic nevus 12/16/2021   left lower chest moderate atypia   Hyperlipidemia    Prediabetes 09/07/2014   RAD (reactive airway disease) 09/07/2014   Small bowel obstruction (HCC) 01/31/2017   Past Surgical History:  Procedure Laterality Date   ABDOMINAL HYSTERECTOMY  1988   heavy, irregular menses; ovaries intact   AUGMENTATION MAMMAPLASTY Bilateral    silicone   BREAST SURGERY  1970   augmentation   CATARACT EXTRACTION Bilateral 2013   Dr. Lenna Quinton.  [place].  [facility]-Distance.   EYE SURGERY     cataract extraction   EYE SURGERY  2007   LASIK   MEMBRANE PEEL Left 05/25/2019   Procedure: MEMBRANE PEEL;  Surgeon: Ronelle Coffee, MD;  Location: [facility] OR;  Service: Ophthalmology;  Laterality: Left;   PARS PLANA VITRECTOMY Left 05/25/2019   Procedure: PARS PLANA VITRECTOMY WITH 25 GAUGE;  Surgeon: Ronelle Coffee, MD;  Location: [facility] OR;  Service: Ophthalmology;  Laterality: Left;   TONSILLECTOMY AND ADENOIDECTOMY  1950   TUBAL LIGATION     YAG LASER APPLICATION Left 03/24/2019   Dr. Ronelle Coffee   Patient Active Problem List    Diagnosis Date Noted   Impairment of balance 05/10/2023   Dizziness on standing 05/10/2023   Polyneuropathy associated with underlying disease (HCC) 05/10/2023   Memory changes 03/12/2022   Recurrent depression (HCC) 11/12/2021   Skin lesion 11/12/2021   Stiff back 06/19/2021   Low back pain 06/06/2021   Numbness of lower limb 06/06/2021   History of small bowel obstruction 01/31/2017   Anxiety 12/10/2014   Osteoporosis 11/06/2014   Allergic rhinitis 09/07/2014   Lacunar infarction (HCC) 09/07/2014   RAD (reactive airway disease) 09/07/2014   Prediabetes 09/07/2014   Cancer of cheek (HCC) 07/04/2007   Adult hypothyroidism 01/13/2007   Narrowing of intervertebral disc space 12/30/2006   GERD (gastroesophageal reflux disease) 12/30/2006   Fam hx-ischem heart disease 12/30/2006   History of tobacco use 12/30/2006   Hypercholesterolemia without hypertriglyceridemia 12/30/2006    PCP:   Carlean Charter, DO    REFERRING PROVIDER:   Carlean Charter, DO    REFERRING DIAG: Balance concerns and global loss of strength  THERAPY DIAG:  General unsteadiness  Impaired strength of lower extremity  Other abnormalities of gait and mobility  Muscle weakness (generalized)  Rationale for Evaluation and Treatment: Rehabilitation  ONSET DATE: 6 Months ago, on and off episodes of falling  SUBJECTIVE:   SUBJECTIVE STATEMENT:  Patient arrives to PT clinic on time and prepared for evaluation. Pt. Explains that over the past 6 months they have fallen 1-2/month that have made them concerned for their safety when ambulating. Pt. Helps their daughter take care of her two grandchildren and states she would like to make sure she can keep doing so. Pt. Presents concerns of having balance issues in the past and wanting to work towards increasing their leg and upper body strength. Pt. Describes that their past falls have always been forward, and have been accompanied by a feeling of "dizziness."  Pt. Expresses that they want to regain confidence with community ambulation and continue to be independent with all ADLs.   PERTINENT HISTORY: See PMH in records PAIN:  Are you having pain? No  PRECAUTIONS: None  RED FLAGS: None   WEIGHT BEARING RESTRICTIONS: No  FALLS:  Has patient fallen in last 6 months? Yes. Number of falls 3  LIVING ENVIRONMENT: Lives with: lives with their family and lives alone Lives in: Mobile home Stairs: No Has following equipment at home: None  OCCUPATION: Retired  PLOF: Independent  PATIENT GOALS:   Regain confidence walking independently with grandchildren.  NEXT MD VISIT: NA  OBJECTIVE:   PT performed vestibular screening using Dix Halpike maneuver, no significant findings noted.  Pt. Completed BERG and LEFS  Pt. Demonstrated 100 ft. Of unassisted walking and showed a decreased stride length.   Pt. Showed a decreased arm-swing with cautious gait.   Pt. Was strength tested for bilateral LE (see chart)    Note: Objective measures were completed at Evaluation unless otherwise noted.  DIAGNOSTIC FINDINGS:   Brain MRI ordered  PATIENT SURVEYS:  LEFS 63 BERG 54/56  COGNITION: Overall cognitive status: Within functional limits for tasks assessed     SENSATION: WFL  EDEMA:   No presence  MUSCLE LENGTH:  NT  POSTURE: rounded shoulders  PALPATION:   Bilateral medial and lateral ankle, WNL  LOWER EXTREMITY MMT:  MMT Right eval Left eval  Hip flexion 4 4  Hip extension 4 4  Hip abduction 3 3  Hip adduction 4 4  Hip internal rotation 4 4  Hip external rotation 4 4  Knee flexion 4 4  Knee extension 4 4  Ankle dorsiflexion 3 3  Ankle plantarflexion 4- 4  Ankle inversion 4- 4  Ankle eversion 4- 4   (Blank rows = not tested)  LOWER EXTREMITY SPECIAL TESTS:  NT  FUNCTIONAL TESTS:  Berg Balance Scale: 54/56  Grip Strength: Handgrip Dynamometer Left 26 Lbs  Right 33 Lbs  GAIT: Distance walked: 150 ft.  In clinic hallway Assistive device utilized: None Level of assistance: Complete Independence Comments: Shortened stride length.                                                                         TREATMENT DATE: 06/21/2023  Subjective:  Pt. Arrives to treatment with NPS 0/10 and states that they had no episodes of falling or dizziness over the weekend. Pt. Describes that they were able to spend several hours with their grandchildren. Pt. States they went to town to have their dog groomed and felt comfortable doing so. Pt. Remarks they are doing their HEP once per  day and notes RPE 4/10 when doing exercises.  Therapeutic Exercise:  Standing B 4# dumbbell bicep curls 2x10 B. RPE 4/10  BOSU Ball standing //-bar balance with CGA. 2x3 minutes. RPE 8    Seated YTB mid-row anchored to //-bars. 3x15  NOT TODAY:  YTB Monster walks 6 laps of 30 ft. Cuing for increase hip/knee flexion and slowing down movement.   YTB Lateral stepping 6 laps of 30 ft. Cuing for maintaining neutral pelvis.   There.act.:  //-Bar AirEx Pad tandem balance static standing 5x30 seconds with body blade. (Cue to limit UE assistance and bend knees)  //-Bar AirEx Balance Beam/pad/bosu ball obstacle course walking, 10 laps holding 2# dumbbell (4 instances of LOB)  BlazePods: Focus setting, floor star patten toe taps in all directions. 3x1.5 minute each leg. Recovery step practice/strengthening focus.   Nautilus Resisted walking 30# forward/backwards/lateral 10x each direction. Cue to control resistance with LE rather than lumbar spine)  Nautilus D-handle Row:  Mid-row 30# 3x10. Cuing for scapular retraction   PATIENT EDUCATION:  Education details: Intrinsic foot muscle activation, proximal hip musculature activation, pelvic tilt correction education Person educated: Patient Education method: Explanation Education comprehension: verbalized understanding  HOME EXERCISE PROGRAM:  Access Code: [email or web address] URL:  [email or web address] Date: 06/09/2023 Prepared by: Janine Melbourne  Exercises - Shoulder External Rotation and Scapular Retraction with Resistance  - 2-3 x daily - 5-7 x weekly - 3 sets - 10 reps - Standing Shoulder External Rotation with Resistance  - 2-3 x daily - 5-7 x weekly - 3 sets - 10 reps - Standing Row with Anchored Resistance  - 2-3 x daily - 5-7 x weekly - 3 sets - 10 reps - Shoulder W - External Rotation with Resistance  - 2-3 x daily - 5-7 x weekly - 3 sets - 10 reps - Standing 3-Way Leg Reach with Resistance at Ankles and Counter Support  - 2-3 x daily - 5-7 x weekly - 3 sets - 10 reps - Standing Hip Abduction Kicks  - 2-3 x daily - 5-7 x weekly - 3 sets - 10 reps   ASSESSMENT:  CLINICAL IMPRESSION:  Pt. Arrived to PT treatment session early and prepared. Today's treatment session focused on progressing global strength and balance activities to challenge B LE. Pt. Progressed to performing turns on unstable surfaces in //-bar balance interventions. PT Incorporated more blaze-pods activities to increase B LE coordination and challenge B dorsiflexion AROM. Pt. Is challenged with recovery step training and B dorsiflexion during gait. Pt. Will benefit from skilled PT services to increase global proprioception and motor coordination to decrease their fear of falling.     OBJECTIVE IMPAIRMENTS: decreased balance.   ACTIVITY LIMITATIONS: Walking in rough terrain  PARTICIPATION LIMITATIONS: community activity  PERSONAL FACTORS: Fitness are also affecting patient's functional outcome.   REHAB POTENTIAL: Good  CLINICAL DECISION MAKING: Stable/uncomplicated  EVALUATION COMPLEXITY: Moderate   GOALS: Goals reviewed with patient? Yes  SHORT TERM GOALS: Target date: 06/29/2023  Patient will be able to stand on AirEx pad for 20 seconds without LOB to demonstrate increased LE muscle coordination/proprioception.  Baseline: 8 Seconds Goal status: INITIAL  2.   Patient will increase standing functional reach measure from 4" to 6" as tested on the BERG balance scale to demonstrate improved UE muscle coordination.  Baseline: 4" Goal status: INITIAL    LONG TERM GOALS: Target date: 07/19/2023  Pt. Will be able to stand in eyes-closed tandem for greater than 15 seconds to demonstrate increased global proprioception.  Baseline: 7 Seconds Goal status: INITIAL  2.  Pt. Will be able to walk one mile on a treadmill with PT provided random perturbation to demonstrate increased confidence when walking through difficult terrains.   Baseline: TBD Goal status: INITIAL  3.  Pt. Will increase proximal hip MMT scores from 3/5 to 4/5 (ABD, ER, IR, EXT) to demonstrate increased global LE strength and motor coordination.  Baseline: See above Goal status: INITIAL  4.  Pt. Will increase periscapular muscular strength to 4/5 to increase confidence using B UE for fall recovery strategies.  Baseline: NT Goal status: INITIAL   PLAN:  PT FREQUENCY: 2x/week  PT DURATION: 6 weeks  PLANNED INTERVENTIONS: 97110-Therapeutic exercises, 97530- Therapeutic activity, [id number]- Neuromuscular re-education, 97535- Self Care, 16109- Manual therapy, and 97116- Gait training  PLAN FOR NEXT SESSION: Test 5x sit to stand.  CHECK GOALS  Lendell Quarry, PT, DPT # [phone] Physical Therapist - [facility]  [place], [place]  06/21/2023, 10:25 AM

## 2023-06-23 ENCOUNTER — Ambulatory Visit: Payer: Medicare Other | Admitting: Physical Therapy

## 2023-06-30 ENCOUNTER — Ambulatory Visit: Payer: Medicare Other | Admitting: Physical Therapy

## 2023-07-05 ENCOUNTER — Encounter: Payer: Self-pay | Admitting: Physical Therapy

## 2023-07-05 ENCOUNTER — Ambulatory Visit: Payer: Medicare Other | Admitting: Physical Therapy

## 2023-07-05 DIAGNOSIS — R2689 Other abnormalities of gait and mobility: Secondary | ICD-10-CM

## 2023-07-05 DIAGNOSIS — R29898 Other symptoms and signs involving the musculoskeletal system: Secondary | ICD-10-CM | POA: Diagnosis not present

## 2023-07-05 DIAGNOSIS — R2681 Unsteadiness on feet: Secondary | ICD-10-CM | POA: Diagnosis not present

## 2023-07-05 DIAGNOSIS — M6281 Muscle weakness (generalized): Secondary | ICD-10-CM

## 2023-07-05 NOTE — Therapy (Signed)
 OUTPATIENT PHYSICAL THERAPY LOWER EXTREMITY TREATMENT  Patient Name: Hailey Cain MRN: [id number] DOB:[birth year], 78 y.o., female Today's Date: 07/05/2023  END OF SESSION:  PT End of Session - 07/05/23 0903     Visit Number 6    Number of Visits 12    Date for PT Re-Evaluation 07/19/23    PT Start Time 0902    PT Stop Time 0959    PT Time Calculation (min) 57 min    Equipment Utilized During Treatment Gait belt             Past Medical History:  Diagnosis Date   Acid reflux 12/30/2006   Actinic keratosis    Adult hypothyroidism 01/13/2007   Allergic rhinitis 09/07/2014   Anxiety 12/10/2014   Basal cell carcinoma 2013   left nose, > 10 years ago   BCC (basal cell carcinoma), arm, right 12/14/2022   Right medial arm. Nodular pattern. Excised 01/20/2023   Bronchitis 05/17/2015   Cancer of cheek (HCC) 07/04/2007   Depression, neurotic 12/30/2006   Dysplastic nevus 12/16/2021   left lower chest moderate atypia   Hyperlipidemia    Prediabetes 09/07/2014   RAD (reactive airway disease) 09/07/2014   Small bowel obstruction (HCC) 01/31/2017   Past Surgical History:  Procedure Laterality Date   ABDOMINAL HYSTERECTOMY  1988   heavy, irregular menses; ovaries intact   AUGMENTATION MAMMAPLASTY Bilateral    silicone   BREAST SURGERY  1970   augmentation   CATARACT EXTRACTION Bilateral 2013   Dr. Vonna Kotyk.  [place].  OD-Distance.   EYE SURGERY     cataract extraction   EYE SURGERY  2007   LASIK   MEMBRANE PEEL Left 05/25/2019   Procedure: MEMBRANE PEEL;  Surgeon: Rennis Chris, MD;  Location: [facility] OR;  Service: Ophthalmology;  Laterality: Left;   PARS PLANA VITRECTOMY Left 05/25/2019   Procedure: PARS PLANA VITRECTOMY WITH 25 GAUGE;  Surgeon: Rennis Chris, MD;  Location: [facility] OR;  Service: Ophthalmology;  Laterality: Left;   TONSILLECTOMY AND ADENOIDECTOMY  1950   TUBAL LIGATION     YAG LASER APPLICATION Left 03/24/2019   Dr. Rennis Chris   Patient Active Problem List    Diagnosis Date Noted   Impairment of balance 05/10/2023   Dizziness on standing 05/10/2023   Polyneuropathy associated with underlying disease (HCC) 05/10/2023   Memory changes 03/12/2022   Recurrent depression (HCC) 11/12/2021   Skin lesion 11/12/2021   Stiff back 06/19/2021   Low back pain 06/06/2021   Numbness of lower limb 06/06/2021   History of small bowel obstruction 01/31/2017   Anxiety 12/10/2014   Osteoporosis 11/06/2014   Allergic rhinitis 09/07/2014   Lacunar infarction (HCC) 09/07/2014   RAD (reactive airway disease) 09/07/2014   Prediabetes 09/07/2014   Cancer of cheek (HCC) 07/04/2007   Adult hypothyroidism 01/13/2007   Narrowing of intervertebral disc space 12/30/2006   GERD (gastroesophageal reflux disease) 12/30/2006   Fam hx-ischem heart disease 12/30/2006   History of tobacco use 12/30/2006   Hypercholesterolemia without hypertriglyceridemia 12/30/2006    PCP:   Sherlyn Hay, DO    REFERRING PROVIDER:   Sherlyn Hay, DO    REFERRING DIAG: Balance concerns and global loss of strength  THERAPY DIAG:  General unsteadiness  Impaired strength of lower extremity  Other abnormalities of gait and mobility  Muscle weakness (generalized)  Rationale for Evaluation and Treatment: Rehabilitation  ONSET DATE: 6 Months ago, on and off episodes of falling  SUBJECTIVE:   SUBJECTIVE STATEMENT:  Patient arrives to PT clinic on time and prepared for evaluation. Pt. Explains that over the past 6 months they have fallen 1-2/month that have made them concerned for their safety when ambulating. Pt. Helps their daughter take care of her two grandchildren and states she would like to make sure she can keep doing so. Pt. Presents concerns of having balance issues in the past and wanting to work towards increasing their leg and upper body strength. Pt. Describes that their past falls have always been forward, and have been accompanied by a feeling of "dizziness."  Pt. Expresses that they want to regain confidence with community ambulation and continue to be independent with all ADLs.   PERTINENT HISTORY: See PMH in records PAIN:  Are you having pain? No  PRECAUTIONS: None  RED FLAGS: None   WEIGHT BEARING RESTRICTIONS: No  FALLS:  Has patient fallen in last 6 months? Yes. Number of falls 3  LIVING ENVIRONMENT: Lives with: lives with their family and lives alone Lives in: Mobile home Stairs: No Has following equipment at home: None  OCCUPATION: Retired  PLOF: Independent  PATIENT GOALS:   Regain confidence walking independently with grandchildren.  NEXT MD VISIT: NA  OBJECTIVE:   PT performed vestibular screening using Dix Halpike maneuver, no significant findings noted.  Pt. Completed BERG and LEFS  Pt. Demonstrated 100 ft. Of unassisted walking and showed a decreased stride length.   Pt. Showed a decreased arm-swing with cautious gait.   Pt. Was strength tested for bilateral LE (see chart)    Note: Objective measures were completed at Evaluation unless otherwise noted.  DIAGNOSTIC FINDINGS:   Brain MRI ordered  PATIENT SURVEYS:  LEFS 63 BERG 54/56  COGNITION: Overall cognitive status: Within functional limits for tasks assessed     SENSATION: WFL  EDEMA:   No presence  MUSCLE LENGTH:  NT  POSTURE: rounded shoulders  PALPATION:   Bilateral medial and lateral ankle, WNL  LOWER EXTREMITY MMT:  MMT Right eval Left eval  Hip flexion 4 4  Hip extension 4 4  Hip abduction 3 3  Hip adduction 4 4  Hip internal rotation 4 4  Hip external rotation 4 4  Knee flexion 4 4  Knee extension 4 4  Ankle dorsiflexion 3 3  Ankle plantarflexion 4- 4  Ankle inversion 4- 4  Ankle eversion 4- 4   (Blank rows = not tested)  LOWER EXTREMITY SPECIAL TESTS:  NT  FUNCTIONAL TESTS:  Berg Balance Scale: 54/56  Grip Strength: Handgrip Dynamometer Left 26 Lbs  Right 33 Lbs  GAIT: Distance walked: 150 ft.  In clinic hallway Assistive device utilized: None Level of assistance: Complete Independence Comments: Shortened stride length.                                                                         TREATMENT DATE: 07/05/2023  Subjective:  Pt. Arrives to treatment with NPS 0/10 and states that they had no episodes of falling or dizziness since last treatment. Pt. Describes that they have been staying busy with household activities during recent episodes of winter weather that have prevented them from attending PT. Pt. Describes they were able to shovel their porch and sidewalk and felt safe  doing so. Pt. Remarks they are doing their HEP once per day and notes RPE 3/10 when doing exercises.  Therapeutic Exercise:  Standing B 4# dumbbell bicep curls 2x20 B. RPE 4/10  BOSU Ball step up in //-bar with CGA. 3x2 minutes. RPE 8    YTB Monster walks 10 //-bar laps x 2. Cuing for increase hip/knee flexion and slowing down movement.   YTB Lateral stepping 6 laps of 30 ft. Cuing for maintaining neutral pelvis.   Nu-Step 10 Minutes at level 5.  There.act.:  //-Bar AirEx Pad tandem balance static standing 5x30 seconds with body blade. (Cue to limit UE assistance and bend knees)  //-Bar AirEx Balance Beam/pad/bosu ball obstacle course walking, 10 laps holding 2# dumbbell (5+ instances of LOB)   Nautilus Resisted walking 30# forward/backwards/lateral 10x each direction. Cue to control resistance with LE rather than lumbar spine)  Nautilus D-handle Row:  Mid-row 30# 3x10. Cuing for scapular retraction   5x STS Test: 12.85 Seconds   PATIENT EDUCATION:  Education details: Intrinsic foot muscle activation, proximal hip musculature activation, pelvic tilt correction education Person educated: Patient Education method: Explanation Education comprehension: verbalized understanding  HOME EXERCISE PROGRAM:  Access Code: [email or web address] URL: [email or web address] Date:  06/09/2023 Prepared by: Maylon Peppers  Exercises - Shoulder External Rotation and Scapular Retraction with Resistance  - 2-3 x daily - 5-7 x weekly - 3 sets - 10 reps - Standing Shoulder External Rotation with Resistance  - 2-3 x daily - 5-7 x weekly - 3 sets - 10 reps - Standing Row with Anchored Resistance  - 2-3 x daily - 5-7 x weekly - 3 sets - 10 reps - Shoulder W - External Rotation with Resistance  - 2-3 x daily - 5-7 x weekly - 3 sets - 10 reps - Standing 3-Way Leg Reach with Resistance at Ankles and Counter Support  - 2-3 x daily - 5-7 x weekly - 3 sets - 10 reps - Standing Hip Abduction Kicks  - 2-3 x daily - 5-7 x weekly - 3 sets - 10 reps   ASSESSMENT:  CLINICAL IMPRESSION:  Pt. Arrived to PT treatment session early and prepared. Today's treatment session focused on progressing global strength and balance activities to challenge B LE after pt. had a short break from PT treatments due to winter weather. Pt. Progressed to performing more single leg balance activities and was challenged with single leg BOSU ball step-ups most. Pt. Is continually challenged with recovery step training and B dorsiflexion during gait. Pt. Worked hard during treatment session to complete all exercises. Pt. Will benefit from skilled PT services to increase global proprioception and motor coordination to decrease their fear of falling.     OBJECTIVE IMPAIRMENTS: decreased balance.   ACTIVITY LIMITATIONS: Walking in rough terrain  PARTICIPATION LIMITATIONS: community activity  PERSONAL FACTORS: Fitness are also affecting patient's functional outcome.   REHAB POTENTIAL: Good  CLINICAL DECISION MAKING: Stable/uncomplicated  EVALUATION COMPLEXITY: Moderate   GOALS: Goals reviewed with patient? Yes  SHORT TERM GOALS: Target date: 06/29/2023  Patient will be able to stand on AirEx pad for 20 seconds without LOB to demonstrate increased LE muscle coordination/proprioception.  Baseline: 8  Seconds Goal status: INITIAL  2.  Patient will increase standing functional reach measure from 4" to 6" as tested on the BERG balance scale to demonstrate improved UE muscle coordination.  Baseline: 6" Goal status: Initial    LONG TERM GOALS: Target date: 07/19/2023  Pt. Will be able to stand in eyes-closed  tandem for greater than 15 seconds to demonstrate increased global proprioception.  Baseline: 7 Seconds Goal status: INITIAL  2.  Pt. Will be able to walk one mile on a treadmill with PT provided random perturbation to demonstrate increased confidence when walking through difficult terrains.   Baseline: TBD Goal status: INITIAL  3.  Pt. Will increase proximal hip MMT scores from 3/5 to 4/5 (ABD, ER, IR, EXT) to demonstrate increased global LE strength and motor coordination.  Baseline: See above Goal status: INITIAL  4.  Pt. Will increase periscapular muscular strength to 4/5 to increase confidence using B UE for fall recovery strategies.  Baseline: NT Goal status: INITIAL   PLAN:  PT FREQUENCY: 2x/week  PT DURATION: 6 weeks  PLANNED INTERVENTIONS: 97110-Therapeutic exercises, 97530- Therapeutic activity, [id number]- Neuromuscular re-education, 97535- Self Care, 16109- Manual therapy, and 97116- Gait training  PLAN FOR NEXT SESSION: Re-assess HEP and progress balance exercises difficulty.   CHECK GOALS   Cammie Mcgee, PT, DPT # [phone] Physical Therapist - [facility]  [place], [place]  07/05/2023, 9:57 AM

## 2023-07-07 ENCOUNTER — Encounter: Payer: Self-pay | Admitting: Physical Therapy

## 2023-07-07 ENCOUNTER — Ambulatory Visit: Payer: Medicare Other | Admitting: Physical Therapy

## 2023-07-07 DIAGNOSIS — R2681 Unsteadiness on feet: Secondary | ICD-10-CM

## 2023-07-07 DIAGNOSIS — M6281 Muscle weakness (generalized): Secondary | ICD-10-CM

## 2023-07-07 DIAGNOSIS — R29898 Other symptoms and signs involving the musculoskeletal system: Secondary | ICD-10-CM

## 2023-07-07 DIAGNOSIS — R2689 Other abnormalities of gait and mobility: Secondary | ICD-10-CM | POA: Diagnosis not present

## 2023-07-07 NOTE — Therapy (Signed)
 OUTPATIENT PHYSICAL THERAPY LOWER EXTREMITY TREATMENT  Patient Name: Hailey Cain MRN: [id number] DOB:[birth year], 78 y.o., female Today's Date: 07/07/2023  END OF SESSION:  PT End of Session - 07/07/23 0843     Visit Number 7    Number of Visits 12    Date for PT Re-Evaluation 07/19/23    PT Start Time 0900    PT Stop Time 0948    PT Time Calculation (min) 48 min    Equipment Utilized During Treatment Gait belt             Past Medical History:  Diagnosis Date   Acid reflux 12/30/2006   Actinic keratosis    Adult hypothyroidism 01/13/2007   Allergic rhinitis 09/07/2014   Anxiety 12/10/2014   Basal cell carcinoma 2013   left nose, > 10 years ago   BCC (basal cell carcinoma), arm, right 12/14/2022   Right medial arm. Nodular pattern. Excised 01/20/2023   Bronchitis 05/17/2015   Cancer of cheek (HCC) 07/04/2007   Depression, neurotic 12/30/2006   Dysplastic nevus 12/16/2021   left lower chest moderate atypia   Hyperlipidemia    Prediabetes 09/07/2014   RAD (reactive airway disease) 09/07/2014   Small bowel obstruction (HCC) 01/31/2017   Past Surgical History:  Procedure Laterality Date   ABDOMINAL HYSTERECTOMY  1988   heavy, irregular menses; ovaries intact   AUGMENTATION MAMMAPLASTY Bilateral    silicone   BREAST SURGERY  1970   augmentation   CATARACT EXTRACTION Bilateral 2013   Dr. Vonna Kotyk.  [place]o.  OD-Distance.   EYE SURGERY     cataract extraction   EYE SURGERY  2007   LASIK   MEMBRANE PEEL Left 05/25/2019   Procedure: MEMBRANE PEEL;  Surgeon: Rennis Chris, MD;  Location: [facility] OR;  Service: Ophthalmology;  Laterality: Left;   PARS PLANA VITRECTOMY Left 05/25/2019   Procedure: PARS PLANA VITRECTOMY WITH 25 GAUGE;  Surgeon: Rennis Chris, MD;  Location: [facility] OR;  Service: Ophthalmology;  Laterality: Left;   TONSILLECTOMY AND ADENOIDECTOMY  1950   TUBAL LIGATION     YAG LASER APPLICATION Left 03/24/2019   Dr. Rennis Chris   Patient Active Problem List    Diagnosis Date Noted   Impairment of balance 05/10/2023   Dizziness on standing 05/10/2023   Polyneuropathy associated with underlying disease (HCC) 05/10/2023   Memory changes 03/12/2022   Recurrent depression (HCC) 11/12/2021   Skin lesion 11/12/2021   Stiff back 06/19/2021   Low back pain 06/06/2021   Numbness of lower limb 06/06/2021   History of small bowel obstruction 01/31/2017   Anxiety 12/10/2014   Osteoporosis 11/06/2014   Allergic rhinitis 09/07/2014   Lacunar infarction (HCC) 09/07/2014   RAD (reactive airway disease) 09/07/2014   Prediabetes 09/07/2014   Cancer of cheek (HCC) 07/04/2007   Adult hypothyroidism 01/13/2007   Narrowing of intervertebral disc space 12/30/2006   GERD (gastroesophageal reflux disease) 12/30/2006   Fam hx-ischem heart disease 12/30/2006   History of tobacco use 12/30/2006   Hypercholesterolemia without hypertriglyceridemia 12/30/2006    PCP:   Sherlyn Hay, DO    REFERRING PROVIDER:   Sherlyn Hay, DO    REFERRING DIAG: Balance concerns and global loss of strength  THERAPY DIAG:  General unsteadiness  Impaired strength of lower extremity  Other abnormalities of gait and mobility  Muscle weakness (generalized)  Rationale for Evaluation and Treatment: Rehabilitation  ONSET DATE: 6 Months ago, on and off episodes of falling  SUBJECTIVE:   SUBJECTIVE STATEMENT:  Patient arrives to PT clinic on time and prepared for evaluation. Pt. Explains that over the past 6 months they have fallen 1-2/month that have made them concerned for their safety when ambulating. Pt. Helps their daughter take care of her two grandchildren and states she would like to make sure she can keep doing so. Pt. Presents concerns of having balance issues in the past and wanting to work towards increasing their leg and upper body strength. Pt. Describes that their past falls have always been forward, and have been accompanied by a feeling of "dizziness."  Pt. Expresses that they want to regain confidence with community ambulation and continue to be independent with all ADLs.   PERTINENT HISTORY: See PMH in records PAIN:  Are you having pain? No  PRECAUTIONS: None  RED FLAGS: None   WEIGHT BEARING RESTRICTIONS: No  FALLS:  Has patient fallen in last 6 months? Yes. Number of falls 3  LIVING ENVIRONMENT: Lives with: lives with their family and lives alone Lives in: Mobile home Stairs: No Has following equipment at home: None  OCCUPATION: Retired  PLOF: Independent  PATIENT GOALS:   Regain confidence walking independently with grandchildren.  NEXT MD VISIT: NA  OBJECTIVE:   PT performed vestibular screening using Dix Halpike maneuver, no significant findings noted.  Pt. Completed BERG and LEFS  Pt. Demonstrated 100 ft. Of unassisted walking and showed a decreased stride length.   Pt. Showed a decreased arm-swing with cautious gait.   Pt. Was strength tested for bilateral LE (see chart)    Note: Objective measures were completed at Evaluation unless otherwise noted.  DIAGNOSTIC FINDINGS:   Brain MRI ordered  PATIENT SURVEYS:  LEFS 63 BERG 54/56  COGNITION: Overall cognitive status: Within functional limits for tasks assessed     SENSATION: WFL  EDEMA:   No presence  MUSCLE LENGTH:  NT  POSTURE: rounded shoulders  PALPATION:   Bilateral medial and lateral ankle, WNL  LOWER EXTREMITY MMT:  MMT Right eval Left eval  Hip flexion 4 4  Hip extension 4 4  Hip abduction 3 3  Hip adduction 4 4  Hip internal rotation 4 4  Hip external rotation 4 4  Knee flexion 4 4  Knee extension 4 4  Ankle dorsiflexion 3 3  Ankle plantarflexion 4- 4  Ankle inversion 4- 4  Ankle eversion 4- 4   (Blank rows = not tested)  LOWER EXTREMITY SPECIAL TESTS:  NT  FUNCTIONAL TESTS:  Berg Balance Scale: 54/56  Grip Strength: Handgrip Dynamometer Left 26 Lbs  Right 33 Lbs  GAIT: Distance walked: 150 ft.  In clinic hallway Assistive device utilized: None Level of assistance: Complete Independence Comments: Shortened stride length.  5x STS Test: 12.85 Seconds                                                                         TREATMENT DATE: 07/07/2023  Subjective:  Pt. Arrives to treatment with NPS 0/10 and states that they had no episodes of falling but experienced some dizziness yesterday when doing house chores. Pt. Notes having a MD appointment on 07/09/2023 for a thyroid check. Pt. Remarks they are still doing their HEP once per day and notes RPE 3/10 when  doing exercises.  Therapeutic Exercise:  PT Guided patient through new HEP (see attached)  BOSU Ball step up in //-bar with CGA. 3x2 minutes. RPE 8    YTB Monster walks 10 //-bar laps x 2. Cuing for increase hip/knee flexion and slowing down movement.   There.act.:  //-Bar AirEx Pad tandem balance static standing 5x30 seconds with body blade. (Cue to limit UE assistance and bend knees)  //-Bar Hurdle step over. 2 12" hurdles and 2 6" hurdles. BOSU ball step up at one end.   Nautilus Resisted walking 30# forward/backwards/lateral 10x each direction. Cue to control resistance with LE rather than lumbar spine)  Nautilus D-handle Row:  Mid-row 30# 3x10. Cuing for scapular retraction    NOT TODAY: Standing B 4# dumbbell bicep curls 2x20 B. RPE 4/10  YTB Lateral stepping 6 laps of 30 ft. Cuing for maintaining neutral pelvis.    PATIENT EDUCATION:  Education details: Intrinsic foot muscle activation, proximal hip musculature activation, pelvic tilt correction education Person educated: Patient Education method: Explanation Education comprehension: verbalized understanding  HOME EXERCISE PROGRAM:  Access Code: [email or web address] URL: [email or web address] Date: 06/09/2023 Prepared by: Maylon Peppers  Exercises - Shoulder External Rotation and Scapular Retraction with Resistance  - 2-3 x daily - 5-7 x weekly - 3  sets - 10 reps - Standing Shoulder External Rotation with Resistance  - 2-3 x daily - 5-7 x weekly - 3 sets - 10 reps - Standing Row with Anchored Resistance  - 2-3 x daily - 5-7 x weekly - 3 sets - 10 reps - Shoulder W - External Rotation with Resistance  - 2-3 x daily - 5-7 x weekly - 3 sets - 10 reps - Standing 3-Way Leg Reach with Resistance at Ankles and Counter Support  - 2-3 x daily - 5-7 x weekly - 3 sets - 10 reps - Standing Hip Abduction Kicks  - 2-3 x daily - 5-7 x weekly - 3 sets - 10 reps   Access Code: [email or web address] URL: [email or web address] Date: 07/07/2023 Prepared by: Dorene Grebe Exercises - Single Leg Balance with Alternate Arm Raise - 1 x daily - 7 x weekly - 3 sets - 10 reps - Single Leg Balance with Leg Semicircles and Resistance - 1 x daily - 7 x weekly - 3 sets - 10 reps - Standing Tandem Balance with Counter Support - 1 x daily - 7 x weekly - 3 sets - 10 reps - Lunge with Counter Support - 1 x daily - 7 x weekly - 3 sets - 10 reps - Bird Dog on Counter - 1 x daily - 7 x weekly - 3 sets - 10 reps - Squat and Scaption with Resistance - 1 x daily - 7 x weekly - 3 sets - 10 reps - Standing Shoulder Horizontal Abduction with Resistance - 1 x daily - 7 x weekly - 3 sets - 10 reps - Shoulder W - External Rotation with Resistance - 1 x daily - 7 x weekly - 3 sets - 10 reps    ASSESSMENT:  CLINICAL IMPRESSION:  Pt. Arrived to PT treatment session early and prepared. Today's treatment session focused on progressing HEP exercises and advancing balance training. Pt. Worked hard throughout their treatment session. Pt. Marked improvement with single leg BOSU ball step ups and resisted walking by demonstrating more slow/controlled LE movement. Pt. Exhibited several instances of catching their feet on one another, specifically when their rearfoot's toe would contact their forward foot's heel during tandem walking. PT Educated  pt. About increasing step width and stride length to  limit LOB episodes related to catching themselves on their feet. PT Updated HEP to include more balance activities and periscapular strengthening.Pt. Met two short term goals during today's treatment related to forward reach and balancing on unstable surfaces unsupported for time (see short term goals). Pt. Will benefit from skilled PT services to increase global proprioception and motor coordination to decrease their fear of falling.   OBJECTIVE IMPAIRMENTS: decreased balance.   ACTIVITY LIMITATIONS: Walking in rough terrain  PARTICIPATION LIMITATIONS: community activity  PERSONAL FACTORS: Fitness are also affecting patient's functional outcome.   REHAB POTENTIAL: Good  CLINICAL DECISION MAKING: Stable/uncomplicated  EVALUATION COMPLEXITY: Moderate   GOALS: Goals reviewed with patient? Yes  SHORT TERM GOALS: Target date: 06/29/2023  Patient will be able to stand on AirEx pad for 20 seconds with eyes-closed without LOB to demonstrate increased LE muscle coordination/proprioception.  Baseline: 8 Seconds Goal status: Met 07/07/2023 (25+ seconds)  2.  Patient will increase standing functional reach measure from 4" to 6" as tested on the BERG balance scale to demonstrate improved UE muscle coordination.  Baseline: 6" Goal status: Met 07/07/2023 (9.5" B)   LONG TERM GOALS: Target date: 07/19/2023  Pt. Will be able to stand in eyes-closed tandem for greater than 15 seconds to demonstrate increased global proprioception.  Baseline: 7 Seconds Goal status: INITIAL  2.  Pt. Will be able to walk one mile on a treadmill with PT provided random perturbation to demonstrate increased confidence when walking through difficult terrains.   Baseline: TBD Goal status: INITIAL  3.  Pt. Will increase proximal hip MMT scores from 3/5 to 4/5 (ABD, ER, IR, EXT) to demonstrate increased global LE strength and motor coordination.  Baseline: See above Goal status: INITIAL  4.  Pt. Will increase  periscapular muscular strength to 4/5 to increase confidence using B UE for fall recovery strategies.  Baseline: NT Goal status: INITIAL   PLAN:  PT FREQUENCY: 2x/week  PT DURATION: 6 weeks  PLANNED INTERVENTIONS: 97110-Therapeutic exercises, 97530- Therapeutic activity, [id number]- Neuromuscular re-education, 97535- Self Care, 16109- Manual therapy, and 97116- Gait training  PLAN FOR NEXT SESSION: Progress activities challenging LE proprioception usig mirror feedback.    Cammie Mcgee, PT, DPT # [phone] Physical Therapist - [facility]  [place], [place]  07/07/2023, 12:27 PM

## 2023-07-09 ENCOUNTER — Encounter: Payer: Self-pay | Admitting: Family Medicine

## 2023-07-09 ENCOUNTER — Ambulatory Visit: Payer: Medicare Other | Admitting: Family Medicine

## 2023-07-09 VITALS — BP 125/55 | HR 75 | Resp 18 | Ht 62.0 in | Wt 106.3 lb

## 2023-07-09 DIAGNOSIS — E039 Hypothyroidism, unspecified: Secondary | ICD-10-CM | POA: Diagnosis not present

## 2023-07-09 DIAGNOSIS — R7303 Prediabetes: Secondary | ICD-10-CM | POA: Diagnosis not present

## 2023-07-09 DIAGNOSIS — R2689 Other abnormalities of gait and mobility: Secondary | ICD-10-CM

## 2023-07-09 DIAGNOSIS — G63 Polyneuropathy in diseases classified elsewhere: Secondary | ICD-10-CM

## 2023-07-09 DIAGNOSIS — F339 Major depressive disorder, recurrent, unspecified: Secondary | ICD-10-CM | POA: Diagnosis not present

## 2023-07-09 DIAGNOSIS — E7800 Pure hypercholesterolemia, unspecified: Secondary | ICD-10-CM | POA: Diagnosis not present

## 2023-07-09 DIAGNOSIS — R413 Other amnesia: Secondary | ICD-10-CM

## 2023-07-09 MED ORDER — FLUOXETINE HCL 20 MG PO CAPS: ORAL_CAPSULE | ORAL | 3 refills | Status: DC

## 2023-07-09 NOTE — Progress Notes (Signed)
 Established patient visit   Patient: Hailey Cain   DOB: November 13, 1945   78 y.o. Female  MRN: [id number] Visit Date: 07/09/2023  Today's healthcare provider: Sherlyn Hay, DO   Chief Complaint  Patient presents with   Follow-up    After dexa, MSE   Subjective    HPI Last annual 10/29/2022  Hailey Cain is a 78 year old female who presents for a follow-up visit.  She has a history of falls and finds physical therapy beneficial in managing them. There have been no recent falls, indicating improvement.  She has a history of mini strokes, noted on an MRI conducted in 2015. She recalls having another MRI at an orthopedic facility due to falls more recently, which also showed a small stroke, but she does not remember the exact date. Unable to locate in chart. No new symptoms suggestive of a stroke have been reported.  She experiences neuropathy symptoms, describing a sensation of wearing socks or shoes even when she is not, and numbness in her feet. She takes Gabapentin to help her rest at night, although she takes one tablet instead of the prescribed three due to side effects.  Her current medications include fluoxetine, two 20 mg capsules daily, which was increased to three tablets during a stressful period when her husband was ill and later reduced back to two tablets after his passing. She also takes levothyroxine, 50 mcg and half of a 25 mcg tablet daily.  She has received her first shingles vaccine and a COVID booster, and she regularly gets flu shots.  She is trying to cut down on sweets and salt in her diet.     Medications: Outpatient Medications Prior to Visit  Medication Sig   alendronate (FOSAMAX) 70 MG tablet TAKE 1 TABLET ONCE A WEEK WITH A FULL GLASS OF WATER   aspirin EC 81 MG tablet Take 81 mg by mouth daily. Swallow whole.   atorvastatin (LIPITOR) 20 MG tablet TAKE 1 TABLET AT BEDTIME   Calcium Carbonate (CALCIUM 600 PO) Take 600 mg by  mouth 2 (two) times a week.    Cholecalciferol (VITAMIN D-3) 25 MCG (1000 UT) CAPS Take 1,000 Units by mouth daily.   gabapentin (NEURONTIN) 100 MG capsule Once Daily   levothyroxine (SYNTHROID) 25 MCG tablet Take 0.5 tablets (12.5 mcg total) by mouth daily before breakfast.   levothyroxine (SYNTHROID) 50 MCG tablet Take 1 tablet (50 mcg total) by mouth daily before breakfast.   Multiple Vitamins-Minerals (EMERGEN-C IMMUNE PO) Take 1 tablet by mouth daily as needed (immunity boost).    omeprazole (PRILOSEC) 20 MG capsule TAKE 1 CAPSULE EVERY DAY   vitamin B-12 (CYANOCOBALAMIN) 500 MCG tablet Take 500 mcg by mouth daily.   [DISCONTINUED] FLUoxetine (PROZAC) 20 MG capsule TAKE 2 CAPSULES EVERY DAY   No facility-administered medications prior to visit.        Objective    BP (!) 125/55   Pulse 75   Resp 18   Ht 5\' 2"  (1.575 m)   Wt 106 lb 4.8 oz (48.2 kg)   SpO2 100%   BMI 19.44 kg/m     Physical Exam Vitals and nursing note reviewed.  Constitutional:      General: She is not in acute distress.    Appearance: Normal appearance.  HENT:     Head: Normocephalic and atraumatic.  Eyes:     General: No scleral icterus.    Conjunctiva/sclera: Conjunctivae normal.  Cardiovascular:  Rate and Rhythm: Normal rate.  Pulmonary:     Effort: Pulmonary effort is normal.  Neurological:     Mental Status: She is alert and oriented to person, place, and time. Mental status is at baseline.  Psychiatric:        Mood and Affect: Mood normal.        Behavior: Behavior normal.      No results found for any visits on 07/09/23.  Assessment & Plan    Prediabetes Assessment & Plan: Concerned about neuropathy possibly being related to diabetes. Last A1c in 2021 was within normal limits. Neuropathy can have multiple causes, with diabetes being a common one. - Check A1c level today  Orders: -     Hemoglobin A1c  Adult hypothyroidism Assessment & Plan: On levothyroxine. Dose decreased  at last visit. Reports slight weight gain since last visit. Regular monitoring of thyroid levels is essential to ensure appropriate dosing. - Recheck thyroid levels  Orders: -     TSH + free T4  Hypercholesterolemia without hypertriglyceridemia -     Comprehensive metabolic panel -     Lipid panel  Recurrent depression (HCC) Assessment & Plan: Currently taking two 20 mg capsules of fluoxetine daily. Previously on a higher dose due to caregiving stress but reduced the dose after her husband's passing. Reports doing well on the current dose. Fluoxetine is effective for managing anxiety and depression with a favorable safety profile. - Send prescription for fluoxetine 20 mg, two capsules daily  Orders: -     FLUoxetine HCl; TAKE 2 CAPSULES EVERY DAY  Dispense: 180 capsule; Refill: 3  Polyneuropathy associated with underlying disease (HCC) Assessment & Plan: Reports numbness in feet, described as feeling like wearing socks. Gabapentin, one tablet daily, helps her rest at night. Gabapentin is effective for neuropathic pain but can cause drowsiness. - Continue gabapentin as prescribed by outside provider   Impairment of balance Assessment & Plan: Reports improvement with physical therapy. No new falls reported. Physical therapy is non-invasive with minimal risks and can significantly improve balance and reduce fall risk. - Continue physical therapy   Memory changes Assessment & Plan: Stable. MSE 30/30 today. Continue to monitor.   MRI Follow-up History of mini-strokes identified on previous MRIs. Has not had an MRI recently and is unsure of the exact date of her last MRI. No immediate action required as she is doing well with physical therapy.  MMSE 30/30 today. - No immediate action required  Shingles Vaccination Due for her second shingles shot. Informed that Medicare does not cover the vaccine at the clinic, and she will need to get it at the pharmacy. Shingles vaccination  reduces the risk of shingles and its complications by about 90%. - Get second shingles shot at the pharmacy  General Health Maintenance Received COVID booster and flu shot. Due for physical and annual wellness visit in June. Regular vaccinations and screenings are essential for preventive health. - Perform lab work today - Keep appointment annual wellness visit in June   Return in about 4 months (around 11/06/2023) for CPE.      I discussed the assessment and treatment plan with the patient  The patient was provided an opportunity to ask questions and all were answered. The patient agreed with the plan and demonstrated an understanding of the instructions.   The patient was advised to call back or seek an in-person evaluation if the symptoms worsen or if the condition fails to improve as anticipated.  Sherlyn Hay, DO  [facility] [phone] (phone) [phone] (fax)  [facility]

## 2023-07-09 NOTE — Assessment & Plan Note (Signed)
 Reports numbness in feet, described as feeling like wearing socks. Gabapentin, one tablet daily, helps her rest at night. Gabapentin is effective for neuropathic pain but can cause drowsiness. - Continue gabapentin as prescribed by outside provider

## 2023-07-09 NOTE — Assessment & Plan Note (Signed)
 Stable. MSE 30/30 today. Continue to monitor.

## 2023-07-09 NOTE — Assessment & Plan Note (Signed)
 Currently taking two 20 mg capsules of fluoxetine daily. Previously on a higher dose due to caregiving stress but reduced the dose after her husband's passing. Reports doing well on the current dose. Fluoxetine is effective for managing anxiety and depression with a favorable safety profile. - Send prescription for fluoxetine 20 mg, two capsules daily

## 2023-07-09 NOTE — Assessment & Plan Note (Signed)
 Reports improvement with physical therapy. No new falls reported. Physical therapy is non-invasive with minimal risks and can significantly improve balance and reduce fall risk. - Continue physical therapy

## 2023-07-09 NOTE — Assessment & Plan Note (Addendum)
 On levothyroxine. Dose decreased at last visit. Reports slight weight gain since last visit. Regular monitoring of thyroid levels is essential to ensure appropriate dosing. - Recheck thyroid levels

## 2023-07-09 NOTE — Assessment & Plan Note (Signed)
 Concerned about neuropathy possibly being related to diabetes. Last A1c in 2021 was within normal limits. Neuropathy can have multiple causes, with diabetes being a common one. - Check A1c level today

## 2023-07-10 LAB — LIPID PANEL
Chol/HDL Ratio: 2.3 ratio (ref 0.0–4.4)
Cholesterol, Total: 168 mg/dL (ref 100–199)
HDL: 74 mg/dL (ref >39)
LDL Chol Calc (NIH): 74 mg/dL (ref 0–99)
Triglycerides: 112 mg/dL (ref 0–149)
VLDL Cholesterol Cal: 20 mg/dL (ref 5–40)

## 2023-07-10 LAB — COMPREHENSIVE METABOLIC PANEL
ALT: 12 IU/L (ref 0–32)
AST: 23 IU/L (ref 0–40)
Albumin: 4.2 g/dL (ref 3.8–4.8)
Alkaline Phosphatase: 37 IU/L — ABNORMAL LOW (ref 44–121)
BUN/Creatinine Ratio: 16 (ref 12–28)
BUN: 12 mg/dL (ref 8–27)
Bilirubin Total: 0.3 mg/dL (ref 0.0–1.2)
CO2: 23 mmol/L (ref 20–29)
Calcium: 9 mg/dL (ref 8.7–10.3)
Chloride: 103 mmol/L (ref 96–106)
Creatinine, Ser: 0.75 mg/dL (ref 0.57–1.00)
Globulin, Total: 1.8 g/dL (ref 1.5–4.5)
Glucose: 86 mg/dL (ref 70–99)
Potassium: 4.5 mmol/L (ref 3.5–5.2)
Sodium: 141 mmol/L (ref 134–144)
Total Protein: 6 g/dL (ref 6.0–8.5)
eGFR: 82 mL/min/{1.73_m2} (ref >59)

## 2023-07-10 LAB — HEMOGLOBIN A1C
Est. average glucose Bld gHb Est-mCnc: 114 mg/dL
Hgb A1c MFr Bld: 5.6 % (ref 4.8–5.6)

## 2023-07-10 LAB — TSH+FREE T4
Free T4: 1.02 ng/dL (ref 0.82–1.77)
TSH: 3.99 u[IU]/mL (ref 0.450–4.500)

## 2023-07-12 ENCOUNTER — Ambulatory Visit: Payer: Medicare Other | Attending: Family Medicine | Admitting: Physical Therapy

## 2023-07-12 ENCOUNTER — Encounter: Payer: Self-pay | Admitting: Physical Therapy

## 2023-07-12 DIAGNOSIS — R2681 Unsteadiness on feet: Secondary | ICD-10-CM | POA: Insufficient documentation

## 2023-07-12 DIAGNOSIS — R29898 Other symptoms and signs involving the musculoskeletal system: Secondary | ICD-10-CM | POA: Insufficient documentation

## 2023-07-12 DIAGNOSIS — M6281 Muscle weakness (generalized): Secondary | ICD-10-CM | POA: Insufficient documentation

## 2023-07-12 DIAGNOSIS — R2689 Other abnormalities of gait and mobility: Secondary | ICD-10-CM | POA: Diagnosis not present

## 2023-07-12 NOTE — Therapy (Signed)
 OUTPATIENT PHYSICAL THERAPY LOWER EXTREMITY TREATMENT/ DISCHARGE  Patient Name: Hailey Cain MRN: [id number] DOB:Jul 06, 1945, 78 y.o., female Today's Date: 07/12/2023  END OF SESSION:  PT End of Session - 07/12/23 0851     Visit Number 8    Number of Visits 12    Date for PT Re-Evaluation 07/19/23    PT Start Time 0851    PT Stop Time 0934    PT Time Calculation (min) 43 min    Equipment Utilized During Treatment Gait belt            Past Medical History:  Diagnosis Date   Acid reflux 12/30/2006   Actinic keratosis    Adult hypothyroidism 01/13/2007   Allergic rhinitis 09/07/2014   Anxiety 12/10/2014   Basal cell carcinoma 2013   left nose, > 10 years ago   BCC (basal cell carcinoma), arm, right 12/14/2022   Right medial arm. Nodular pattern. Excised 01/20/2023   Bronchitis 05/17/2015   Cancer of cheek (HCC) 07/04/2007   Depression, neurotic 12/30/2006   Dysplastic nevus 12/16/2021   left lower chest moderate atypia   Hyperlipidemia    Prediabetes 09/07/2014   RAD (reactive airway disease) 09/07/2014   Small bowel obstruction (HCC) 01/31/2017   Past Surgical History:  Procedure Laterality Date   ABDOMINAL HYSTERECTOMY  1988   heavy, irregular menses; ovaries intact   AUGMENTATION MAMMAPLASTY Bilateral    silicone   BREAST SURGERY  1970   augmentation   CATARACT EXTRACTION Bilateral 2013   Dr. Vonna Kotyk.  Mono.  OD-Distance.   EYE SURGERY     cataract extraction   EYE SURGERY  2007   LASIK   MEMBRANE PEEL Left 05/25/2019   Procedure: MEMBRANE PEEL;  Surgeon: Rennis Chris, MD;  Location: [facility] OR;  Service: Ophthalmology;  Laterality: Left;   PARS PLANA VITRECTOMY Left 05/25/2019   Procedure: PARS PLANA VITRECTOMY WITH 25 GAUGE;  Surgeon: Rennis Chris, MD;  Location: [facility] OR;  Service: Ophthalmology;  Laterality: Left;   TONSILLECTOMY AND ADENOIDECTOMY  1950   TUBAL LIGATION     YAG LASER APPLICATION Left 03/24/2019   Dr. Rennis Chris   Patient Active  Problem List   Diagnosis Date Noted   Impairment of balance 05/10/2023   Dizziness on standing 05/10/2023   Polyneuropathy associated with underlying disease (HCC) 05/10/2023   Memory changes 03/12/2022   Recurrent depression (HCC) 11/12/2021   Skin lesion 11/12/2021   Stiff back 06/19/2021   Low back pain 06/06/2021   Numbness of lower limb 06/06/2021   History of small bowel obstruction 01/31/2017   Anxiety 12/10/2014   Osteoporosis 11/06/2014   Allergic rhinitis 09/07/2014   Lacunar infarction (HCC) 09/07/2014   RAD (reactive airway disease) 09/07/2014   Prediabetes 09/07/2014   Cancer of cheek (HCC) 07/04/2007   Adult hypothyroidism 01/13/2007   Narrowing of intervertebral disc space 12/30/2006   GERD (gastroesophageal reflux disease) 12/30/2006   Fam hx-ischem heart disease 12/30/2006   History of tobacco use 12/30/2006   Hypercholesterolemia without hypertriglyceridemia 12/30/2006   PCP:   Sherlyn Hay, DO   REFERRING PROVIDER:   Sherlyn Hay, DO   REFERRING DIAG: Balance concerns and global loss of strength  THERAPY DIAG:  General unsteadiness  Impaired strength of lower extremity  Other abnormalities of gait and mobility  Muscle weakness (generalized)  Rationale for Evaluation and Treatment: Rehabilitation  ONSET DATE: 6 Months ago, on and off episodes of falling  SUBJECTIVE:   SUBJECTIVE STATEMENT:  Patient arrives to  PT clinic on time and prepared for evaluation. Pt. Explains that over the past 6 months they have fallen 1-2/month that have made them concerned for their safety when ambulating. Pt. Helps their daughter take care of her two grandchildren and states she would like to make sure she can keep doing so. Pt. Presents concerns of having balance issues in the past and wanting to work towards increasing their leg and upper body strength. Pt. Describes that their past falls have always been forward, and have been accompanied by a feeling of  "dizziness." Pt. Expresses that they want to regain confidence with community ambulation and continue to be independent with all ADLs.   PERTINENT HISTORY: See PMH in records PAIN:  Are you having pain? No  PRECAUTIONS: None  RED FLAGS: None   WEIGHT BEARING RESTRICTIONS: No  FALLS:  Has patient fallen in last 6 months? Yes. Number of falls 3  LIVING ENVIRONMENT: Lives with: lives with their family and lives alone Lives in: Mobile home Stairs: No Has following equipment at home: None  OCCUPATION: Retired  PLOF: Independent  PATIENT GOALS:   Regain confidence walking independently with grandchildren.  NEXT MD VISIT: NA  OBJECTIVE:   PT performed vestibular screening using Dix Halpike maneuver, no significant findings noted.  Pt. Completed BERG and LEFS  Pt. Demonstrated 100 ft. Of unassisted walking and showed a decreased stride length.   Pt. Showed a decreased arm-swing with cautious gait.   Pt. Was strength tested for bilateral LE (see chart)  Note: Objective measures were completed at Evaluation unless otherwise noted.  DIAGNOSTIC FINDINGS:   Brain MRI ordered  PATIENT SURVEYS:  LEFS 63 BERG 54/56  COGNITION: Overall cognitive status: Within functional limits for tasks assessed     SENSATION: WFL  EDEMA:   No presence  MUSCLE LENGTH:  NT  POSTURE: rounded shoulders  PALPATION:   Bilateral medial and lateral ankle, WNL  LOWER EXTREMITY MMT:  MMT Right eval Left eval  Hip flexion 4 4  Hip extension 4 4  Hip abduction 3 3  Hip adduction 4 4  Hip internal rotation 4 4  Hip external rotation 4 4  Knee flexion 4 4  Knee extension 4 4  Ankle dorsiflexion 3 3  Ankle plantarflexion 4- 4  Ankle inversion 4- 4  Ankle eversion 4- 4   (Blank rows = not tested)  LOWER EXTREMITY SPECIAL TESTS:  NT  FUNCTIONAL TESTS:  Berg Balance Scale: 54/56  Grip Strength: Handgrip Dynamometer Left 26 Lbs  Right 33 Lbs  GAIT: Distance  walked: 150 ft. In clinic hallway Assistive device utilized: None Level of assistance: Complete Independence Comments: Shortened stride length.  5x STS Test: 12.85 Seconds                                                                         TREATMENT DATE: 07/12/2023  Subjective:  Pt. Arrives to treatment with NPS 0/10 and states that they had no episodes of falling.  Pt. Remarks they are still doing their HEP once per day and notes RPE 3/10 when doing exercises.  No new complaints.    Therapeutic Exercise:  Scifit L6 10 min. B LE only.  Warm-up/ discussed weekend activities.  4# ankle wts.:  seated LAQ/ high marching in //-bars/ lateral walking in //-bars with light to no UE assist.  Standing heel raises 20x.  Walking alt. UE/LE touches in //-bars 6 laps.    BOSU Ball step up in //-bar with CGA. 3x2 minutes. RPE 6    There.act.:  //-Bar Hurdle step over with 6"/ 12" hurdles.  No loss of balance.    //-Bar AirEx Pad tandem balance in //-bars with no UE assist.    Nautilus Resisted walking 50# forward/backwards/lateral 8x each direction.  Cue to control resistance with LE rather than lumbar spine.  Reassessment of goals (met)- discharge  NOT TODAY: Standing B 4# dumbbell bicep curls 2x20 B. RPE 4/10  YTB Lateral stepping 6 laps of 30 ft. Cuing for maintaining neutral pelvis.   YTB Monster walks 10 //-bar laps x 2. Cuing for increase hip/knee flexion and slowing down movement.   Nautilus D-handle Row:  Mid-row 30# 3x10. Cuing for scapular retraction   PATIENT EDUCATION:  Education details: Intrinsic foot muscle activation, proximal hip musculature activation, pelvic tilt correction education Person educated: Patient Education method: Explanation Education comprehension: verbalized understanding  HOME EXERCISE PROGRAM:  Access Code: [email or web address] URL: [email or web address] Date: 06/09/2023 Prepared by: Maylon Peppers  Exercises - Shoulder External  Rotation and Scapular Retraction with Resistance  - 2-3 x daily - 5-7 x weekly - 3 sets - 10 reps - Standing Shoulder External Rotation with Resistance  - 2-3 x daily - 5-7 x weekly - 3 sets - 10 reps - Standing Row with Anchored Resistance  - 2-3 x daily - 5-7 x weekly - 3 sets - 10 reps - Shoulder W - External Rotation with Resistance  - 2-3 x daily - 5-7 x weekly - 3 sets - 10 reps - Standing 3-Way Leg Reach with Resistance at Ankles and Counter Support  - 2-3 x daily - 5-7 x weekly - 3 sets - 10 reps - Standing Hip Abduction Kicks  - 2-3 x daily - 5-7 x weekly - 3 sets - 10 reps   Access Code: [email or web address] URL: [email or web address] Date: 07/07/2023 Prepared by: Dorene Grebe Exercises - Single Leg Balance with Alternate Arm Raise - 1 x daily - 7 x weekly - 3 sets - 10 reps - Single Leg Balance with Leg Semicircles and Resistance - 1 x daily - 7 x weekly - 3 sets - 10 reps - Standing Tandem Balance with Counter Support - 1 x daily - 7 x weekly - 3 sets - 10 reps - Lunge with Counter Support - 1 x daily - 7 x weekly - 3 sets - 10 reps - Bird Dog on Counter - 1 x daily - 7 x weekly - 3 sets - 10 reps - Squat and Scaption with Resistance - 1 x daily - 7 x weekly - 3 sets - 10 reps - Standing Shoulder Horizontal Abduction with Resistance - 1 x daily - 7 x weekly - 3 sets - 10 reps - Shoulder W - External Rotation with Resistance - 1 x daily - 7 x weekly - 3 sets - 10 reps    ASSESSMENT:  CLINICAL IMPRESSION:  Pt. Arrived to PT treatment session early and prepared. Today's treatment session focused on progressing HEP exercises and advancing balance training. Pt. Worked hard throughout their treatment session. Pt. Marked improvement with single leg BOSU ball step ups and resisted walking by demonstrating more slow/controlled LE movement.  PT Educated pt. About increasing step width and stride length  to limit LOB episodes related to catching themselves on their feet.  Pt. Understands current  HEP and is planning to use ankle wts. At home.  Pt. Understands importance of daily walking and instructed to contact PT if any regression in symptoms or questions over the next several weeks after discharge from PT.    OBJECTIVE IMPAIRMENTS: decreased balance.   ACTIVITY LIMITATIONS: Walking in rough terrain  PARTICIPATION LIMITATIONS: community activity  PERSONAL FACTORS: Fitness are also affecting patient's functional outcome.   REHAB POTENTIAL: Good  CLINICAL DECISION MAKING: Stable/uncomplicated  EVALUATION COMPLEXITY: Moderate   GOALS: Goals reviewed with patient? Yes  SHORT TERM GOALS: Target date: 06/29/2023  Patient will be able to stand on AirEx pad for 20 seconds with eyes-closed without LOB to demonstrate increased LE muscle coordination/proprioception.  Baseline: 8 Seconds Goal status: Met 07/07/2023 (25+ seconds)  2.  Patient will increase standing functional reach measure from 4" to 6" as tested on the BERG balance scale to demonstrate improved UE muscle coordination.  Baseline: 6" Goal status: Met 07/07/2023 (9.5" B)   LONG TERM GOALS: Target date: 07/19/2023  Pt. Will be able to stand in eyes-closed tandem for greater than 15 seconds to demonstrate increased global proprioception.  Baseline: 7 Seconds Goal status:  Goal met  2.  Pt. Will be able to walk one mile on a treadmill with PT provided random perturbation to demonstrate increased confidence when walking through difficult terrains.   Baseline: TBD Goal status: Unable to reassess  3.  Pt. Will increase proximal hip MMT scores from 3/5 to 4/5 (ABD, ER, IR, EXT) to demonstrate increased global LE strength and motor coordination.  Baseline: See above Goal status: Goal met  4.  Pt. Will increase periscapular muscular strength to 4/5 to increase confidence using B UE for fall recovery strategies.  Baseline: NT Goal status: Goal met  PLAN:  PT FREQUENCY: 2x/week  PT DURATION: 6  weeks  PLANNED INTERVENTIONS: 97110-Therapeutic exercises, 97530- Therapeutic activity, [id number]- Neuromuscular re-education, 97535- Self Care, 96295- Manual therapy, and 97116- Gait training  PLAN FOR NEXT SESSION: Discharge   Cammie Mcgee, PT, DPT # [phone] Physical Therapist - [facility] 07/12/2023, 12:31 PM

## 2023-07-14 ENCOUNTER — Other Ambulatory Visit: Payer: Self-pay | Admitting: Family Medicine

## 2023-07-14 ENCOUNTER — Ambulatory Visit: Payer: Medicare Other | Admitting: Physical Therapy

## 2023-07-14 ENCOUNTER — Ambulatory Visit: Payer: Self-pay | Admitting: Family Medicine

## 2023-07-14 DIAGNOSIS — M810 Age-related osteoporosis without current pathological fracture: Secondary | ICD-10-CM

## 2023-07-14 DIAGNOSIS — E7800 Pure hypercholesterolemia, unspecified: Secondary | ICD-10-CM

## 2023-07-14 NOTE — Telephone Encounter (Signed)
   Info only: pt wanted to know lab results. E2C2 RN went over values and ranges with patient. Pt is asking since thyroid is within normal limits will there be an adjustment to medication. Pt had not furhter questions or concerns. Please advise.    Copied from CRM [id number]. Topic: Clinical - Lab/Test Results >> Jul 14, 2023  2:42 PM Antwanette L wrote: Reason for CRM: Patient calling in about lab result from 2/28 Reason for Disposition  Health Information question, no triage required and triager able to answer question  Answer Assessment - Initial Assessment Questions 1. REASON FOR CALL or QUESTION: "What is your reason for calling today?" or "How can I best help you?" or "What question do you have that I can help answer?"     Pt wanted to know lab results  Protocols used: Information Only Call - No Triage-A-AH

## 2023-07-15 ENCOUNTER — Encounter: Payer: Self-pay | Admitting: Family Medicine

## 2023-07-15 NOTE — Telephone Encounter (Signed)
 Requested Prescriptions  Pending Prescriptions Disp Refills   omeprazole (PRILOSEC) 20 MG capsule [Pharmacy Med Name: OMEPRAZOLE DR 20 MG CAPSULE 20 Capsule] 90 capsule 0    Sig: TAKE 1 CAPSULE BY MOUTH ONCE DAILY *NEW PRESCRIPTION REQUEST*     Gastroenterology: Proton Pump Inhibitors Passed - 07/15/2023  9:58 AM      Passed - Valid encounter within last 12 months    Recent Outpatient Visits           2 months ago Recurrent depression [facility])   [facility] Pardue, Monico Blitz, DO   8 months ago Annual physical exam   [facility] [name], PA-C   1 year ago COVID-19   [facility] [name], PA-C   1 year ago Memory changes   [place] [name], PA-C   1 year ago Depression, major, single episode, moderate (HCC)   [facility] [name], PA-C       Future Appointments             In 3 months Pardue, Monico Blitz, DO [facility]             FLUoxetine (PROZAC) 20 MG tablet [Pharmacy Med Name: FLUOXETINE 20MG  TAB 20 Tablet] 180 tablet 10    Sig: TAKE TWO (2) TABLETS BY MOUTH ONCE DAILY *NEW PRESCRIPTION REQUEST*     Psychiatry:  Antidepressants - SSRI Passed - 07/15/2023  9:58 AM      Passed - Completed PHQ-2 or PHQ-9 in the last 360 days      Passed - Valid encounter within last 6 months    Recent Outpatient Visits           2 months ago Recurrent depression [facility])   [facility] Pardue, Monico Blitz, DO   8 months ago Annual physical exam   [facility] [name], PA-C   1 year ago COVID-19   [facility] [name], PA-C   1 year ago Memory changes   [facility] Edwards, [place], PA-C   1 year ago Depression, major, single episode, moderate (HCC)   [facility] [name], PA-C       Future Appointments             In 3 months Pardue, Monico Blitz, DO [facility]             atorvastatin (LIPITOR) 20 MG tablet [Pharmacy Med Name: ATORVASTATIN 20MG  TAB 20 Tablet] 90 tablet 0    Sig: TAKE 1 TABLET BY MOUTH ONCE DAILY *NEW PRESCRIPTION REQUEST*     Cardiovascular:  Antilipid - Statins Failed - 07/15/2023  9:58 AM      Failed - Lipid Panel in normal range within the last 12 months    Cholesterol, Total  Date Value Ref Range Status  07/09/2023 168 100 - 199 mg/dL Final   LDL Chol Calc (NIH)  Date Value Ref Range Status  07/09/2023 74 0 - 99 mg/dL Final   HDL  Date Value Ref Range Status  07/09/2023 74 >39 mg/dL Final   Triglycerides  Date Value Ref Range Status  07/09/2023 112 0 - 149 mg/dL Final         Passed - Patient is not pregnant      Passed - Valid encounter  within last 12 months    Recent Outpatient Visits           2 months ago Recurrent depression [facility])   [facility] Pardue, Monico Blitz, DO   8 months ago Annual physical exam   [facility] [name], PA-C   1 year ago COVID-19   [facility] [place], [place], PA-C   1 year ago Memory changes   [facility] [name], PA-C   1 year ago Depression, major, single episode, moderate (HCC)   [place] [name], PA-C       Future Appointments             In 3 months Pardue, Monico Blitz, DO [facility]             alendronate (FOSAMAX) 70 MG tablet [Pharmacy Med Name: ALENDRONATE NA 70MG  TAB 70 Tablet] 12 tablet 10    Sig: TAKE 1 TABLET BY MOUTH ONCE WEEKLY ON FRIDAYS *NEW PRESCRIPTION REQUEST*     Endocrinology:  Bisphosphonates Failed - 07/15/2023  9:58 AM      Failed - Vitamin D in normal range and within 360 days    No results found for:  "IH4742VZ5", "GL8756EP3", "IR518AC1YSA", "25OHVITD3", "25OHVITD2", "25OHVITD1", "VD25OH"       Failed - Mg Level in normal range and within 360 days    Magnesium  Date Value Ref Range Status  10/10/2019 2.1 1.6 - 2.3 mg/dL Final         Failed - Phosphate in normal range and within 360 days    No results found for: "PHOS"       Failed - Bone Mineral Density or Dexa Scan completed in the last 2 years      Passed - Ca in normal range and within 360 days    Calcium  Date Value Ref Range Status  07/09/2023 9.0 8.7 - 10.3 mg/dL Final         Passed - Cr in normal range and within 360 days    Creatinine, Ser  Date Value Ref Range Status  07/09/2023 0.75 0.57 - 1.00 mg/dL Final         Passed - eGFR is 30 or above and within 360 days    GFR calc Af Amer  Date Value Ref Range Status  10/10/2019 102 >59 mL/min/1.73 Final    Comment:    **[facility] currently reports eGFR in compliance with the current**   recommendations of the [employer]. [facility] will   update reporting as new guidelines are published from the NKF-ASN   Task force.    GFR calc non Af Amer  Date Value Ref Range Status  10/10/2019 89 >59 mL/min/1.73 Final   eGFR  Date Value Ref Range Status  07/09/2023 82 >59 mL/min/1.73 Final         Passed - Valid encounter within last 12 months    Recent Outpatient Visits           2 months ago Recurrent depression [facility])   [facility] Pardue, Monico Blitz, DO   8 months ago Annual physical exam   [name], PA-C   1 year ago COVID-19   [facility] [name], PA-C   1 year ago Memory changes   [facility] [name], [place]   1  year ago Depression, major, single episode, moderate (HCC)   [place] [name], PA-C       Future Appointments             In 3 months Pardue, Monico Blitz, DO  [place] [facility]

## 2023-07-16 ENCOUNTER — Other Ambulatory Visit: Payer: Self-pay | Admitting: Family Medicine

## 2023-07-16 DIAGNOSIS — G63 Polyneuropathy in diseases classified elsewhere: Secondary | ICD-10-CM

## 2023-07-16 DIAGNOSIS — F339 Major depressive disorder, recurrent, unspecified: Secondary | ICD-10-CM

## 2023-07-16 NOTE — Telephone Encounter (Signed)
 Copied from CRM [id number]. Topic: Clinical - Medication Refill >> Jul 16, 2023  4:27 PM Fuller Mandril wrote: Most Recent Primary Care Visit:  Provider: Sherlyn Hay  Department: [facility]  Visit Type: OFFICE VISIT  Date: 07/09/2023  Medication:  levothyroxine (SYNTHROID) 50 MCG tablet levothyroxine (SYNTHROID) 25 MCG tablet gabapentin (NEURONTIN) 100 MG capsule FLUoxetine (PROZAC) 20 MG capsule  Has the patient contacted their pharmacy? Yes (Agent: If no, request that the patient contact the pharmacy for the refill. If patient does not wish to contact the pharmacy document the reason why and proceed with request.) (Agent: If yes, when and what did the pharmacy advise?) Pharmacy called to  request  Is this the correct pharmacy for this prescription? Yes If no, delete pharmacy and type the correct one.  [facility] - [address] [place] - [address] [place] [address] Phone: [phone] Fax: [phone]   Has the prescription been filled recently? N/A  Is the patient out of the medication? N/A  Has the patient been seen for an appointment in the last year OR does the patient have an upcoming appointment? Yes  Can we respond through MyChart? No  Agent: Please be advised that Rx refills may take up to 3 business days. We ask that you follow-up with your pharmacy.

## 2023-07-19 ENCOUNTER — Other Ambulatory Visit: Payer: Self-pay | Admitting: Family Medicine

## 2023-07-19 DIAGNOSIS — F339 Major depressive disorder, recurrent, unspecified: Secondary | ICD-10-CM

## 2023-07-19 DIAGNOSIS — E039 Hypothyroidism, unspecified: Secondary | ICD-10-CM

## 2023-07-19 DIAGNOSIS — E7800 Pure hypercholesterolemia, unspecified: Secondary | ICD-10-CM

## 2023-07-19 DIAGNOSIS — K219 Gastro-esophageal reflux disease without esophagitis: Secondary | ICD-10-CM

## 2023-07-19 MED ORDER — OMEPRAZOLE 20 MG PO CPDR
20.0000 mg | DELAYED_RELEASE_CAPSULE | Freq: Every day | ORAL | 3 refills | Status: AC
Start: 2023-07-19 — End: ?

## 2023-07-19 MED ORDER — LEVOTHYROXINE SODIUM 25 MCG PO TABS: 12.5000 ug | ORAL_TABLET | Freq: Every day | ORAL | 3 refills | Status: DC

## 2023-07-19 MED ORDER — FLUOXETINE HCL 20 MG PO CAPS: ORAL_CAPSULE | ORAL | 3 refills | Status: AC

## 2023-07-19 MED ORDER — ATORVASTATIN CALCIUM 20 MG PO TABS: 20.0000 mg | ORAL_TABLET | Freq: Every day | ORAL | 3 refills | Status: AC

## 2023-07-19 MED ORDER — LEVOTHYROXINE SODIUM 50 MCG PO TABS: 50.0000 ug | ORAL_TABLET | Freq: Every day | ORAL | 3 refills | Status: DC

## 2023-07-19 NOTE — Telephone Encounter (Signed)
 Requested medication (s) are due for refill today: yes  Requested medication (s) are on the active medication list: historical med  Last refill:  09/29/21  Future visit scheduled: yes   Notes to clinic:  historical provider   Requested Prescriptions  Pending Prescriptions Disp Refills   gabapentin (NEURONTIN) 100 MG capsule 30 capsule 0    Sig: Take 1 capsule (100 mg total) by mouth daily.     Neurology: Anticonvulsants - gabapentin Passed - 07/19/2023 11:26 AM      Passed - Cr in normal range and within 360 days    Creatinine, Ser  Date Value Ref Range Status  07/09/2023 0.75 0.57 - 1.00 mg/dL Final         Passed - Completed PHQ-2 or PHQ-9 in the last 360 days      Passed - Valid encounter within last 12 months    Recent Outpatient Visits           2 months ago Recurrent depression [facility])   [facility] Pardue, Monico Blitz, DO   8 months ago Annual physical exam   [facility] [name], PA-C   1 year ago COVID-19   [name], PA-C   1 year ago Memory changes   [facility] [facility] [name], PA-C   1 year ago Depression, major, single episode, moderate [facility])   [facility] [name], PA-C       Future Appointments             In 3 months Pardue, Monico Blitz, DO [facility]

## 2023-07-20 MED ORDER — GABAPENTIN 100 MG PO CAPS: 100.0000 mg | ORAL_CAPSULE | Freq: Every day | ORAL | 0 refills | Status: DC

## 2023-08-02 NOTE — Telephone Encounter (Signed)
 Patient brought letter from [employer] by for Dr. Payton Mccallum to read about her Alendronate RX.  Letter is placed in Dr. Rexanne Mano mailbox

## 2023-08-09 ENCOUNTER — Other Ambulatory Visit: Payer: Medicare Other

## 2023-08-11 ENCOUNTER — Other Ambulatory Visit: Payer: Self-pay | Admitting: Family Medicine

## 2023-08-11 DIAGNOSIS — M810 Age-related osteoporosis without current pathological fracture: Secondary | ICD-10-CM

## 2023-08-11 MED ORDER — ALENDRONATE SODIUM 70 MG PO TABS
70.0000 mg | ORAL_TABLET | ORAL | 10 refills | Status: DC
Start: 2023-08-11 — End: 2023-11-01

## 2023-09-07 ENCOUNTER — Ambulatory Visit
Admission: RE | Admit: 2023-09-07 | Discharge: 2023-09-07 | Disposition: A | Discharge status: Home / Self Care | Source: Ambulatory Visit | Attending: Family Medicine | Admitting: Family Medicine

## 2023-09-07 DIAGNOSIS — M810 Age-related osteoporosis without current pathological fracture: Secondary | ICD-10-CM | POA: Diagnosis not present

## 2023-09-07 DIAGNOSIS — Z780 Asymptomatic menopausal state: Secondary | ICD-10-CM | POA: Diagnosis not present

## 2023-09-13 ENCOUNTER — Encounter: Payer: Self-pay | Admitting: Family Medicine

## 2023-11-01 ENCOUNTER — Ambulatory Visit (INDEPENDENT_AMBULATORY_CARE_PROVIDER_SITE_OTHER): Payer: Medicare Other | Admitting: Family Medicine

## 2023-11-01 ENCOUNTER — Encounter: Payer: Self-pay | Admitting: Family Medicine

## 2023-11-01 VITALS — BP 128/66 | HR 82 | Resp 20 | Ht 61.0 in | Wt 107.4 lb

## 2023-11-01 DIAGNOSIS — K219 Gastro-esophageal reflux disease without esophagitis: Secondary | ICD-10-CM

## 2023-11-01 DIAGNOSIS — E7800 Pure hypercholesterolemia, unspecified: Secondary | ICD-10-CM

## 2023-11-01 DIAGNOSIS — Z0001 Encounter for general adult medical examination with abnormal findings: Secondary | ICD-10-CM

## 2023-11-01 DIAGNOSIS — H547 Unspecified visual loss: Secondary | ICD-10-CM | POA: Diagnosis not present

## 2023-11-01 DIAGNOSIS — K5909 Other constipation: Secondary | ICD-10-CM

## 2023-11-01 DIAGNOSIS — E039 Hypothyroidism, unspecified: Secondary | ICD-10-CM | POA: Diagnosis not present

## 2023-11-01 DIAGNOSIS — R200 Anesthesia of skin: Secondary | ICD-10-CM

## 2023-11-01 DIAGNOSIS — N182 Chronic kidney disease, stage 2 (mild): Secondary | ICD-10-CM | POA: Diagnosis not present

## 2023-11-01 DIAGNOSIS — R2689 Other abnormalities of gait and mobility: Secondary | ICD-10-CM

## 2023-11-01 DIAGNOSIS — Z0000 Encounter for general adult medical examination without abnormal findings: Secondary | ICD-10-CM

## 2023-11-01 DIAGNOSIS — Z79899 Other long term (current) drug therapy: Secondary | ICD-10-CM | POA: Diagnosis not present

## 2023-11-01 DIAGNOSIS — Z8249 Family history of ischemic heart disease and other diseases of the circulatory system: Secondary | ICD-10-CM

## 2023-11-01 NOTE — Progress Notes (Signed)
 Complete physical exam   Patient: Hailey Cain   DOB: 22-Aug-1945   78 y.o. Female  MRN: [id number] Visit Date: 11/01/2023  Today's healthcare provider: LAURAINE LOISE BUOY, DO   Chief Complaint  Patient presents with   Annual Exam    Sleeping Pattern: good Exercising: Daily, walks No concerns   Subjective    Hailey Cain is a 78 y.o. female who presents today for a complete physical exam.  She reports consuming a general diet which she tries to keep balanced. She exercises daily by walking her dog. She generally feels well. She reports sleeping well. She does not have additional problems to discuss today.   HPI HPI     Annual Exam    Additional comments: Sleeping Pattern: good Exercising: Daily, walks No concerns      Last edited by Wilfred Hargis RAMAN, CMA on 11/01/2023  8:57 AM.      Hailey Cain is a 78 year old female who presents for an annual physical exam.  She generally feels well with no major concerns. She has a history of headaches and recently experienced a fall while in the shower where she landed backwards, hitting the back of her neck slightly but without significant injury. She attributes the fall to her feet sliding out from under her, as she does not have anything to increase the grip under her feet in her shower.  She has a history of small intestine problems and experiences occasional constipation. She uses Metamucil but finds that a full scoop causes loose stools.  She is inquiring whether she can reduce the dose  She experiences intermittent numbness and tingling in her feet, legs, and sometimes her arms and hands, which can become cold. She uses a heating pad to alleviate these symptoms. She has tried gabapentin  for neuropathy but discontinued it due to excessive drowsiness.  She has vision issues, particularly in her left eye, where she uses drops for dryness. She previously had a procedure involving a bubble in her eye, which  required her to keep her head down for six weeks, affecting her sleep. She uses her left eye for reading and the right thigh for driving/seeing distances.  Her current medications include atorvastatin  for cholesterol, omeprazole , and thyroid  medication.  We discontinued her Fosamax  after her previous visit, as she had been taking it for five years of use. She does not have diabetes.  Hemodynamically reports sore throat likely due to mouth breathing at night.    Past Medical History:  Diagnosis Date   Acid reflux 12/30/2006   Actinic keratosis    Adult hypothyroidism 01/13/2007   Allergic rhinitis 09/07/2014   Anxiety 12/10/2014   Basal cell carcinoma 2013   left nose, > 10 years ago   BCC (basal cell carcinoma), arm, right 12/14/2022   Right medial arm. Nodular pattern. Excised 01/20/2023   Bronchitis 05/17/2015   Cancer of cheek (HCC) 07/04/2007   Depression, neurotic 12/30/2006   Dysplastic nevus 12/16/2021   left lower chest moderate atypia   Hyperlipidemia    Prediabetes 09/07/2014   RAD (reactive airway disease) 09/07/2014   Small bowel obstruction (HCC) 01/31/2017   Past Surgical History:  Procedure Laterality Date   ABDOMINAL HYSTERECTOMY  1988   heavy, irregular menses; ovaries intact   AUGMENTATION MAMMAPLASTY Bilateral    silicone   BREAST SURGERY  1970   augmentation   CATARACT EXTRACTION Bilateral 2013   Dr. Lavonia.  [place].  [facility].   EYE  SURGERY     cataract extraction   EYE SURGERY  2007   LASIK   MEMBRANE PEEL Left 05/25/2019   Procedure: MEMBRANE PEEL;  Surgeon: Valdemar Rogue, MD;  Location: [facility] OR;  Service: Ophthalmology;  Laterality: Left;   PARS PLANA VITRECTOMY Left 05/25/2019   Procedure: PARS PLANA VITRECTOMY WITH 25 GAUGE;  Surgeon: Valdemar Rogue, MD;  Location: [facility] OR;  Service: Ophthalmology;  Laterality: Left;   TONSILLECTOMY AND ADENOIDECTOMY  1950   TUBAL LIGATION     YAG LASER APPLICATION Left 03/24/2019   Dr. Rogue Valdemar   Social History    Socioeconomic History   Marital status: Widowed    Spouse name: Lynwood Charleston   Number of children: 1   Years of education: H/S   Highest education level: 12th grade  Occupational History   Occupation: retired  Tobacco Use   Smoking status: Former    Current packs/day: 0.00    Average packs/day: 1 pack/day for 20.0 years (20.0 ttl pk-yrs)    Types: Cigarettes    Start date: 05/12/1983    Quit date: 05/12/2003    Years since quitting: 20.4   Smokeless tobacco: Never  Vaping Use   Vaping status: Never Used  Substance and Sexual Activity   Alcohol use: No    Alcohol/week: 0.0 standard drinks of alcohol   Drug use: No   Sexual activity: Not on file  Other Topics Concern   Not on file  Social History Narrative   Not on file   Social Drivers of Health   Financial Resource Strain: Low Risk  (11/01/2023)   Overall Financial Resource Strain (CARDIA)    Difficulty of Paying Living Expenses: Not hard at all  Food Insecurity: No Food Insecurity (11/01/2023)   Hunger Vital Sign    Worried About Running Out of Food in the Last Year: Never true    Ran Out of Food in the Last Year: Never true  Transportation Needs: No Transportation Needs (11/01/2023)   PRAPARE - Administrator, Civil Service (Medical): No    Lack of Transportation (Non-Medical): No  Physical Activity: Sufficiently Active (11/01/2023)   Exercise Vital Sign    Days of Exercise per Week: 7 days    Minutes of Exercise per Session: 40 min  Stress: No Stress Concern Present (11/01/2023)   [name]    Feeling of Stress: Not at all  Social Connections: Moderately Isolated (10/06/2022)   Social Connection and Isolation Panel    Frequency of Communication with Friends and Family: More than three times a week    Frequency of Social Gatherings with Friends and Family: More than three times a week    Attends Religious Services: More than 4 times per year     Active Member of [employer] or Organizations: No    Attends Banker Meetings: Never    Marital Status: Widowed  Intimate Partner Violence: Not At Risk (11/01/2023)   Humiliation, Afraid, Rape, and Kick questionnaire    Fear of Current or Ex-Partner: No    Emotionally Abused: No    Physically Abused: No    Sexually Abused: No   Family Status  Relation Name Status   Mother  Deceased at age 29   Father  Deceased at age 87       Heart Attack   Sister  Alive   Brother  Alive   Brother  Alive  No partnership data on file  Family History  Problem Relation Age of Onset   Stroke Mother    CAD Mother    Heart attack Father    Hyperthyroidism Sister    Heart Problems Brother    Cancer Brother        skin   CAD Brother        CABG   No Known Allergies  Patient Care Team: Nasean Zapf N, DO as PCP - General (Family Medicine)   Medications: Outpatient Medications Prior to Visit  Medication Sig Note   aspirin EC 81 MG tablet Take 81 mg by mouth daily. Swallow whole.    atorvastatin  (LIPITOR) 20 MG tablet Take 1 tablet (20 mg total) by mouth daily.    Calcium  Carbonate (CALCIUM  600 PO) Take 600 mg by mouth 2 (two) times a week.     Cholecalciferol (VITAMIN D-3) 25 MCG (1000 UT) CAPS Take 1,000 Units by mouth daily.    FLUoxetine  (PROZAC ) 20 MG capsule TAKE 2 CAPSULES EVERY DAY    gabapentin  (NEURONTIN ) 100 MG capsule Take 1 capsule (100 mg total) by mouth daily.    levothyroxine  (SYNTHROID ) 25 MCG tablet Take 0.5 tablets (12.5 mcg total) by mouth daily before breakfast.    levothyroxine  (SYNTHROID ) 50 MCG tablet Take 1 tablet (50 mcg total) by mouth daily before breakfast.    Multiple Vitamins-Minerals (EMERGEN-C IMMUNE PO) Take 1 tablet by mouth daily as needed (immunity boost).     omeprazole  (PRILOSEC) 20 MG capsule Take 1 capsule (20 mg total) by mouth daily.    vitamin B-12 (CYANOCOBALAMIN) 500 MCG tablet Take 500 mcg by mouth daily.    [DISCONTINUED] alendronate   (FOSAMAX ) 70 MG tablet Take 1 tablet (70 mg total) by mouth once a week. Take with a full glass of water  on an empty stomach. 11/01/2023: Had been on it five years   No facility-administered medications prior to visit.    Review of Systems  Constitutional:  Negative for chills, fatigue and fever.  HENT:  Negative for congestion, ear pain, rhinorrhea, sneezing and sore throat.   Eyes:  Positive for visual disturbance (left-side vision reduced; previously advised she needs glasses; not wearing glasses today.). Negative for pain and redness.  Respiratory:  Negative for cough, shortness of breath and wheezing.   Cardiovascular:  Negative for chest pain and leg swelling.  Gastrointestinal:  Positive for constipation (was previously started on metamucil). Negative for abdominal pain, blood in stool, diarrhea and nausea.  Endocrine: Negative for polydipsia and polyphagia.  Genitourinary: Negative.  Negative for dysuria, flank pain, hematuria, pelvic pain, vaginal bleeding and vaginal discharge.  Musculoskeletal:  Negative for arthralgias, back pain, gait problem and joint swelling.  Skin:  Negative for rash.  Neurological:  Positive for headaches (chronic throughout life). Negative for dizziness, tremors, seizures, weakness, light-headedness and numbness.       +balance concern w/o dizziness/lightheadedness  Hematological:  Negative for adenopathy.  Psychiatric/Behavioral: Negative.  Negative for behavioral problems, confusion and dysphoric mood. The patient is not nervous/anxious and is not hyperactive.        Objective    BP 128/66 (BP Location: Left Arm, Patient Position: Sitting, Cuff Size: Normal)   Pulse 82   Resp 20   Ht 5' 1 (1.549 m)   Wt 107 lb 6.4 oz (48.7 kg)   SpO2 98%   BMI 20.29 kg/m    Physical Exam Vitals and nursing note reviewed.  Constitutional:      General: She is awake.     Appearance:  Normal appearance.  HENT:     Head: Normocephalic and atraumatic.      Right Ear: Tympanic membrane, ear canal and external ear normal.     Left Ear: Tympanic membrane, ear canal and external ear normal.     Nose: Nose normal.     Mouth/Throat:     Mouth: Mucous membranes are moist.     Pharynx: Oropharynx is clear. No oropharyngeal exudate or posterior oropharyngeal erythema.   Eyes:     General: No scleral icterus.    Extraocular Movements: Extraocular movements intact.     Conjunctiva/sclera: Conjunctivae normal.     Pupils: Pupils are equal, round, and reactive to light.   Neck:     Thyroid : No thyromegaly or thyroid  tenderness.   Cardiovascular:     Rate and Rhythm: Normal rate and regular rhythm.     Pulses: Normal pulses.     Heart sounds: Normal heart sounds.  Pulmonary:     Effort: Pulmonary effort is normal. No tachypnea, bradypnea or respiratory distress.     Breath sounds: Normal breath sounds. No stridor. No wheezing, rhonchi or rales.  Abdominal:     General: Bowel sounds are normal. There is no distension.     Palpations: Abdomen is soft. There is no mass.     Tenderness: There is no abdominal tenderness. There is no guarding.     Hernia: No hernia is present.   Musculoskeletal:     Cervical back: Normal range of motion and neck supple.     Right lower leg: No edema.     Left lower leg: No edema.  Lymphadenopathy:     Cervical: No cervical adenopathy.   Skin:    General: Skin is warm and dry.   Neurological:     Mental Status: She is alert and oriented to person, place, and time. Mental status is at baseline.   Psychiatric:        Mood and Affect: Mood normal.        Behavior: Behavior normal.     Last depression screening scores    11/01/2023    9:02 AM 07/09/2023    9:25 AM 05/07/2023    9:18 AM  PHQ 2/9 Scores  PHQ - 2 Score 0 0 0  PHQ- 9 Score 0 0 2   Last fall risk screening    11/01/2023    9:07 AM  Fall Risk   Falls in the past year? 1  Number falls in past yr: 0  Injury with Fall? 1   Last Audit-C  alcohol use screening    11/01/2023    8:59 AM  Alcohol Use Disorder Test (AUDIT)  1. How often do you have a drink containing alcohol? 0  2. How many drinks containing alcohol do you have on a typical day when you are drinking? 0  3. How often do you have six or more drinks on one occasion? 0  AUDIT-C Score 0   A score of 3 or more in women, and 4 or more in men indicates increased risk for alcohol abuse, EXCEPT if all of the points are from question 1   No results found for any visits on 11/01/23.  Assessment & Plan    Routine Health Maintenance and Physical Exam  Exercise Activities and Dietary recommendations  Goals      DIET - EAT MORE FRUITS AND VEGETABLES     Exercise      Recommend starting back exercising at the gym 3  days a week for 1 hour.      Exercise 3x per week (30 min per time)     Recommend to start back exercising 3 days a week for 30 minutes at a time. (Pt to start a silver sneakers class)        Immunization History  Administered Date(s) Administered   Influenza, High Dose Seasonal PF 02/13/2015, 02/08/2017, 02/04/2018   Influenza-Unspecified 02/06/2019, 02/03/2021, 01/28/2022   PFIZER Comirnaty(Gray Top)Covid-19 Tri-Sucrose Vaccine 10/21/2020   PFIZER(Purple Top)SARS-COV-2 Vaccination 07/07/2019, 08/04/2019   PNEUMOCOCCAL CONJUGATE-20 01/28/2023   Pfizer(Comirnaty)Fall Seasonal Vaccine 12 years and older 03/07/2022   Pneumococcal Conjugate-13 02/13/2015   Pneumococcal Polysaccharide-23 05/01/2017   Td 10/16/2010, 02/28/2018   Tdap 10/16/2010, 02/28/2018   Zoster Recombinant(Shingrix) 10/21/2020   Zoster, Live 10/27/2014    Health Maintenance  Topic Date Due   Medicare Annual Wellness (AWV)  12/29/2023 (Originally 10/06/2023)   COVID-19 Vaccine (6 - 2024-25 season) 01/10/2024 (Originally 01/10/2023)   INFLUENZA VACCINE  12/10/2023   DEXA SCAN  09/06/2025   DTaP/Tdap/Td (5 - Td or Tdap) 02/29/2028   Pneumococcal Vaccine: 50+ Years  Completed    Hepatitis C Screening  Completed   Zoster Vaccines- Shingrix  Completed   HPV VACCINES  Aged Out   Meningococcal B Vaccine  Aged Out   Colonoscopy  Discontinued    Discussed health benefits of physical activity, and encouraged her to engage in regular exercise appropriate for her age and condition.   Annual physical exam  Impairment of balance  Visual impairment  Numbness of lower limb -     Vitamin B12 -     VAS US  ABI WITH/WO TBI; Future  Chronic constipation  Adult hypothyroidism -     TSH Rfx on Abnormal to Free T4  Hypercholesterolemia without hypertriglyceridemia  Gastroesophageal reflux disease, unspecified whether esophagitis present -     Vitamin B12  High risk medication use -     Vitamin B12  Chronic kidney disease, stage 2, mildly decreased GFR -     Microalbumin / creatinine urine ratio -     Comprehensive metabolic panel with GFR  Family history of peripheral vascular disease -     VAS US  ABI WITH/WO TBI; Future     Annual physical exam Physical exam overall unremarkable except as noted above. Routine lab work ordered as noted. Due for second shingles vaccine dose. Advised to get it at the pharmacy. - Get second dose of shingles vaccine at the pharmacy.  Balance Issues and Fall Risk Balance improved with physical therapy. Insurance limits session frequency. - Continue physical therapy exercises at home. - Consider installing grip tape or a anti-slip mat in the shower. - Consider installing a grab bar in the shower if needed.  Visual Impairment Visual impairment due to previous retinal issue. Reluctant to undergo previous treatments. - Consult with a retinal specialist for further evaluation and recommendations.  Numbness of lower limbs Numbness and tingling in extremities (intermittently both feet and her left hand in particular, which she reports sometimes becomes cold). Previously discontinued gabapentin  due to inability to tolerated and stay  awake during the day.. Discussed vitamin B12 deficiency and peripheral vascular disease concerns. - Order vitamin B12 level. - Order ABI to assess for peripheral vascular disease.  Chronic Constipation Chronic constipation with occasional loose stools. Metamucil dosage adjustment discussed. - Adjust Metamucil dosage to half scoop, reduce further if needed. Consider alternate day dosing.  Hypercholesterolemia without hypertriglyceridemia Cholesterol levels previously good on atorvastatin . - Continue  atorvastatin  as prescribed.  Hypothyroidism Thyroid  levels well-managed on medication. - Check thyroid  levels.  Gastroesophageal reflux disease Chronic, stable.  On omeprazole  daily.  Will check vitamin B12 level due to potential interference with absorption by omeprazole .  Chronic kidney disease stage 2 Noted.  No acute concerns.  Continue to optimize risk factors.    Follow-up Scheduled for visit in August for Medicare annual wellness visit. - Schedule ABI test after cousin's visit, per patient preference. - Attend phone visit in August for Medicare annual wellness visit.  Return in about 6 months (around 05/02/2024) for Chronic f/u.     I discussed the assessment and treatment plan with the patient  The patient was provided an opportunity to ask questions and all were answered. The patient agreed with the plan and demonstrated an understanding of the instructions.   The patient was advised to call back or seek an in-person evaluation if the symptoms worsen or if the condition fails to improve as anticipated.    LAURAINE LOISE BUOY, DO  [facility] [phone] (phone) [phone] (fax)  [facility]

## 2023-11-01 NOTE — Patient Instructions (Addendum)
 Take 1/2 scoop metamucil daily. If causing loose stools, decrease to 1/4 scoop.  Place grip tape, a anti-slip mat, or similar in your shower to help prevent slipping. You may also consider having a grab bar installed.  Please go to your pharmacy for your second shingles vaccine (shingrix)

## 2023-11-02 LAB — COMPREHENSIVE METABOLIC PANEL WITH GFR
ALT: 14 IU/L (ref 0–32)
AST: 26 IU/L (ref 0–40)
Albumin: 4.3 g/dL (ref 3.8–4.8)
Alkaline Phosphatase: 43 IU/L — ABNORMAL LOW (ref 44–121)
BUN/Creatinine Ratio: 17 (ref 12–28)
BUN: 16 mg/dL (ref 8–27)
Bilirubin Total: <0.2 mg/dL (ref 0.0–1.2)
CO2: 22 mmol/L (ref 20–29)
Calcium: 9.2 mg/dL (ref 8.7–10.3)
Chloride: 106 mmol/L (ref 96–106)
Creatinine, Ser: 0.95 mg/dL (ref 0.57–1.00)
Globulin, Total: 2 g/dL (ref 1.5–4.5)
Glucose: 93 mg/dL (ref 70–99)
Potassium: 4.4 mmol/L (ref 3.5–5.2)
Sodium: 141 mmol/L (ref 134–144)
Total Protein: 6.3 g/dL (ref 6.0–8.5)
eGFR: 62 mL/min/{1.73_m2} (ref >59)

## 2023-11-02 LAB — MICROALBUMIN / CREATININE URINE RATIO
Creatinine, Urine: 156.1 mg/dL
Microalb/Creat Ratio: 129 mg/g{creat} — ABNORMAL HIGH (ref 0–29)
Microalbumin, Urine: 202 ug/mL

## 2023-11-02 LAB — T4F: T4,Free (Direct): 0.87 ng/dL (ref 0.82–1.77)

## 2023-11-02 LAB — TSH RFX ON ABNORMAL TO FREE T4: TSH: 12 u[IU]/mL — ABNORMAL HIGH (ref 0.450–4.500)

## 2023-11-02 LAB — VITAMIN B12: Vitamin B-12: 645 pg/mL (ref 232–1245)

## 2023-11-08 ENCOUNTER — Ambulatory Visit: Payer: Self-pay | Admitting: Family Medicine

## 2023-11-08 DIAGNOSIS — E039 Hypothyroidism, unspecified: Secondary | ICD-10-CM

## 2023-11-08 DIAGNOSIS — N182 Chronic kidney disease, stage 2 (mild): Secondary | ICD-10-CM | POA: Insufficient documentation

## 2023-11-16 ENCOUNTER — Ambulatory Visit (INDEPENDENT_AMBULATORY_CARE_PROVIDER_SITE_OTHER)

## 2023-11-16 DIAGNOSIS — R200 Anesthesia of skin: Secondary | ICD-10-CM

## 2023-11-16 DIAGNOSIS — Z8249 Family history of ischemic heart disease and other diseases of the circulatory system: Secondary | ICD-10-CM | POA: Diagnosis not present

## 2023-11-17 ENCOUNTER — Encounter (INDEPENDENT_AMBULATORY_CARE_PROVIDER_SITE_OTHER)

## 2023-11-17 NOTE — Telephone Encounter (Signed)
 Copied from CRM [id number]. Topic: Clinical - Lab/Test Results >> Nov 16, 2023  5:02 PM Sophia H wrote: Reason for CRM: Returning missed call from [place], per chart note from Dr. Donzella Your labs results are back and are overall normal/stable, except:\ - Your thyroid  level (TSH) is elevated again, indicating you are not getting enough medication.  Did you miss any doses of levothyroxine  in the 6 weeks before your appointment?   - If you did miss doses, we will Not change the dose and we will just plan to recheck your level again in 6 to 12 weeks. - If you have not missed any doses, continue the levothyroxine  50 mcg daily unchanged.  Then, for the levothyroxine  25 mcg pill, take 1 pill every other day and 1/2 pill on the alternating days.   Patient states she has not missed doses, advised patient continue the levothyroxine  50 mcg daily unchanged.  Then, for the levothyroxine  25 mcg pill, take 1 pill every other day and 1/2 pill on the alternating days. Patient states if she has any other questions she will call back.

## 2023-11-18 LAB — VAS US ABI WITH/WO TBI
Left ABI: 1.08
Right ABI: 1.07

## 2023-11-26 ENCOUNTER — Telehealth: Payer: Self-pay

## 2023-11-26 ENCOUNTER — Telehealth (INDEPENDENT_AMBULATORY_CARE_PROVIDER_SITE_OTHER): Payer: Self-pay | Admitting: Nurse Practitioner

## 2023-11-26 NOTE — Telephone Encounter (Signed)
 Copied from CRM [id number]. Topic: General - Other >> Nov 25, 2023  4:08 PM Santiya F wrote: Reason for CRM: Patient is calling in because she spoke with someone from the office a few days about her test results. Checked the chart and saw she had spoken with [place]. Patient wants to know if she found out the additional information they spoke about. Please advise.

## 2023-11-26 NOTE — Telephone Encounter (Signed)
 Patient called, had LO in [facility] last Friday. States no one from her referring MD office has reached out to her regarding results. Patient advised to contact Referring office for results.

## 2023-12-29 ENCOUNTER — Ambulatory Visit (INDEPENDENT_AMBULATORY_CARE_PROVIDER_SITE_OTHER)

## 2023-12-29 DIAGNOSIS — Z0000 Encounter for general adult medical examination without abnormal findings: Secondary | ICD-10-CM | POA: Diagnosis not present

## 2023-12-29 NOTE — Patient Instructions (Addendum)
 Hailey Cain , Thank you for taking time out of your busy schedule to complete your Annual Wellness Visit with me. I enjoyed our conversation and look forward to speaking with you again next year. I, as well as your care team,  appreciate your ongoing commitment to your health goals. Please review the following plan we discussed and let me know if I can assist you in the future.   Follow up Visits: 01/03/25 @ 3:10 PM BY PHONE We will see or speak with you next year for your Next Medicare AWV with our clinical staff Have you seen your provider in the last 6 months (3 months if uncontrolled diabetes)? Yes  Clinician Recommendations:  Aim for 30 minutes of exercise or brisk walking, 6-8 glasses of water , and 5 servings of fruits and vegetables each day. TAKE CARE!      This is a list of the screenings recommended for you:  Health Maintenance  Topic Date Due   Flu Shot  12/10/2023   COVID-19 Vaccine (6 - 2024-25 season) 01/10/2024*   Medicare Annual Wellness Visit  12/28/2024   DEXA scan (bone density measurement)  09/06/2025   DTaP/Tdap/Td vaccine (6 - Td or Tdap) 02/29/2028   Pneumococcal Vaccine for age over 76  Completed   Hepatitis C Screening  Completed   Zoster (Shingles) Vaccine  Completed   HPV Vaccine  Aged Out   Meningitis B Vaccine  Aged Out   Colon Cancer Screening  Discontinued  *Topic was postponed. The date shown is not the original due date.    Advanced directives: (ACP Link)Information on Advanced Care Planning can be found at [place]  Secretary of [facility] Advance Health Care Directives Advance Health Care Directives. [email or web address]  Advance Care Planning is important because it:  [x]  Makes sure you receive the medical care that is consistent with your values, goals, and preferences  [x]  It provides guidance to your family and loved ones and reduces their decisional burden about whether or not they are making the right decisions based on your wishes.  Follow the link  provided in your after visit summary or read over the paperwork we have mailed to you to help you started getting your Advance Directives in place. If you need assistance in completing these, please reach out to us  so that we can help you!

## 2023-12-29 NOTE — Progress Notes (Signed)
 Subjective:   Hailey Cain is a 78 y.o. who presents for a Medicare Wellness preventive visit.  As a reminder, Annual Wellness Visits don't include a physical exam, and some assessments may be limited, especially if this visit is performed virtually. We may recommend an in-person follow-up visit with your provider if needed.  Visit Complete: Virtual I connected with  Hailey Cain on 12/29/23 by a audio enabled telemedicine application and verified that I am speaking with the correct person using two identifiers.  Patient Location: Home  Provider Location: Home Office  I discussed the limitations of evaluation and management by telemedicine. The patient expressed understanding and agreed to proceed.  Vital Signs: Because this visit was a virtual/telehealth visit, some criteria may be missing or patient reported. Any vitals not documented were not able to be obtained and vitals that have been documented are patient reported.  VideoDeclined- This patient declined Librarian, academic. Therefore the visit was completed with audio only.  Persons Participating in Visit: Patient.  AWV Questionnaire: No: Patient Medicare AWV questionnaire was not completed prior to this visit.  Cardiac Risk Factors include: advanced age (>21men, >45 women);dyslipidemia     Objective:    There were no vitals filed for this visit. There is no height or weight on file to calculate BMI.     12/29/2023    3:59 PM 10/06/2022    1:40 PM 09/29/2021   10:42 AM 10/10/2019    9:07 AM 05/25/2019    9:22 AM 10/05/2018    9:16 AM 09/28/2017    2:35 PM  Advanced Directives  Does Patient Have a Medical Advance Directive? No No No No No Yes Yes   Type of Careers adviser;Living will Healthcare Power of Attorney  Copy of Healthcare Power of Attorney in Chart?      No - copy requested  No - copy requested   Would patient like information on  creating a medical advance directive? No - Patient declined  No - Patient declined No - Patient declined Yes (Inpatient - patient requests chaplain consult to create a medical advance directive)       Data saved with a previous flowsheet row definition    Current Medications (verified) Outpatient Encounter Medications as of 12/29/2023  Medication Sig   aspirin EC 81 MG tablet Take 81 mg by mouth daily. Swallow whole.   atorvastatin  (LIPITOR) 20 MG tablet Take 1 tablet (20 mg total) by mouth daily.   Calcium  Carbonate (CALCIUM  600 PO) Take 600 mg by mouth 2 (two) times a week.    Cholecalciferol (VITAMIN D-3) 25 MCG (1000 UT) CAPS Take 1,000 Units by mouth daily.   FLUoxetine  (PROZAC ) 20 MG capsule TAKE 2 CAPSULES EVERY DAY   levothyroxine  (SYNTHROID ) 25 MCG tablet Take 0.5 tablets (12.5 mcg total) by mouth daily before breakfast.   levothyroxine  (SYNTHROID ) 50 MCG tablet Take 1 tablet (50 mcg total) by mouth daily before breakfast.   Multiple Vitamins-Minerals (EMERGEN-C IMMUNE PO) Take 1 tablet by mouth daily as needed (immunity boost).    omeprazole  (PRILOSEC) 20 MG capsule Take 1 capsule (20 mg total) by mouth daily.   vitamin B-12 (CYANOCOBALAMIN ) 500 MCG tablet Take 500 mcg by mouth daily.   gabapentin  (NEURONTIN ) 100 MG capsule Take 1 capsule (100 mg total) by mouth daily. (Patient not taking: Reported on 12/29/2023)   No facility-administered encounter medications on file as of 12/29/2023.  Allergies (verified) Patient has no known allergies.   History: Past Medical History:  Diagnosis Date   Acid reflux 12/30/2006   Actinic keratosis    Adult hypothyroidism 01/13/2007   Allergic rhinitis 09/07/2014   Anxiety 12/10/2014   Basal cell carcinoma 2013   left nose, > 10 years ago   BCC (basal cell carcinoma), arm, right 12/14/2022   Right medial arm. Nodular pattern. Excised 01/20/2023   Bronchitis 05/17/2015   Cancer of cheek (HCC) 07/04/2007   Depression, neurotic 12/30/2006    Dysplastic nevus 12/16/2021   left lower chest moderate atypia   Hyperlipidemia    Prediabetes 09/07/2014   RAD (reactive airway disease) 09/07/2014   Small bowel obstruction (HCC) 01/31/2017   Past Surgical History:  Procedure Laterality Date   ABDOMINAL HYSTERECTOMY  1988   heavy, irregular menses; ovaries intact   AUGMENTATION MAMMAPLASTY Bilateral    silicone   BREAST SURGERY  1970   augmentation   CATARACT EXTRACTION Bilateral 2013   Dr. Lavonia.  [place].  [facility].   EYE SURGERY     cataract extraction   EYE SURGERY  2007   LASIK   MEMBRANE PEEL Left 05/25/2019   Procedure: MEMBRANE PEEL;  Surgeon: Valdemar Rogue, MD;  Location: [facility] OR;  Service: Ophthalmology;  Laterality: Left;   PARS PLANA VITRECTOMY Left 05/25/2019   Procedure: PARS PLANA VITRECTOMY WITH 25 GAUGE;  Surgeon: Valdemar Rogue, MD;  Location[facility] OR;  Service: Ophthalmology;  Laterality: Left;   TONSILLECTOMY AND ADENOIDECTOMY  1950   TUBAL LIGATION     YAG LASER APPLICATION Left 03/24/2019   Dr. Rogue Valdemar   Family History  Problem Relation Age of Onset   Stroke Mother    CAD Mother    Heart attack Father    Hyperthyroidism Sister    Heart Problems Brother    Cancer Brother        skin   CAD Brother        CABG   Social History   Socioeconomic History   Marital status: Widowed    Spouse name: Lynwood Charleston   Number of children: 1   Years of education: H/S   Highest education level: 12th grade  Occupational History   Occupation: retired  Tobacco Use   Smoking status: Former    Current packs/day: 0.00    Average packs/day: 1 pack/day for 20.0 years (20.0 ttl pk-yrs)    Types: Cigarettes    Start date: 05/12/1983    Quit date: 05/12/2003    Years since quitting: 20.6   Smokeless tobacco: Never  Vaping Use   Vaping status: Never Used  Substance and Sexual Activity   Alcohol use: No    Alcohol/week: 0.0 standard drinks of alcohol   Drug use: No   Sexual activity: Not on file  Other Topics  Concern   Not on file  Social History Narrative   Not on file   Social Drivers of Health   Financial Resource Strain: Low Risk  (12/29/2023)   Overall Financial Resource Strain (CARDIA)    Difficulty of Paying Living Expenses: Not hard at all  Food Insecurity: No Food Insecurity (12/29/2023)   Hunger Vital Sign    Worried About Running Out of Food in the Last Year: Never true    Ran Out of Food in the Last Year: Never true  Transportation Needs: No Transportation Needs (12/29/2023)   PRAPARE - Administrator, Civil Service (Medical): No    Lack of Transportation (  Non-Medical): No  Physical Activity: Sufficiently Active (12/29/2023)   Exercise Vital Sign    Days of Exercise per Week: 7 days    Minutes of Exercise per Session: 40 min  Stress: No Stress Concern Present (12/29/2023)   [name]    Feeling of Stress: Not at all  Social Connections: Moderately Isolated (12/29/2023)   Social Connection and Isolation Panel    Frequency of Communication with Friends and Family: More than three times a week    Frequency of Social Gatherings with Friends and Family: More than three times a week    Attends Religious Services: More than 4 times per year    Active Member of [employer] or Organizations: No    Attends Banker Meetings: Never    Marital Status: Widowed    Tobacco Counseling Counseling given: Not Answered    Clinical Intake:  Pre-visit preparation completed: Yes  Pain : No/denies pain     BMI - recorded: 20.2 Nutritional Status: BMI of 19-24  Normal Nutritional Risks: None Diabetes: No  Lab Results  Component Value Date   HGBA1C 5.6 07/09/2023   HGBA1C 5.5 05/23/2019   HGBA1C 5.6 10/05/2018     How often do you need to have someone help you when you read instructions, pamphlets, or other written materials from your doctor or pharmacy?: 1 - Never  Interpreter Needed?:  No  Information entered by :: JHONNIE DAS, LPN   Activities of Daily Living     12/29/2023    4:03 PM  In your present state of health, do you have any difficulty performing the following activities:  Hearing? 1  Vision? 1  Difficulty concentrating or making decisions? 1  Walking or climbing stairs? 0  Dressing or bathing? 0  Doing errands, shopping? 0  Preparing Food and eating ? N  Using the Toilet? N  In the past six months, have you accidently leaked urine? N  Do you have problems with loss of bowel control? N  Managing your Medications? N  Managing your Finances? N  Housekeeping or managing your Housekeeping? N    Patient Care Team: Pardue, Lauraine SAILOR, DO as PCP - General (Family Medicine) [facility], [facility] (Optometry)  I have updated your Care Teams any recent Medical Services you may have received from other providers in the past year.     Assessment:   This is a routine wellness examination for [place].  Hearing/Vision screen Hearing Screening - Comments:: WEARS AIDS, BOTH EARS Vision Screening - Comments:: READERS- [facility]   Goals Addressed             This Visit's Progress    DIET - INCREASE WATER  INTAKE         Depression Screen     12/29/2023    3:57 PM 11/01/2023    9:02 AM 07/09/2023    9:25 AM 05/07/2023    9:18 AM 10/06/2022    1:38 PM 05/15/2022    9:21 AM 03/12/2022   11:33 AM  PHQ 2/9 Scores  PHQ - 2 Score 0 0 0 0 0 0 1  PHQ- 9 Score 0 0 0 2  0     Fall Risk     12/29/2023    4:02 PM 11/01/2023    9:07 AM 07/09/2023    9:25 AM 05/07/2023    9:18 AM 10/06/2022    1:34 PM  Fall Risk   Falls in the past year?  1 1 1 1 1   Number falls in past yr: 1 0 1 1 1   Injury with Fall? 0 1 0 1 0  Risk for fall due to : History of fall(s)   Impaired balance/gait History of fall(s);Impaired balance/gait  Follow up Falls evaluation completed;Falls prevention discussed   Falls evaluation completed Falls prevention discussed;Education provided     MEDICARE RISK AT HOME:  Medicare Risk at Home Any stairs in or around the home?: Yes If so, are there any without handrails?: No Home free of loose throw rugs in walkways, pet beds, electrical cords, etc?: Yes Adequate lighting in your home to reduce risk of falls?: Yes Life alert?: No Use of a cane, walker or w/c?: No Grab bars in the bathroom?: No Shower chair or bench in shower?: No Elevated toilet seat or a handicapped toilet?: No  TIMED UP AND GO:  Was the test performed?  No  Cognitive Function: 6CIT completed    07/09/2023    9:39 AM 03/12/2022   11:27 AM  MMSE - Mini Mental State Exam  Orientation to time 5 5  Orientation to Place 5 5  Registration 3 3  Attention/ Calculation 5 4  Recall 3 2  Language- name 2 objects 2 2  Language- repeat 1 0  Language- follow 3 step command 3 3  Language- read & follow direction 1 0  Write a sentence 1 1  Copy design 1 1  Total score 30 26        12/29/2023    4:05 PM 10/06/2022    1:46 PM 09/29/2021   10:47 AM 10/10/2019    9:11 AM 08/11/2016    9:21 AM  6CIT Screen  What Year? 0 points 0 points 0 points 0 points 0 points  What month? 0 points 0 points 0 points 0 points 0 points  What time? 0 points 0 points 0 points 0 points 0 points  Count back from 20 0 points 0 points 0 points 0 points 0 points  Months in reverse 0 points 0 points 2 points 2 points 0 points  Repeat phrase 0 points 0 points 0 points 0 points 4 points  Total Score 0 points 0 points 2 points 2 points 4 points    Immunizations Immunization History  Administered Date(s) Administered   Fluad Quad(high Dose 65+) 01/28/2022   Influenza, High Dose Seasonal PF 02/13/2015, 02/08/2017, 02/04/2018, 01/28/2023   Influenza-Unspecified 02/06/2019, 02/03/2021, 01/28/2022   Moderna Covid-19 Fall Seasonal Vaccine 83yrs & older 03/07/2022   PFIZER Comirnaty(Gray Top)Covid-19 Tri-Sucrose Vaccine 10/21/2020   PFIZER(Purple Top)SARS-COV-2 Vaccination 07/07/2019,  08/04/2019, 02/16/2020   PNEUMOCOCCAL CONJUGATE-20 01/28/2023   Pfizer(Comirnaty)Fall Seasonal Vaccine 12 years and older 03/07/2022   Pneumococcal Conjugate-13 02/13/2015   Pneumococcal Polysaccharide-23 05/01/2017   Respiratory Syncytial Virus Vaccine,Recomb Aduvanted(Arexvy) 01/28/2022   Td 08/21/2002, 10/16/2010, 02/28/2018   Tdap 10/16/2010, 02/28/2018   Zoster Recombinant(Shingrix) 10/21/2020, 11/02/2021   Zoster, Live 10/27/2014    Screening Tests Health Maintenance  Topic Date Due   INFLUENZA VACCINE  12/10/2023   COVID-19 Vaccine (6 - 2024-25 season) 01/10/2024 (Originally 01/10/2023)   Medicare Annual Wellness (AWV)  12/28/2024   DEXA SCAN  09/06/2025   DTaP/Tdap/Td (6 - Td or Tdap) 02/29/2028   Pneumococcal Vaccine: 50+ Years  Completed   Hepatitis C Screening  Completed   Zoster Vaccines- Shingrix  Completed   HPV VACCINES  Aged Out   Meningococcal B Vaccine  Aged Out   Colonoscopy  Discontinued    Health  Maintenance  Health Maintenance Due  Topic Date Due   INFLUENZA VACCINE  12/10/2023   Health Maintenance Items Addressed: UP TO DATE ON SHOTS EXCEPT COVID; UP TO DATE ON BDS; AGED OUT OF MAMMOGRAM & COLONOSCOPY   Additional Screening:  Vision Screening: Recommended annual ophthalmology exams for early detection of glaucoma and other disorders of the eye. Would you like a referral to an eye doctor? No    Dental Screening: Recommended annual dental exams for proper oral hygiene  Community Resource Referral / Chronic Care Management: CRR required this visit?  No   CCM required this visit?  No   Plan:    I have personally reviewed and noted the following in the patient's chart:   Medical and social history Use of alcohol, tobacco or illicit drugs  Current medications and supplements including opioid prescriptions. Patient is not currently taking opioid prescriptions. Functional ability and status Nutritional status Physical activity Advanced  directives List of other physicians Hospitalizations, surgeries, and ER visits in previous 12 months Vitals Screenings to include cognitive, depression, and falls Referrals and appointments  In addition, I have reviewed and discussed with patient certain preventive protocols, quality metrics, and best practice recommendations. A written personalized care plan for preventive services as well as general preventive health recommendations were provided to patient.   Jhonnie GORMAN Das, LPN   1/79/7974   After Visit Summary: (MyChart) Due to this being a telephonic visit, the after visit summary with patients personalized plan was offered to patient via MyChart   Notes: Nothing significant to report at this time.

## 2024-02-08 DIAGNOSIS — E039 Hypothyroidism, unspecified: Secondary | ICD-10-CM | POA: Diagnosis not present

## 2024-02-09 LAB — TSH: TSH: 9.24 u[IU]/mL — ABNORMAL HIGH (ref 0.450–4.500)

## 2024-02-14 MED ORDER — LEVOTHYROXINE SODIUM 75 MCG PO TABS: 75.0000 ug | ORAL_TABLET | Freq: Every day | ORAL | 0 refills | Status: DC

## 2024-02-14 NOTE — Addendum Note (Signed)
 Addended by: DONZELLA DOMINO on: 02/14/2024 05:39 PM   Modules accepted: Orders

## 2024-02-22 ENCOUNTER — Telehealth: Payer: Self-pay

## 2024-02-22 NOTE — Telephone Encounter (Signed)
 Copied from CRM [id number]. Topic: Clinical - Prescription Issue >> Feb 22, 2024  1:29 PM Hailey Cain wrote: Reason for CRM: Patient states meds were sent to wrong pharmacy. levothyroxine  (SYNTHROID ) 75 MCG tablet.  **Patient also states she had been taking   of Levothyroxine  and full . Wondering if something has changed in her treatment plan. Please advise # [phone]   [facility] [address] [place] - [address]

## 2024-02-26 DIAGNOSIS — F3342 Major depressive disorder, recurrent, in full remission: Secondary | ICD-10-CM | POA: Diagnosis not present

## 2024-03-02 ENCOUNTER — Other Ambulatory Visit: Payer: Self-pay

## 2024-03-02 DIAGNOSIS — E039 Hypothyroidism, unspecified: Secondary | ICD-10-CM

## 2024-03-02 MED ORDER — LEVOTHYROXINE SODIUM 75 MCG PO TABS: 75.0000 ug | ORAL_TABLET | Freq: Every day | ORAL | 0 refills | Status: AC

## 2024-03-02 NOTE — Telephone Encounter (Signed)
 Advised

## 2024-03-07 ENCOUNTER — Other Ambulatory Visit: Payer: Self-pay | Admitting: Family Medicine

## 2024-03-07 DIAGNOSIS — E039 Hypothyroidism, unspecified: Secondary | ICD-10-CM

## 2024-03-08 ENCOUNTER — Other Ambulatory Visit: Payer: Self-pay | Admitting: Family Medicine

## 2024-03-08 DIAGNOSIS — E039 Hypothyroidism, unspecified: Secondary | ICD-10-CM

## 2024-04-26 ENCOUNTER — Ambulatory Visit

## 2024-04-26 ENCOUNTER — Ambulatory Visit (INDEPENDENT_AMBULATORY_CARE_PROVIDER_SITE_OTHER): Admitting: Internal Medicine

## 2024-04-26 ENCOUNTER — Encounter: Payer: Self-pay | Admitting: Internal Medicine

## 2024-04-26 ENCOUNTER — Ambulatory Visit: Payer: Self-pay

## 2024-04-26 VITALS — BP 126/74 | HR 89 | Ht 61.0 in | Wt 109.6 lb

## 2024-04-26 DIAGNOSIS — J441 Chronic obstructive pulmonary disease with (acute) exacerbation: Secondary | ICD-10-CM | POA: Insufficient documentation

## 2024-04-26 DIAGNOSIS — J45909 Unspecified asthma, uncomplicated: Secondary | ICD-10-CM | POA: Diagnosis not present

## 2024-04-26 MED ORDER — DOXYCYCLINE HYCLATE 100 MG PO TABS: 100.0000 mg | ORAL_TABLET | Freq: Two times a day (BID) | ORAL | 0 refills | Status: AC

## 2024-04-26 MED ORDER — PREDNISONE 10 MG PO TABS: ORAL_TABLET | ORAL | 0 refills | Status: DC

## 2024-04-26 MED ORDER — GUAIFENESIN-CODEINE 100-10 MG/5ML PO SOLN: 10.0000 mL | Freq: Three times a day (TID) | ORAL | 0 refills | Status: AC | PRN

## 2024-04-26 NOTE — Assessment & Plan Note (Addendum)
 She is not wheezing on exam but has decreased BS on the right posteriorly.  Ambulatory sats do drop to 93%.  Empiric treatement  with doxycycline  , prednisone ,  albuterol MDI  and codeine  containing cough suppressant  chest x ray ordered. To rule out CAP

## 2024-04-26 NOTE — Telephone Encounter (Signed)
 No appointments available at patient's PCP office--Patient scheduled at 1pm today 04/26/2024 with Dr Marylynn at [facility] Only or Action Required?: FYI only for provider: appointment scheduled on 04/26/2024 at .  Patient was last seen in primary care on 11/01/2023 by Donzella Lauraine SAILOR, DO.  Called Nurse Triage reporting Cough.  Symptoms began 2 weeks.  Interventions attempted: Rest, hydration, or home remedies.  Symptoms are: gradually worsening.  Triage Disposition: See HCP Within 4 Hours (Or PCP Triage)  Patient/caregiver understands and will follow disposition?: Yes                Copied from CRM #[id number]. Topic: Clinical - Red Word Triage >> Apr 26, 2024  8:00 AM Willma R wrote: [facility] that prompted transfer to Nurse Triage: Patient thinks she has bronchitis, has chest congestion, cough - on starts has trouble breathing, cant catch her breath until the next round of coughing happens, trouble sleeping. Reason for Disposition  [1] MILD difficulty breathing (e.g., minimal/no SOB at rest, SOB with walking, pulse < 100) AND [2] still present when not coughing  Answer Assessment - Initial Assessment Questions No appointments available at patient's PCP office--Patient scheduled at 1pm today 04/26/2024 with Dr Marylynn at [facility]  Patient is advised to call us  back if anything changes or with any further questions/concerns. Patient is advised that if anything worsens to go to the Emergency Room. Patient verbalized understanding.    1. ONSET: When did the cough begin?      2 weeks ago 2. SEVERITY: How bad is the cough today?      Still continuous with some bouts of shortness of breath with coughing fits 3. SPUTUM: Describe the color of your sputum (e.g., none, dry cough; clear, white, yellow, green)     Clear thick 4. HEMOPTYSIS: Are you coughing up any blood? If Yes, ask: How much? (e.g., flecks, streaks, tablespoons, etc.)      denies 5. DIFFICULTY BREATHING: Are you having difficulty breathing? If Yes, ask: How bad is it? (e.g., mild, moderate, severe)      denies 6. FEVER: Do you have a fever? If Yes, ask: What is your temperature, how was it measured, and when did it start?     denies 7. CARDIAC HISTORY: Do you have any history of heart disease? (e.g., heart attack, congestive heart failure)      denies 8. LUNG HISTORY: Do you have any history of lung disease?  (e.g., pulmonary embolus, asthma, emphysema)     History of bronchitis in the past 9. PE RISK FACTORS: Do you have a history of blood clots? (or: recent major surgery, recent prolonged travel, bedridden)     denies 10. OTHER SYMPTOMS: Do you have any other symptoms? (e.g., runny nose, wheezing, chest pain)       Not sleeping due to cough  Protocols used: Cough - Acute Productive-A-AH

## 2024-04-26 NOTE — Patient Instructions (Addendum)
 I am prescribing you a prednisone  taper, a 7 day course of doxycycline ,  an  albuterol inhaler if needed for wheezing/chest tightness (use every 6 hours if needed) and a cough medicine with CODEINE  FOR THE COUGH .  THE COUGH MEDICATION CONTAINS THE GUAIFENESIN   that you are currently taking as a mucus relief medicine  If your chest x ray suggests that you have pneumonia,  you will need a 2nd antibiotic added to the doxycycline  and I will notify you.   Please take a probiotic ( Align, Floraque or Culturelle)the generic version of one of these, or eat a serving of yougurt daily   For a minimum of 3 weeks to prevent a serious antibiotic associated diarrhea  Called clostridium dificile colitis

## 2024-04-26 NOTE — Progress Notes (Unsigned)
 Subjective:  Patient ID: Hailey Cain, female    DOB: 1945/12/08  Age: 78 y.o. MRN: [id number]  CC: The primary encounter diagnosis was COPD with acute exacerbation (HCC). A diagnosis of Reactive airway disease without complication, unspecified asthma severity, unspecified whether persistent was also pertinent to this visit.   HPI Hailey Cain presents for  Chief Complaint  Patient presents with   Cough    Productive cough x 2 weeks   Hailey Cain is a 78 yr old female with reactive airway disease  ( no prior PFTs ) and history of tobacco abuse  (remote ,  quit 20 yrs ago) who presents with 2 week history of productive cough that started after contact with a 72 yr old grandson who had a cough .  The cough is getting worse ,  and producing shortness of breath during paroxysmal episodes .  She  does note shortness of breath with walking but not at rest . .  Taking guaifenesin   and robitussin ,.  Feels it has settled in chest . No sinus or ear pain.  No fevers or body aches.  Cough is waking her up at  night.   Outpatient Medications Prior to Visit  Medication Sig Dispense Refill   aspirin EC 81 MG tablet Take 81 mg by mouth daily. Swallow whole.     atorvastatin  (LIPITOR) 20 MG tablet Take 1 tablet (20 mg total) by mouth daily. 90 tablet 3   Calcium  Carbonate (CALCIUM  600 PO) Take 600 mg by mouth 2 (two) times a week.      Cholecalciferol (VITAMIN D-3) 25 MCG (1000 UT) CAPS Take 1,000 Units by mouth daily.     FLUoxetine  (PROZAC ) 20 MG capsule TAKE 2 CAPSULES EVERY DAY 180 capsule 3   levothyroxine  (SYNTHROID ) 75 MCG tablet Take 1 tablet (75 mcg total) by mouth daily. 90 tablet 0   Multiple Vitamins-Minerals (EMERGEN-C IMMUNE PO) Take 1 tablet by mouth daily as needed (immunity boost).      omeprazole  (PRILOSEC) 20 MG capsule Take 1 capsule (20 mg total) by mouth daily. 90 capsule 3   vitamin B-12 (CYANOCOBALAMIN ) 500 MCG tablet Take 500 mcg by mouth daily.     gabapentin   (NEURONTIN ) 100 MG capsule Take 1 capsule (100 mg total) by mouth daily. (Patient not taking: Reported on 04/26/2024) 30 capsule 0   No facility-administered medications prior to visit.    Review of Systems;  Patient denies headache, fevers, malaise, unintentional weight loss, skin rash, eye pain, sinus congestion and sinus pain, sore throat, dysphagia,  hemoptysis ,chest pain, palpitations, orthopnea, edema, abdominal pain, nausea, melena, diarrhea, constipation, flank pain, dysuria, hematuria, urinary  Frequency, nocturia, numbness, tingling, seizures,  Focal weakness, Loss of consciousness,  Tremor, insomnia, depression, anxiety, and suicidal ideation.      Objective:  BP 126/74   Pulse 89   Ht 5' 1 (1.549 m)   Wt 109 lb 9.6 oz (49.7 kg)   SpO2 93%   BMI 20.71 kg/m   BP Readings from Last 3 Encounters:  04/26/24 126/74  11/01/23 128/66  07/09/23 (!) 125/55    Wt Readings from Last 3 Encounters:  04/26/24 109 lb 9.6 oz (49.7 kg)  11/01/23 107 lb 6.4 oz (48.7 kg)  07/09/23 106 lb 4.8 oz (48.2 kg)    Physical Exam Vitals reviewed.  Constitutional:      General: She is not in acute distress.    Appearance: Normal appearance. She is normal weight. She is not ill-appearing,  toxic-appearing or diaphoretic.  HENT:     Head: Normocephalic.  Eyes:     General: No scleral icterus.       Right eye: No discharge.        Left eye: No discharge.     Conjunctiva/sclera: Conjunctivae normal.  Cardiovascular:     Rate and Rhythm: Normal rate and regular rhythm.     Heart sounds: Normal heart sounds.  Pulmonary:     Effort: Pulmonary effort is normal. No respiratory distress.     Breath sounds: Decreased air movement present. Examination of the right-lower field reveals decreased breath sounds. Decreased breath sounds present.    Musculoskeletal:        General: Normal range of motion.  Skin:    General: Skin is warm and dry.  Neurological:     General: No focal deficit  present.     Mental Status: She is alert and oriented to person, place, and time. Mental status is at baseline.  Psychiatric:        Mood and Affect: Mood normal.        Behavior: Behavior normal.        Thought Content: Thought content normal.        Judgment: Judgment normal.     Lab Results  Component Value Date   HGBA1C 5.6 07/09/2023   HGBA1C 5.5 05/23/2019   HGBA1C 5.6 10/05/2018    Lab Results  Component Value Date   CREATININE 0.95 11/01/2023   CREATININE 0.75 07/09/2023   CREATININE 0.77 10/29/2022    Lab Results  Component Value Date   WBC 4.7 10/29/2022   HGB 12.8 10/29/2022   HCT 39.8 10/29/2022   PLT 247 10/29/2022   GLUCOSE 93 11/01/2023   CHOL 168 07/09/2023   TRIG 112 07/09/2023   HDL 74 07/09/2023   LDLCALC 74 07/09/2023   ALT 14 11/01/2023   AST 26 11/01/2023   NA 141 11/01/2023   K 4.4 11/01/2023   CL 106 11/01/2023   CREATININE 0.95 11/01/2023   BUN 16 11/01/2023   CO2 22 11/01/2023   TSH 9.240 (H) 02/08/2024   HGBA1C 5.6 07/09/2023    DG Bone Density Result Date: 09/07/2023 EXAM: DUAL X-RAY ABSORPTIOMETRY (DXA) FOR BONE MINERAL DENSITY 09/07/2023 11:07 am CLINICAL DATA:  78 year old Female Postmenopausal. Screening for osteoporosis Patient is or has been on bone building therapies. TECHNIQUE: An axial (e.g., hips, spine) and/or appendicular (e.g., radius) exam was performed, as appropriate, using GE Secretary/administrator at [facility]. Images are obtained for bone mineral density measurement and are not obtained for diagnostic purposes. [id number] Exclusions: L3 due to T-score deviation >1.0 COMPARISON:  06/26/2021 FINDINGS: Scan quality: Good. LUMBAR SPINE (L1-L2, L4): BMD (in g/cm2): 0.995 T-score: -1.5 Z-score: 0.3 Rate of change from previous exam: 3.6% LEFT FEMORAL NECK: BMD (in g/cm2): 0.716 T-score: -2.3 Z-score: -0.3 LEFT TOTAL HIP: BMD (in g/cm2): 0.741 T-score: -2.1 Z-score: -0.2 RIGHT FEMORAL NECK: BMD (in g/cm2):  0.719 T-score: -2.3 Z-score: -0.3 RIGHT TOTAL HIP: BMD (in g/cm2): 0.685 T-score: -2.6 Z-score: -0.7 DUAL-FEMUR TOTAL MEAN: Rate of change from previous exam: No significant rate of change from previous exam. LEFT FOREARM (RADIUS 33%): BMD (in g/cm2): 0.798 T-score: -0.9 Z-score: 1.6 Rate of change from previous exam: 9.2% FRAX 10-YEAR PROBABILITY OF FRACTURE: FRAX not reported as the lowest BMD is not in the osteopenia range. IMPRESSION: Osteoporosis based on BMD. Fracture risk is unknown due to history of bone building therapy. RECOMMENDATIONS:  1. All patients should optimize calcium  and vitamin D intake. 2. Consider FDA-approved medical therapies in postmenopausal women and men aged 49 years and older, based on the following: - A hip or vertebral (clinical or morphometric) fracture - T-score less than or equal to -2.5 and secondary causes have been excluded. - Low bone mass (T-score between -1.0 and -2.5) and a 10-year probability of a hip fracture greater than or equal to 3% or a 10-year probability of a major osteoporosis-related fracture greater than or equal to 20% based on the [place] -adapted WHO algorithm. - Clinician judgment and/or patient preferences may indicate treatment for people with 10-year fracture probabilities above or below these levels 3. Patients with diagnosis of osteoporosis or at high risk for fracture should have regular bone mineral density tests. For patients eligible for Medicare, routine testing is allowed once every 2 years. The testing frequency can be increased to one year for patients who have rapidly progressing disease, those who are receiving or discontinuing medical therapy to restore bone mass, or have additional risk factors. Electronically Signed   By: Reyes Phi M.D.   On: 09/07/2023 13:31    Assessment & Plan:  .COPD with acute exacerbation (HCC) Assessment & Plan: She is not wheezing on exam but has decreased BS on the right posteriorly.  Ambulatory sats do drop to  93%.  Empiric treatement  with doxycycline  , prednisone ,  albuterol MDI  and codeine  containing cough suppressant  chest x ray as been done but not read at time of note closure.  By my read she may have an infiltrate at the right base.  Adding second abx   Orders: -     DG Chest 2 View; Future  Reactive airway disease without complication, unspecified asthma severity, unspecified whether persistent Assessment & Plan: No prior workup, with hyperinflated lungs noted on prior imaging.  Advised patient to discuss referral for PFTs with primary provider.    Other orders -     predniSONE ; 6 tablets on Day 1 , then reduce by 1 tablet daily until gone  Dispense: 21 tablet; Refill: 0 -     Doxycycline  Hyclate; Take 1 tablet (100 mg total) by mouth 2 (two) times daily.  Dispense: 14 tablet; Refill: 0 -     guaiFENesin -Codeine ; Take 10 mLs by mouth 3 (three) times daily as needed for cough.  Dispense: 120 mL; Refill: 0 -     Albuterol Sulfate HFA; Inhale 2 puffs into the lungs every 6 (six) hours as needed for wheezing or shortness of breath.  Dispense: 3.7 g; Refill: 11 -     Azithromycin ; Take 1 tablet (500 mg total) by mouth daily.  Dispense: 7 tablet; Refill: 0  I personally spent a total of 30 minutes in the care of the patient today including preparing to see the patient, getting/reviewing separately obtained history, performing a medically appropriate exam/evaluation, placing orders, documenting clinical information in the EHR, independently interpreting results, and communicating results.   Follow-up: No follow-ups on file.   Verneita LITTIE Kettering, MD

## 2024-04-27 MED ORDER — AZITHROMYCIN 500 MG PO TABS: 500.0000 mg | ORAL_TABLET | Freq: Every day | ORAL | 0 refills | Status: DC

## 2024-04-27 NOTE — Telephone Encounter (Signed)
 I have reviewed your chest x ray (which has not been read by the radiologist yet)  and am concerned that you have an infiltrate at the right base so I am treating your for community acquired pneumonia with 2 antibiotics AT THE SAME TIME    .  I am adding azithromycin  to the medications I prescribed at your visit yesterday so you will be taking :   Azithromycin  :  ONE TABLET daily with food for 7 days  Doxycycline    1 tablet twice daily with food for 7 days  Cheratussin cough syrup I also advise use of the following OTC meds to help with your other symptoms.     Please remember  take a probiotic and follow up with your regular physician in 2 weeks

## 2024-04-27 NOTE — Telephone Encounter (Signed)
 Spoke with pt and informed her of message below and additional medication she needs to take with her others. Pt gave a verbal understanding and asked that we send message through her mychart.

## 2024-04-27 NOTE — Assessment & Plan Note (Signed)
 No prior workup, with hyperinflated lungs noted on prior imaging.  Advised patient to discuss referral for PFTs with primary provider.

## 2024-05-02 ENCOUNTER — Ambulatory Visit: Admitting: Family Medicine

## 2024-05-02 ENCOUNTER — Encounter: Payer: Self-pay | Admitting: Family Medicine

## 2024-05-02 VITALS — BP 154/75 | HR 64 | Temp 98.2°F | Ht 61.0 in | Wt 114.5 lb

## 2024-05-02 DIAGNOSIS — E7800 Pure hypercholesterolemia, unspecified: Secondary | ICD-10-CM

## 2024-05-02 DIAGNOSIS — E039 Hypothyroidism, unspecified: Secondary | ICD-10-CM | POA: Diagnosis not present

## 2024-05-02 DIAGNOSIS — N182 Chronic kidney disease, stage 2 (mild): Secondary | ICD-10-CM | POA: Diagnosis not present

## 2024-05-02 DIAGNOSIS — R030 Elevated blood-pressure reading, without diagnosis of hypertension: Secondary | ICD-10-CM

## 2024-05-02 DIAGNOSIS — J189 Pneumonia, unspecified organism: Secondary | ICD-10-CM

## 2024-05-02 DIAGNOSIS — R7303 Prediabetes: Secondary | ICD-10-CM

## 2024-05-02 NOTE — Progress Notes (Signed)
 "     Established patient visit   Patient: Hailey Cain   DOB: 1946/04/04   78 y.o. Female  MRN: [id number] Visit Date: 05/02/2024  Today's healthcare provider: LAURAINE LOISE BUOY, DO   Chief Complaint  Patient presents with   Medical Management of Chronic Issues    Patient is here for a 6 month follow up, reports that she was diagnosed with pneumonia and wants some advice.   Subjective    HPI Hailey Cain is a 78 year old female who presents with questions about her recent pneumonia diagnosis.  She reports that a chest X-ray showed a spot on her lung and that she was told she had pneumonia. She was prescribed azithromycin , doxycycline , and prednisone . She has completed the azithromycin  and prednisone  courses and is on her last dose of doxycycline . Her symptoms have improved, with a decrease in cough, and she notes that the cough medicine has helped her rest better as she was experiencing sleep disturbances prior. She denies any chest pain.  She mentions an increase in her levothyroxine  dose back in September and has been adjusting her dosage. She was initially taking two doses but has now reduced it to 75 mcg. She notes that her blood pressure is elevated today, which she attributes to anxiety, although she has never had blood pressure issues before.  She experiences numbness in her legs at night while resting or sleeping, but denies any swelling or discomfort in her legs. She also mentions a one-time incident of neck and shoulder pain, which she attributes to possibly sleeping wrong, and has been using a heat pad for relief. No fever, chills, nausea, vomiting, or abdominal pain.  She has used an albuterol inhaler given to her recently but does not feel she needs it now. She denies any regular breathing issues and only used the inhaler during her recent illness.      Medications: Show/hide medication list[1]       Objective    BP (!) 154/75 (BP Location: Right  Arm, Cuff Size: Normal)   Pulse 64   Temp 98.2 F (36.8 C) (Oral)   Ht 5' 1 (1.549 m)   Wt 114 lb 8 oz (51.9 kg)   SpO2 98%   BMI 21.63 kg/m     Physical Exam Constitutional:      Appearance: Normal appearance.  HENT:     Head: Normocephalic and atraumatic.  Eyes:     General: No scleral icterus.    Extraocular Movements: Extraocular movements intact.     Conjunctiva/sclera: Conjunctivae normal.  Cardiovascular:     Rate and Rhythm: Normal rate and regular rhythm.     Pulses: Normal pulses.     Heart sounds: Normal heart sounds.  Pulmonary:     Effort: Pulmonary effort is normal. No respiratory distress.     Breath sounds: Normal breath sounds.  Abdominal:     General: Bowel sounds are normal. There is no distension.     Palpations: Abdomen is soft. There is no mass.     Tenderness: There is no abdominal tenderness. There is no guarding.  Musculoskeletal:     Right lower leg: No edema.     Left lower leg: No edema.  Skin:    General: Skin is warm and dry.  Neurological:     Mental Status: She is alert and oriented to person, place, and time. Mental status is at baseline.  Psychiatric:        Mood and Affect:  Mood normal.        Behavior: Behavior normal.      Results for orders placed or performed in visit on 05/02/24  Hemoglobin A1c  Result Value Ref Range   Hgb A1c MFr Bld 5.9 (H) 4.8 - 5.6 %   Est. average glucose Bld gHb Est-mCnc 123 mg/dL  Lipid panel  Result Value Ref Range   Cholesterol, Total 161 100 - 199 mg/dL   Triglycerides 79 0 - 149 mg/dL   HDL 68 >60 mg/dL   VLDL Cholesterol Cal 15 5 - 40 mg/dL   LDL Chol Calc (NIH) 78 0 - 99 mg/dL   Chol/HDL Ratio 2.4 0.0 - 4.4 ratio  TSH Rfx on Abnormal to Free T4  Result Value Ref Range   TSH 1.230 0.450 - 4.500 uIU/mL  Comprehensive metabolic panel with GFR  Result Value Ref Range   Glucose 96 70 - 99 mg/dL   BUN 16 8 - 27 mg/dL   Creatinine, Ser 9.43 (L) 0.57 - 1.00 mg/dL   eGFR 93 >40 fO/fpw/8.26    BUN/Creatinine Ratio 29 (H) 12 - 28   Sodium 141 134 - 144 mmol/L   Potassium 3.8 3.5 - 5.2 mmol/L   Chloride 105 96 - 106 mmol/L   CO2 25 20 - 29 mmol/L   Calcium  9.1 8.7 - 10.3 mg/dL   Total Protein 5.9 (L) 6.0 - 8.5 g/dL   Albumin 3.8 3.8 - 4.8 g/dL   Globulin, Total 2.1 1.5 - 4.5 g/dL   Bilirubin Total <9.7 0.0 - 1.2 mg/dL   Alkaline Phosphatase 55 49 - 135 IU/L   AST 18 0 - 40 IU/L   ALT 15 0 - 32 IU/L    Assessment & Plan    Community acquired pneumonia, unspecified laterality  Adult hypothyroidism -     TSH Rfx on Abnormal to Free T4  Chronic kidney disease (CKD) stage G2/A2, mildly decreased glomerular filtration rate (GFR) between 60-89 mL/min/1.73 square meter and albuminuria creatinine ratio between 30-299 mg/g -     Comprehensive metabolic panel with GFR  Hypercholesterolemia without hypertriglyceridemia -     Lipid panel  Prediabetes -     Hemoglobin A1c  Elevated blood pressure reading without diagnosis of hypertension     Community-acquired pneumonia, unspecified laterality Recent diagnosis with improvement after azithromycin , doxycycline , and prednisone . Discussed prednisone  side effects and potential future use of inhaled steroids.  - Rechecked blood pressure. - Ordered blood work.  Adult hypothyroidism Increased levothyroxine  to 75 mcg daily in September. - Will recheck thyroid  function tests.  Chronic kidney disease (CKD) stage G2/A2, mildly decreased glomerular filtration rate (GFR) between 60-89 mL/min/1.73 m and albuminuria creatinine ratio between 32-299 mg/g Noted.  Will recheck CMP today.  Continue to optimize risk factors.  Hypercholesterolemia without hypertriglyceridemia Chronic, managed with atorvastatin  20 mg daily.  No acute concerns.  Recheck lipid panel today. - Continue atorvastatin  20 mg daily - Recheck lipid panel today  Elevated blood pressure reading without diagnosis of hypertension  Noted.  Blood pressure normally  well-controlled without medication.  Patient does occasionally experience probable orthostatic hypotension with dizziness upon standing.  Given these facts and patient's advanced age, risk of starting an antihypertensive exceed the benefit.  Will continue to monitor.    No follow-ups on file.      I discussed the assessment and treatment plan with the patient  The patient was provided an opportunity to ask questions and all were answered. The patient agreed with the  plan and demonstrated an understanding of the instructions.   The patient was advised to call back or seek an in-person evaluation if the symptoms worsen or if the condition fails to improve as anticipated.    LAURAINE LOISE BUOY, DO  [place] [facility] [phone] (phone) [phone] (fax)  [facility]     [1]  Outpatient Medications Prior to Visit  Medication Sig   albuterol (VENTOLIN HFA) 108 (90 Base) MCG/ACT inhaler Inhale 2 puffs into the lungs every 6 (six) hours as needed for wheezing or shortness of breath.   aspirin EC 81 MG tablet Take 81 mg by mouth daily. Swallow whole.   atorvastatin  (LIPITOR) 20 MG tablet Take 1 tablet (20 mg total) by mouth daily.   Calcium  Carbonate (CALCIUM  600 PO) Take 600 mg by mouth 2 (two) times a week.    Cholecalciferol (VITAMIN D-3) 25 MCG (1000 UT) CAPS Take 1,000 Units by mouth daily.   doxycycline  (VIBRA -TABS) 100 MG tablet Take 1 tablet (100 mg total) by mouth 2 (two) times daily.   FLUoxetine  (PROZAC ) 20 MG capsule TAKE 2 CAPSULES EVERY DAY   guaiFENesin -codeine  100-10 MG/5ML syrup Take 10 mLs by mouth 3 (three) times daily as needed for cough.   levothyroxine  (SYNTHROID ) 75 MCG tablet Take 1 tablet (75 mcg total) by mouth daily.   Multiple Vitamins-Minerals (EMERGEN-C IMMUNE PO) Take 1 tablet by mouth daily as needed (immunity boost).    omeprazole  (PRILOSEC) 20 MG capsule Take 1 capsule (20 mg total) by mouth daily.   vitamin B-12  (CYANOCOBALAMIN ) 500 MCG tablet Take 500 mcg by mouth daily.   [DISCONTINUED] azithromycin  (ZITHROMAX ) 500 MG tablet Take 1 tablet (500 mg total) by mouth daily.   [DISCONTINUED] predniSONE  (DELTASONE ) 10 MG tablet 6 tablets on Day 1 , then reduce by 1 tablet daily until gone   [DISCONTINUED] gabapentin  (NEURONTIN ) 100 MG capsule Take 1 capsule (100 mg total) by mouth daily. (Patient not taking: Reported on 04/26/2024)   No facility-administered medications prior to visit.   "

## 2024-05-03 LAB — COMPREHENSIVE METABOLIC PANEL WITH GFR
ALT: 15 IU/L (ref 0–32)
AST: 18 IU/L (ref 0–40)
Albumin: 3.8 g/dL (ref 3.8–4.8)
Alkaline Phosphatase: 55 IU/L (ref 49–135)
BUN/Creatinine Ratio: 29 — ABNORMAL HIGH (ref 12–28)
BUN: 16 mg/dL (ref 8–27)
Bilirubin Total: <0.2 mg/dL (ref 0.0–1.2)
CO2: 25 mmol/L (ref 20–29)
Calcium: 9.1 mg/dL (ref 8.7–10.3)
Chloride: 105 mmol/L (ref 96–106)
Creatinine, Ser: 0.56 mg/dL — ABNORMAL LOW (ref 0.57–1.00)
Globulin, Total: 2.1 g/dL (ref 1.5–4.5)
Glucose: 96 mg/dL (ref 70–99)
Potassium: 3.8 mmol/L (ref 3.5–5.2)
Sodium: 141 mmol/L (ref 134–144)
Total Protein: 5.9 g/dL — ABNORMAL LOW (ref 6.0–8.5)
eGFR: 93 mL/min/1.73

## 2024-05-03 LAB — LIPID PANEL
Chol/HDL Ratio: 2.4 ratio (ref 0.0–4.4)
Cholesterol, Total: 161 mg/dL (ref 100–199)
HDL: 68 mg/dL
LDL Chol Calc (NIH): 78 mg/dL (ref 0–99)
Triglycerides: 79 mg/dL (ref 0–149)
VLDL Cholesterol Cal: 15 mg/dL (ref 5–40)

## 2024-05-03 LAB — HEMOGLOBIN A1C
Est. average glucose Bld gHb Est-mCnc: 123 mg/dL
Hgb A1c MFr Bld: 5.9 % — ABNORMAL HIGH (ref 4.8–5.6)

## 2024-05-03 LAB — TSH RFX ON ABNORMAL TO FREE T4: TSH: 1.23 u[IU]/mL (ref 0.450–4.500)

## 2024-05-11 ENCOUNTER — Ambulatory Visit: Payer: Self-pay | Admitting: Internal Medicine

## 2024-05-15 ENCOUNTER — Encounter: Payer: Self-pay | Admitting: Family Medicine

## 2024-05-15 ENCOUNTER — Ambulatory Visit: Payer: Self-pay | Admitting: Family Medicine

## 2024-06-13 ENCOUNTER — Ambulatory Visit

## 2024-06-15 ENCOUNTER — Encounter: Payer: Medicare Other | Admitting: Dermatology

## 2024-06-20 ENCOUNTER — Ambulatory Visit: Admitting: Family Medicine

## 2025-01-03 ENCOUNTER — Ambulatory Visit
